# Patient Record
Sex: Female | Born: 1945 | Race: White | Hispanic: No | Marital: Married | Smoking: Former smoker
Health system: Southern US, Community
[De-identification: ages and names within clinical notes are randomized; demographics above are authoritative.]

## PROBLEM LIST (undated history)

## (undated) ENCOUNTER — Ambulatory Visit (HOSPITAL_BASED_OUTPATIENT_CLINIC_OR_DEPARTMENT_OTHER): Payer: Medicare PPO

## (undated) DIAGNOSIS — I639 Cerebral infarction, unspecified: Secondary | ICD-10-CM

## (undated) HISTORY — DX: Cerebral infarction, unspecified: I63.9

## (undated) HISTORY — PX: TONSILLECTOMY: SUR1361

## (undated) HISTORY — PX: OVARIAN CYST SURGERY: SHX726

## (undated) HISTORY — PX: BREAST EXCISIONAL BIOPSY: SUR124

## (undated) HISTORY — PX: CHOLECYSTECTOMY: SHX55

---

## 1985-03-01 HISTORY — PX: VAGINAL HYSTERECTOMY: SUR661

## 1999-08-12 ENCOUNTER — Encounter: Admission: RE | Admit: 1999-08-12 | Discharge: 1999-08-12 | Payer: Self-pay | Admitting: Surgery

## 1999-08-12 ENCOUNTER — Encounter: Payer: Self-pay | Admitting: Surgery

## 2000-08-30 ENCOUNTER — Encounter: Admission: RE | Admit: 2000-08-30 | Discharge: 2000-08-30 | Payer: Self-pay | Admitting: Surgery

## 2000-08-30 ENCOUNTER — Encounter: Payer: Self-pay | Admitting: Surgery

## 2001-09-04 ENCOUNTER — Encounter: Payer: Self-pay | Admitting: Surgery

## 2001-09-04 ENCOUNTER — Encounter: Admission: RE | Admit: 2001-09-04 | Discharge: 2001-09-04 | Payer: Self-pay | Admitting: Surgery

## 2001-09-22 ENCOUNTER — Other Ambulatory Visit: Admission: RE | Admit: 2001-09-22 | Discharge: 2001-09-22 | Payer: Self-pay | Admitting: Family Medicine

## 2002-09-06 ENCOUNTER — Encounter: Payer: Self-pay | Admitting: Surgery

## 2002-09-06 ENCOUNTER — Encounter: Admission: RE | Admit: 2002-09-06 | Discharge: 2002-09-06 | Payer: Self-pay | Admitting: Surgery

## 2003-09-12 ENCOUNTER — Encounter: Admission: RE | Admit: 2003-09-12 | Discharge: 2003-09-12 | Payer: Self-pay | Admitting: Surgery

## 2003-09-17 ENCOUNTER — Encounter: Admission: RE | Admit: 2003-09-17 | Discharge: 2003-09-17 | Payer: Self-pay | Admitting: Surgery

## 2004-03-01 HISTORY — PX: ROTATOR CUFF REPAIR: SHX139

## 2004-04-20 ENCOUNTER — Ambulatory Visit: Payer: Self-pay | Admitting: Family Medicine

## 2004-04-22 ENCOUNTER — Ambulatory Visit: Payer: Self-pay | Admitting: Family Medicine

## 2004-04-29 ENCOUNTER — Ambulatory Visit: Payer: Self-pay | Admitting: Family Medicine

## 2004-07-29 ENCOUNTER — Ambulatory Visit: Payer: Self-pay | Admitting: Family Medicine

## 2004-09-18 ENCOUNTER — Encounter: Admission: RE | Admit: 2004-09-18 | Discharge: 2004-09-18 | Payer: Self-pay | Admitting: Surgery

## 2004-10-08 ENCOUNTER — Ambulatory Visit: Payer: Self-pay | Admitting: Family Medicine

## 2005-03-08 ENCOUNTER — Ambulatory Visit: Payer: Self-pay | Admitting: Family Medicine

## 2005-04-06 ENCOUNTER — Ambulatory Visit: Payer: Self-pay | Admitting: Family Medicine

## 2005-09-27 ENCOUNTER — Encounter: Admission: RE | Admit: 2005-09-27 | Discharge: 2005-09-27 | Payer: Self-pay | Admitting: Surgery

## 2006-03-01 HISTORY — PX: ROTATOR CUFF REPAIR: SHX139

## 2006-09-29 ENCOUNTER — Encounter: Admission: RE | Admit: 2006-09-29 | Discharge: 2006-09-29 | Payer: Self-pay | Admitting: Surgery

## 2007-10-02 ENCOUNTER — Encounter: Admission: RE | Admit: 2007-10-02 | Discharge: 2007-10-02 | Payer: Self-pay | Admitting: Family Medicine

## 2008-10-02 ENCOUNTER — Encounter: Admission: RE | Admit: 2008-10-02 | Discharge: 2008-10-02 | Payer: Self-pay | Admitting: Family Medicine

## 2009-10-07 ENCOUNTER — Encounter: Admission: RE | Admit: 2009-10-07 | Discharge: 2009-10-07 | Payer: Self-pay | Admitting: Family Medicine

## 2010-09-07 ENCOUNTER — Other Ambulatory Visit (INDEPENDENT_AMBULATORY_CARE_PROVIDER_SITE_OTHER): Payer: Self-pay | Admitting: Surgery

## 2010-09-07 ENCOUNTER — Other Ambulatory Visit: Payer: Self-pay | Admitting: Family Medicine

## 2010-09-07 DIAGNOSIS — Z1231 Encounter for screening mammogram for malignant neoplasm of breast: Secondary | ICD-10-CM

## 2010-10-09 ENCOUNTER — Ambulatory Visit: Payer: Self-pay

## 2010-10-12 ENCOUNTER — Ambulatory Visit
Admission: RE | Admit: 2010-10-12 | Discharge: 2010-10-12 | Disposition: A | Discharge status: Home / Self Care | Payer: BC Managed Care – PPO | Source: Ambulatory Visit | Attending: Surgery | Admitting: Surgery

## 2010-10-12 DIAGNOSIS — Z1231 Encounter for screening mammogram for malignant neoplasm of breast: Secondary | ICD-10-CM

## 2011-09-27 ENCOUNTER — Other Ambulatory Visit: Payer: Self-pay | Admitting: Family Medicine

## 2011-09-27 DIAGNOSIS — Z1231 Encounter for screening mammogram for malignant neoplasm of breast: Secondary | ICD-10-CM

## 2011-10-13 ENCOUNTER — Ambulatory Visit
Admission: RE | Admit: 2011-10-13 | Discharge: 2011-10-13 | Disposition: A | Discharge status: Home / Self Care | Payer: BC Managed Care – PPO | Source: Ambulatory Visit | Attending: Family Medicine | Admitting: Family Medicine

## 2011-10-13 DIAGNOSIS — Z1231 Encounter for screening mammogram for malignant neoplasm of breast: Secondary | ICD-10-CM

## 2012-09-15 ENCOUNTER — Other Ambulatory Visit: Payer: Self-pay

## 2012-09-15 DIAGNOSIS — Z1231 Encounter for screening mammogram for malignant neoplasm of breast: Secondary | ICD-10-CM

## 2012-09-25 DIAGNOSIS — M5416 Radiculopathy, lumbar region: Secondary | ICD-10-CM

## 2012-09-25 HISTORY — DX: Radiculopathy, lumbar region: M54.16

## 2012-10-18 ENCOUNTER — Ambulatory Visit: Payer: BC Managed Care – PPO

## 2012-11-15 ENCOUNTER — Ambulatory Visit
Admission: RE | Admit: 2012-11-15 | Discharge: 2012-11-15 | Disposition: A | Discharge status: Home / Self Care | Payer: Medicare Other | Source: Ambulatory Visit

## 2012-11-15 DIAGNOSIS — Z1231 Encounter for screening mammogram for malignant neoplasm of breast: Secondary | ICD-10-CM

## 2013-10-11 ENCOUNTER — Other Ambulatory Visit: Payer: Self-pay

## 2013-10-11 DIAGNOSIS — Z1231 Encounter for screening mammogram for malignant neoplasm of breast: Secondary | ICD-10-CM

## 2013-11-16 ENCOUNTER — Encounter (INDEPENDENT_AMBULATORY_CARE_PROVIDER_SITE_OTHER): Payer: Self-pay

## 2013-11-16 ENCOUNTER — Ambulatory Visit
Admission: RE | Admit: 2013-11-16 | Discharge: 2013-11-16 | Disposition: A | Discharge status: Home / Self Care | Payer: 59 | Source: Ambulatory Visit

## 2013-11-16 DIAGNOSIS — Z1231 Encounter for screening mammogram for malignant neoplasm of breast: Secondary | ICD-10-CM

## 2014-10-22 ENCOUNTER — Other Ambulatory Visit: Payer: Self-pay

## 2014-10-22 DIAGNOSIS — Z1231 Encounter for screening mammogram for malignant neoplasm of breast: Secondary | ICD-10-CM

## 2014-11-06 ENCOUNTER — Other Ambulatory Visit: Payer: Self-pay | Admitting: Family Medicine

## 2014-11-06 DIAGNOSIS — N6315 Unspecified lump in the right breast, overlapping quadrants: Secondary | ICD-10-CM

## 2014-11-06 DIAGNOSIS — N644 Mastodynia: Secondary | ICD-10-CM

## 2014-11-06 DIAGNOSIS — N6310 Unspecified lump in the right breast, unspecified quadrant: Secondary | ICD-10-CM

## 2014-11-12 ENCOUNTER — Ambulatory Visit
Admission: RE | Admit: 2014-11-12 | Discharge: 2014-11-12 | Disposition: A | Discharge status: Home / Self Care | Payer: Medicare Other | Source: Ambulatory Visit | Attending: Family Medicine | Admitting: Family Medicine

## 2014-11-12 DIAGNOSIS — N644 Mastodynia: Secondary | ICD-10-CM

## 2014-11-12 DIAGNOSIS — N6315 Unspecified lump in the right breast, overlapping quadrants: Secondary | ICD-10-CM

## 2014-11-12 DIAGNOSIS — N6310 Unspecified lump in the right breast, unspecified quadrant: Secondary | ICD-10-CM

## 2014-11-19 ENCOUNTER — Ambulatory Visit: Payer: Self-pay

## 2015-05-18 DIAGNOSIS — E782 Mixed hyperlipidemia: Secondary | ICD-10-CM | POA: Insufficient documentation

## 2015-05-18 HISTORY — DX: Mixed hyperlipidemia: E78.2

## 2015-05-21 DIAGNOSIS — G4700 Insomnia, unspecified: Secondary | ICD-10-CM

## 2015-05-21 HISTORY — DX: Insomnia, unspecified: G47.00

## 2015-05-22 DIAGNOSIS — M5136 Other intervertebral disc degeneration, lumbar region: Secondary | ICD-10-CM

## 2015-05-22 DIAGNOSIS — G894 Chronic pain syndrome: Secondary | ICD-10-CM

## 2015-05-22 DIAGNOSIS — M51369 Other intervertebral disc degeneration, lumbar region without mention of lumbar back pain or lower extremity pain: Secondary | ICD-10-CM

## 2015-05-22 HISTORY — DX: Other intervertebral disc degeneration, lumbar region: M51.36

## 2015-05-22 HISTORY — DX: Other intervertebral disc degeneration, lumbar region without mention of lumbar back pain or lower extremity pain: M51.369

## 2015-05-22 HISTORY — DX: Chronic pain syndrome: G89.4

## 2015-07-21 DIAGNOSIS — D509 Iron deficiency anemia, unspecified: Secondary | ICD-10-CM

## 2015-07-21 DIAGNOSIS — I10 Essential (primary) hypertension: Secondary | ICD-10-CM

## 2015-07-21 DIAGNOSIS — G454 Transient global amnesia: Secondary | ICD-10-CM

## 2015-07-21 HISTORY — DX: Iron deficiency anemia, unspecified: D50.9

## 2015-07-21 HISTORY — DX: Transient global amnesia: G45.4

## 2015-07-21 HISTORY — DX: Essential (primary) hypertension: I10

## 2015-09-15 DIAGNOSIS — G589 Mononeuropathy, unspecified: Secondary | ICD-10-CM

## 2015-09-15 DIAGNOSIS — I739 Peripheral vascular disease, unspecified: Secondary | ICD-10-CM

## 2015-09-15 DIAGNOSIS — J309 Allergic rhinitis, unspecified: Secondary | ICD-10-CM

## 2015-09-15 DIAGNOSIS — K5730 Diverticulosis of large intestine without perforation or abscess without bleeding: Secondary | ICD-10-CM | POA: Insufficient documentation

## 2015-09-15 DIAGNOSIS — N6019 Diffuse cystic mastopathy of unspecified breast: Secondary | ICD-10-CM

## 2015-09-15 DIAGNOSIS — Z13820 Encounter for screening for osteoporosis: Secondary | ICD-10-CM

## 2015-09-15 DIAGNOSIS — Z0000 Encounter for general adult medical examination without abnormal findings: Secondary | ICD-10-CM

## 2015-09-15 DIAGNOSIS — M129 Arthropathy, unspecified: Secondary | ICD-10-CM

## 2015-09-15 HISTORY — DX: Encounter for screening for osteoporosis: Z13.820

## 2015-09-15 HISTORY — DX: Arthropathy, unspecified: M12.9

## 2015-09-15 HISTORY — DX: Mononeuropathy, unspecified: G58.9

## 2015-09-15 HISTORY — DX: Encounter for general adult medical examination without abnormal findings: Z00.00

## 2015-09-15 HISTORY — DX: Peripheral vascular disease, unspecified: I73.9

## 2015-09-15 HISTORY — DX: Diverticulosis of large intestine without perforation or abscess without bleeding: K57.30

## 2015-09-15 HISTORY — DX: Allergic rhinitis, unspecified: J30.9

## 2015-09-15 HISTORY — DX: Diffuse cystic mastopathy of unspecified breast: N60.19

## 2015-10-14 ENCOUNTER — Other Ambulatory Visit: Payer: Self-pay | Admitting: Family Medicine

## 2015-10-14 DIAGNOSIS — Z1231 Encounter for screening mammogram for malignant neoplasm of breast: Secondary | ICD-10-CM

## 2015-11-14 ENCOUNTER — Ambulatory Visit: Payer: Medicare Other

## 2015-11-21 ENCOUNTER — Ambulatory Visit
Admission: RE | Admit: 2015-11-21 | Discharge: 2015-11-21 | Disposition: A | Discharge status: Home / Self Care | Payer: Medicare Other | Source: Ambulatory Visit | Attending: Family Medicine | Admitting: Family Medicine

## 2015-11-21 DIAGNOSIS — Z1231 Encounter for screening mammogram for malignant neoplasm of breast: Secondary | ICD-10-CM

## 2016-03-21 DIAGNOSIS — E669 Obesity, unspecified: Secondary | ICD-10-CM

## 2016-03-21 HISTORY — DX: Obesity, unspecified: E66.9

## 2016-10-08 DIAGNOSIS — J392 Other diseases of pharynx: Secondary | ICD-10-CM

## 2016-10-08 HISTORY — DX: Other diseases of pharynx: J39.2

## 2016-10-11 ENCOUNTER — Other Ambulatory Visit: Payer: Self-pay | Admitting: Family Medicine

## 2016-10-11 DIAGNOSIS — Z1231 Encounter for screening mammogram for malignant neoplasm of breast: Secondary | ICD-10-CM

## 2016-11-22 ENCOUNTER — Ambulatory Visit
Admission: RE | Admit: 2016-11-22 | Discharge: 2016-11-22 | Disposition: A | Discharge status: Home / Self Care | Payer: Medicare Other | Source: Ambulatory Visit | Attending: Family Medicine | Admitting: Family Medicine

## 2016-11-22 DIAGNOSIS — Z1231 Encounter for screening mammogram for malignant neoplasm of breast: Secondary | ICD-10-CM

## 2017-10-10 ENCOUNTER — Other Ambulatory Visit: Payer: Self-pay | Admitting: Family Medicine

## 2017-10-10 DIAGNOSIS — Z1231 Encounter for screening mammogram for malignant neoplasm of breast: Secondary | ICD-10-CM

## 2017-10-12 ENCOUNTER — Other Ambulatory Visit: Payer: Self-pay | Admitting: Family Medicine

## 2017-10-12 DIAGNOSIS — N959 Unspecified menopausal and perimenopausal disorder: Secondary | ICD-10-CM

## 2017-10-13 ENCOUNTER — Other Ambulatory Visit: Payer: Self-pay | Admitting: Family Medicine

## 2017-10-13 DIAGNOSIS — E2839 Other primary ovarian failure: Secondary | ICD-10-CM

## 2017-11-23 ENCOUNTER — Ambulatory Visit
Admission: RE | Admit: 2017-11-23 | Discharge: 2017-11-23 | Disposition: A | Discharge status: Home / Self Care | Payer: Medicare Other | Source: Ambulatory Visit | Attending: Family Medicine | Admitting: Family Medicine

## 2017-11-23 DIAGNOSIS — Z1231 Encounter for screening mammogram for malignant neoplasm of breast: Secondary | ICD-10-CM

## 2017-12-07 ENCOUNTER — Other Ambulatory Visit: Payer: Medicare Other

## 2018-04-12 DIAGNOSIS — Z8249 Family history of ischemic heart disease and other diseases of the circulatory system: Secondary | ICD-10-CM

## 2018-04-12 HISTORY — DX: Family history of ischemic heart disease and other diseases of the circulatory system: Z82.49

## 2018-10-18 ENCOUNTER — Other Ambulatory Visit: Payer: Self-pay | Admitting: Family Medicine

## 2018-10-18 DIAGNOSIS — Z1231 Encounter for screening mammogram for malignant neoplasm of breast: Secondary | ICD-10-CM

## 2018-10-20 DIAGNOSIS — R7303 Prediabetes: Secondary | ICD-10-CM

## 2018-10-20 HISTORY — DX: Prediabetes: R73.03

## 2018-10-25 DIAGNOSIS — R0600 Dyspnea, unspecified: Secondary | ICD-10-CM

## 2018-10-25 DIAGNOSIS — R0609 Other forms of dyspnea: Secondary | ICD-10-CM | POA: Insufficient documentation

## 2018-10-25 HISTORY — DX: Dyspnea, unspecified: R06.00

## 2018-10-25 HISTORY — DX: Other forms of dyspnea: R06.09

## 2018-12-05 ENCOUNTER — Ambulatory Visit
Admission: RE | Admit: 2018-12-05 | Discharge: 2018-12-05 | Disposition: A | Discharge status: Home / Self Care | Payer: Medicare Other | Source: Ambulatory Visit | Attending: Family Medicine | Admitting: Family Medicine

## 2018-12-05 ENCOUNTER — Other Ambulatory Visit: Payer: Self-pay

## 2018-12-05 DIAGNOSIS — Z1231 Encounter for screening mammogram for malignant neoplasm of breast: Secondary | ICD-10-CM

## 2018-12-06 ENCOUNTER — Other Ambulatory Visit: Payer: Self-pay | Admitting: Family Medicine

## 2018-12-06 DIAGNOSIS — R928 Other abnormal and inconclusive findings on diagnostic imaging of breast: Secondary | ICD-10-CM

## 2018-12-06 DIAGNOSIS — L0291 Cutaneous abscess, unspecified: Secondary | ICD-10-CM

## 2018-12-08 ENCOUNTER — Ambulatory Visit: Payer: Medicare Other

## 2018-12-08 ENCOUNTER — Other Ambulatory Visit: Payer: Medicare Other

## 2018-12-08 ENCOUNTER — Other Ambulatory Visit: Payer: Self-pay

## 2018-12-08 ENCOUNTER — Ambulatory Visit
Admission: RE | Admit: 2018-12-08 | Discharge: 2018-12-08 | Disposition: A | Discharge status: Home / Self Care | Payer: Medicare Other | Source: Ambulatory Visit | Attending: Family Medicine | Admitting: Family Medicine

## 2018-12-08 DIAGNOSIS — R928 Other abnormal and inconclusive findings on diagnostic imaging of breast: Secondary | ICD-10-CM

## 2019-01-15 DIAGNOSIS — B029 Zoster without complications: Secondary | ICD-10-CM

## 2019-01-15 HISTORY — DX: Zoster without complications: B02.9

## 2019-05-04 ENCOUNTER — Encounter: Payer: Self-pay | Admitting: *Deleted

## 2019-05-04 ENCOUNTER — Encounter: Payer: Self-pay | Admitting: Cardiology

## 2019-05-08 ENCOUNTER — Encounter (INDEPENDENT_AMBULATORY_CARE_PROVIDER_SITE_OTHER): Payer: Self-pay

## 2019-05-08 ENCOUNTER — Encounter: Payer: Self-pay | Admitting: Cardiology

## 2019-05-08 ENCOUNTER — Ambulatory Visit: Payer: Medicare PPO | Admitting: Cardiology

## 2019-05-08 ENCOUNTER — Other Ambulatory Visit: Payer: Self-pay

## 2019-05-08 VITALS — BP 160/70 | HR 60 | Ht 65.0 in | Wt 207.0 lb

## 2019-05-08 DIAGNOSIS — E663 Overweight: Secondary | ICD-10-CM

## 2019-05-08 DIAGNOSIS — I10 Essential (primary) hypertension: Secondary | ICD-10-CM

## 2019-05-08 DIAGNOSIS — R0600 Dyspnea, unspecified: Secondary | ICD-10-CM | POA: Diagnosis not present

## 2019-05-08 DIAGNOSIS — G454 Transient global amnesia: Secondary | ICD-10-CM | POA: Diagnosis not present

## 2019-05-08 DIAGNOSIS — R7303 Prediabetes: Secondary | ICD-10-CM | POA: Diagnosis not present

## 2019-05-08 DIAGNOSIS — R0609 Other forms of dyspnea: Secondary | ICD-10-CM

## 2019-05-08 DIAGNOSIS — R0989 Other specified symptoms and signs involving the circulatory and respiratory systems: Secondary | ICD-10-CM

## 2019-05-08 DIAGNOSIS — R011 Cardiac murmur, unspecified: Secondary | ICD-10-CM

## 2019-05-08 HISTORY — DX: Overweight: E66.3

## 2019-05-08 HISTORY — DX: Essential (primary) hypertension: I10

## 2019-05-08 MED ORDER — LOSARTAN POTASSIUM 100 MG PO TABS: 50.0000 mg | ORAL_TABLET | Freq: Every day | ORAL | 3 refills | Status: DC

## 2019-05-08 NOTE — Patient Instructions (Signed)
Medication Instructions:  Your physician has recommended you make the following change in your medication:   Stop Hydralazine Start Losartan 100 mg take 0.5 (1/2) tablet daily.  *If you need a refill on your cardiac medications before your next appointment, please call your pharmacy*   Lab Work: You need to have labs done in 2 weeks.  You can come Monday through Friday 8:30 am to 12:00 pm and 1:15 to 4:30. You do not need to make an appointment as the order has already been placed. The labs you are going to have done is a  BMET. If you have labs (blood work) drawn today and your tests are completely normal, you will receive your results only by: Marland Kitchen MyChart Message (if you have MyChart) OR . A paper copy in the mail If you have any lab test that is abnormal or we need to change your treatment, we will call you to review the results.   Testing/Procedures: Your physician has requested that you have an echocardiogram. Echocardiography is a painless test that uses sound waves to create images of your heart. It provides your doctor with information about the size and shape of your heart and how well your heart's chambers and valves are working. This procedure takes approximately one hour. There are no restrictions for this procedure.  Your physician has requested that you have a lexiscan myoview. For further information please visit [email or web address]. Please follow instruction sheet, as given.  The test will take approximately 3 to 4 hours to complete; you may bring reading material.  If someone comes with you to your appointment, they will need to remain in the main lobby due to limited space in the testing area. **If you are pregnant or breastfeeding, please notify the nuclear lab prior to your appointment**  How to prepare for your Myocardial Perfusion Test: . Do not eat or drink 3 hours prior to your test, except you may have water. . Do not consume products containing caffeine (regular or  decaffeinated) 12 hours prior to your test. (ex: coffee, chocolate, sodas, tea). Do not take metoprolol (Lopressor, Toprol) for 24 hours prior to the test.  Bring the medication to your appointment as you may be required to take it once the test is complete. . Do wear comfortable clothes (no dresses or overalls) and walking shoes, tennis shoes preferred (No heels or open toe shoes are allowed). . Do NOT wear cologne, perfume, aftershave, or lotions (deodorant is allowed). . If these instructions are not followed, your test will have to be rescheduled.    Follow-Up: At [facility], you and your health needs are our priority.  As part of our continuing mission to provide you with exceptional heart care, we have created designated Provider Care Teams.  These Care Teams include your primary Cardiologist (physician) and Advanced Practice Providers (APPs -  Physician Assistants and Nurse Practitioners) who all work together to provide you with the care you need, when you need it.  We recommend signing up for the patient portal called "MyChart".  Sign up information is provided on this After Visit Summary.  MyChart is used to connect with patients for Virtual Visits (Telemedicine).  Patients are able to view lab/test results, encounter notes, upcoming appointments, etc.  Non-urgent messages can be sent to your provider as well.   To learn more about what you can do with MyChart, go to [email or web address].    Your next appointment:   2 month(s)  The format for your next appointment:  In Person  Provider:   Jyl Heinz, MD   Other Instructions  Echocardiogram An echocardiogram is a procedure that uses painless sound waves (ultrasound) to produce an image of the heart. Images from an echocardiogram can provide important information about:  Signs of coronary artery disease (CAD).  Aneurysm detection. An aneurysm is a weak or damaged part of an artery wall that bulges out from the  normal force of blood pumping through the body.  Heart size and shape. Changes in the size or shape of the heart can be associated with certain conditions, including heart failure, aneurysm, and CAD.  Heart muscle function.  Heart valve function.  Signs of a past heart attack.  Fluid buildup around the heart.  Thickening of the heart muscle.  A tumor or infectious growth around the heart valves. Tell a health care provider about:  Any allergies you have.  All medicines you are taking, including vitamins, herbs, eye drops, creams, and over-the-counter medicines.  Any blood disorders you have.  Any surgeries you have had.  Any medical conditions you have.  Whether you are pregnant or may be pregnant. What are the risks? Generally, this is a safe procedure. However, problems may occur, including:  Allergic reaction to dye (contrast) that may be used during the procedure. What happens before the procedure? No specific preparation is needed. You may eat and drink normally. What happens during the procedure?   An IV tube may be inserted into one of your veins.  You may receive contrast through this tube. A contrast is an injection that improves the quality of the pictures from your heart.  A gel will be applied to your chest.  A wand-like tool (transducer) will be moved over your chest. The gel will help to transmit the sound waves from the transducer.  The sound waves will harmlessly bounce off of your heart to allow the heart images to be captured in real-time motion. The images will be recorded on a computer. The procedure may vary among health care providers and hospitals. What happens after the procedure?  You may return to your normal, everyday life, including diet, activities, and medicines, unless your health care provider tells you not to do that. Summary  An echocardiogram is a procedure that uses painless sound waves (ultrasound) to produce an image of the  heart.  Images from an echocardiogram can provide important information about the size and shape of your heart, heart muscle function, heart valve function, and fluid buildup around your heart.  You do not need to do anything to prepare before this procedure. You may eat and drink normally.  After the echocardiogram is completed, you may return to your normal, everyday life, unless your health care provider tells you not to do that. This information is not intended to replace advice given to you by your health care provider. Make sure you discuss any questions you have with your health care provider. Document Revised: 06/08/2018 Document Reviewed: 03/20/2016 Elsevier Patient Education  [place].

## 2019-05-08 NOTE — Progress Notes (Signed)
Cardiology Office Note:    Date:  05/08/2019   ID:  Rhodia Albright [place], [place] 09/14/1945, MRN [id number]  PCP:  Algis Greenhouse, MD  Cardiologist:  Jenean Lindau, MD   Referring MD: Algis Greenhouse, MD    ASSESSMENT:    1. Dyspnea on exertion   2. Transient global amnesia   3. Prediabetes   4. Essential hypertension   5. Overweight    PLAN:    In order of problems listed above:  1. Primary prevention stressed with the patient.  Importance of compliance with diet medication stressed and she vocalized understanding.  Importance of regular exercise to the best of her ability weight reduction was stressed.  Diet was emphasized. 2. Essential hypertension: Blood pressure is mildly elevated.  She is taking hydralazine 4 times a day which is kind of cumbersome and difficult for her so I will switch this medication to losartan and will start with 100 mg half tablet daily.  She will be back in 2 weeks for Chem-7.  At that time she will bring in a log of her blood pressure checks. 3. Cardiac murmur: Echocardiogram will be done to assess murmur on auscultation. 4. Shortness of breath on exertion: Patient will undergo Lexiscan sestamibi to assess the symptoms. 5. Bilateral carotid bruits: Doppler ultrasound will be done to assess this.  She has history of TIAs in the past. 6. Mixed dyslipidemia: Patient on statin therapy diet emphasized and lipids followed by primary care physician. 7. Patient will be seen in follow-up appointment in 2 months or earlier if the patient has any concerns    Medication Adjustments/Labs and Tests Ordered: Current medicines are reviewed at length with the patient today.  Concerns regarding medicines are outlined above.  Orders Placed This Encounter  Procedures  . Ambulatory referral to Cardiology   No orders of the defined types were placed in this encounter.    History of Present Illness:    Alisha Cochran is a 74 y.o. female who is being seen  today for the evaluation of essential hypertension at the request of Dough, Jaymes Graff, MD.  Patient is a pleasant 74 year old female.  She has past medical history of essential hypertension dyslipidemia and diet-controlled diabetes mellitus.  She is overweight.  She has had history of TIAs in the past.  She does not exercise much because of orthopedic issues with her back.  She denies any chest pain orthopnea or PND.  Patient does mention to me that she has shortness of breath on exertion.  Past Medical History:  Diagnosis Date  . Allergic rhinitis 09/15/2015  . Anemia, iron deficiency 07/21/2015  . Arthropathy 09/15/2015  . Benign hypertension 07/21/2015  . Chronic pain syndrome 05/22/2015  . Diverticulosis of colon 09/15/2015   2014, 2020: diverticulitis  . Dyspnea on exertion 10/25/2018   2020  . Family history of premature coronary artery disease 04/12/2018  . Fibrocystic breast disease 09/15/2015  . Herpes zoster without complication 123456   2020: left T12  . Hyperlipemia, mixed 05/18/2015  . Insomnia 05/21/2015  . 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 Obesity (BMI 30-39.9) 03/21/2016  . 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 Peripheral arterial disease ([place]) 09/15/2015   Overview:  2009: right CFA 50%, asx 2017: insurance nurse left 0.86 right 0.94  . Pharyngeal lesion 10/08/2016   Overview:  2018: right  . Prediabetes 10/20/2018  . Radiculopathy of lumbar region 09/25/2012   Overview:  2008: onset 2009: MRI DDD with tear and protrusion L5-S1, facet arthropathy L5-S1 2014: MRI L4-5 DDD with progression of L5-S1 changes 2014: Pain eval, limited  . Stroke ([place])   . Transient global amnesia 07/21/2015    Past Surgical History:  Procedure Laterality Date  . BREAST EXCISIONAL BIOPSY Right    x2  . BREAST EXCISIONAL BIOPSY Left    x2  . CHOLECYSTECTOMY    . OVARIAN CYST SURGERY    . ROTATOR CUFF REPAIR Right 2008  . ROTATOR CUFF REPAIR  Left 2006  . TONSILLECTOMY    . VAGINAL HYSTERECTOMY  1987    Current Medications: Current Meds  Medication Sig  . amLODipine (NORVASC) 5 MG tablet Take 5 mg by mouth daily.  Marland Kitchen aspirin 81 MG EC tablet Take 81 mg by mouth daily.  Marland Kitchen doxazosin (CARDURA) 2 MG tablet 2 mg every evening.  . metoprolol tartrate (LOPRESSOR) 50 MG tablet 50 mg every 12 (twelve) hours.  . rosuvastatin (CRESTOR) 40 MG tablet 40 mg daily.  . [DISCONTINUED] hydrALAZINE (APRESOLINE) 50 MG tablet Take 50 mg by mouth in the morning, at noon, in the evening, and at bedtime.     Allergies:   Lisinopril   Social History   Socioeconomic History  . Marital status: Married    Spouse name: Not on file  . Number of children: Not on file  . Years of education: Not on file  . Highest education level: Not on file  Occupational History  . Not on file  Tobacco Use  . Smoking status: Former Smoker    Types: Cigarettes    Start date: 1966    Quit date: 03/01/1978    Years since quitting: 41.2  . Smokeless tobacco: Never Used  Substance and Sexual Activity  . Alcohol use: Yes    Alcohol/week: 14.0 standard drinks    Types: 14 Glasses of wine per week  . Drug use: Never  . Sexual activity: Not on file  Other Topics Concern  . Not on file  Social History Narrative  . Not on file   Social Determinants of Health   Financial Resource Strain:   . Difficulty of Paying Living Expenses: Not on file  Food Insecurity:   . Worried About Charity fundraiser in the Last Year: Not on file  . Ran Out of Food in the Last Year: Not on file  Transportation Needs:   . Lack of Transportation (Medical): Not on file  . Lack of Transportation (Non-Medical): Not on file  Physical Activity:   . Days of Exercise per Week: Not on file  . Minutes of Exercise per Session: Not on file  Stress:   . Feeling of Stress : Not on file  Social Connections:   . Frequency of Communication with Friends and Family: Not on file  . Frequency of  Social Gatherings with Friends and Family: Not on file  . Attends Religious Services: Not on file  . Active Member of Clubs or Organizations: Not on file  . Attends Archivist Meetings: Not on file  . Marital Status: Not on file     Family History: The patient's family history includes Breast cancer in her maternal grandmother, mother, and paternal grandmother; Heart attack in her paternal grandfather and paternal grandmother; Heart disease in her father.  ROS:   Please see the history of present illness.    All other systems reviewed and are negative.  EKGs/Labs/Other Studies Reviewed:    The following  studies were reviewed today: EKG reveals sinus rhythm and nonspecific ST-T changes.   Recent Labs: No results found for requested labs within last 8760 hours.  Recent Lipid Panel No results found for: CHOL, TRIG, HDL, CHOLHDL, VLDL, LDLCALC, LDLDIRECT  Physical Exam:    VS:  BP (!) 160/70   Pulse 60   Ht 5\' 5"  (1.651 m)   Wt 207 lb (93.9 kg)   SpO2 98%   BMI 34.45 kg/m     Wt Readings from Last 3 Encounters:  05/08/19 207 lb (93.9 kg)     GEN: Patient is in no acute distress HEENT: Normal NECK: No JVD; No carotid bruits LYMPHATICS: No lymphadenopathy CARDIAC: S1 S2 regular, 2/6 systolic murmur at the apex. RESPIRATORY:  Clear to auscultation without rales, wheezing or rhonchi  ABDOMEN: Soft, non-tender, non-distended MUSCULOSKELETAL:  No edema; No deformity  SKIN: Warm and dry NEUROLOGIC:  Alert and oriented x 3 PSYCHIATRIC:  Normal affect    Signed, Jenean Lindau, MD  05/08/2019 3:03 PM    [facility]

## 2019-05-24 LAB — BASIC METABOLIC PANEL
BUN/Creatinine Ratio: 16 (ref 12–28)
BUN: 14 mg/dL (ref 8–27)
CO2: 24 mmol/L (ref 20–29)
Calcium: 9.5 mg/dL (ref 8.7–10.3)
Chloride: 101 mmol/L (ref 96–106)
Creatinine, Ser: 0.86 mg/dL (ref 0.57–1.00)
GFR calc Af Amer: 79 mL/min/{1.73_m2} (ref >59)
GFR calc non Af Amer: 68 mL/min/{1.73_m2} (ref >59)
Glucose: 108 mg/dL — ABNORMAL HIGH (ref 65–99)
Potassium: 4.7 mmol/L (ref 3.5–5.2)
Sodium: 138 mmol/L (ref 134–144)

## 2019-06-14 ENCOUNTER — Telehealth (HOSPITAL_COMMUNITY): Payer: Self-pay | Admitting: *Deleted

## 2019-06-14 NOTE — Telephone Encounter (Signed)
Patient given detailed instructions per Myocardial Perfusion Study Information Sheet for the test on 06/21/19 at 11:00. Patient notified to arrive 15 minutes early and that it is imperative to arrive on time for appointment to keep from having the test rescheduled.  If you need to cancel or reschedule your appointment, please call the office within 24 hours of your appointment. . Patient verbalized understanding.Alisha Cochran

## 2019-06-20 ENCOUNTER — Ambulatory Visit (INDEPENDENT_AMBULATORY_CARE_PROVIDER_SITE_OTHER): Payer: Medicare PPO

## 2019-06-20 ENCOUNTER — Other Ambulatory Visit: Payer: Self-pay

## 2019-06-20 DIAGNOSIS — R011 Cardiac murmur, unspecified: Secondary | ICD-10-CM | POA: Diagnosis not present

## 2019-06-20 DIAGNOSIS — R0989 Other specified symptoms and signs involving the circulatory and respiratory systems: Secondary | ICD-10-CM

## 2019-06-20 NOTE — Progress Notes (Signed)
Carotid duplex has been performed.  Jimmy Diedre Maclellan RDCS, RVT

## 2019-06-20 NOTE — Progress Notes (Signed)
Complete echocardiogram has been performed.  Jimmy Simra Fiebig RDCS, RVT 

## 2019-06-21 ENCOUNTER — Ambulatory Visit (INDEPENDENT_AMBULATORY_CARE_PROVIDER_SITE_OTHER): Payer: Medicare PPO

## 2019-06-21 VITALS — Ht 65.0 in | Wt 207.0 lb

## 2019-06-21 DIAGNOSIS — R0600 Dyspnea, unspecified: Secondary | ICD-10-CM

## 2019-06-21 DIAGNOSIS — R0609 Other forms of dyspnea: Secondary | ICD-10-CM

## 2019-06-21 LAB — MYOCARDIAL PERFUSION IMAGING
LV dias vol: 66 mL (ref 46–106)
LV sys vol: 18 mL
Peak HR: 108 {beats}/min
Rest HR: 72 {beats}/min
SDS: 0
SRS: 0
SSS: 0
TID: 1.16

## 2019-06-21 MED ORDER — REGADENOSON 0.4 MG/5ML IV SOLN
0.4000 mg | Freq: Once | INTRAVENOUS | Status: AC
  Administered 2019-06-21: 0.4 mg via INTRAVENOUS

## 2019-06-21 MED ORDER — TECHNETIUM TC 99M TETROFOSMIN IV KIT
32.3000 | PACK | Freq: Once | INTRAVENOUS | Status: AC | PRN
  Administered 2019-06-21: 32.3 via INTRAVENOUS

## 2019-06-21 MED ORDER — TECHNETIUM TC 99M TETROFOSMIN IV KIT
9.6000 | PACK | Freq: Once | INTRAVENOUS | Status: AC | PRN
  Administered 2019-06-21: 9.6 via INTRAVENOUS

## 2019-06-22 DIAGNOSIS — I6522 Occlusion and stenosis of left carotid artery: Secondary | ICD-10-CM

## 2019-06-22 HISTORY — DX: Occlusion and stenosis of left carotid artery: I65.22

## 2019-07-11 ENCOUNTER — Other Ambulatory Visit: Payer: Self-pay

## 2019-07-12 ENCOUNTER — Ambulatory Visit: Payer: Medicare PPO | Admitting: Cardiology

## 2019-07-12 ENCOUNTER — Other Ambulatory Visit: Payer: Self-pay

## 2019-07-12 ENCOUNTER — Encounter: Payer: Self-pay | Admitting: Cardiology

## 2019-07-12 VITALS — BP 134/60 | HR 66 | Ht 65.0 in | Wt 202.0 lb

## 2019-07-12 DIAGNOSIS — I10 Essential (primary) hypertension: Secondary | ICD-10-CM

## 2019-07-12 NOTE — Progress Notes (Signed)
Cardiology Office Note:    Date:  07/12/2019   ID:  Alisha Cochran Alisha Cochran, [place] 1945/08/13, MRN [id number]  PCP:  Algis Greenhouse, MD  Cardiologist:  Jenean Lindau, MD   Referring MD: Algis Greenhouse, MD    ASSESSMENT:    No diagnosis found. PLAN:    In order of problems listed above:  1. Primary prevention stressed with the patient.  Importance of compliance with diet medication stressed and she vocalized understanding.  Importance of regular exercise stressed and weight reduction stressed.  Diet was emphasized. 2. Essential hypertension: Blood pressure is stable.  Blood multiple blood pressure readings from home and they are fine. 3. Mixed dyslipidemia: Lipids managed by primary care physician.  She is on statin therapy. 4. Because of multiple medication she will have a Chem-7 today. 5. Patient will be seen in follow-up appointment in 6 months or earlier if the patient has any concerns    Medication Adjustments/Labs and Tests Ordered: Current medicines are reviewed at length with the patient today.  Concerns regarding medicines are outlined above.  No orders of the defined types were placed in this encounter.  No orders of the defined types were placed in this encounter.    No chief complaint on file.    History of Present Illness:    Alisha Cochran is a 74 y.o. female.  Patient has past medical history of essential hypertension, vascular atherosclerosis and dyslipidemia.  She denies any problems at this time and takes care of activities of daily living.  No chest pain orthopnea or PND.  At the time of my evaluation, the patient is alert awake oriented and in no distress.  Past Medical History:  Diagnosis Date  . Allergic rhinitis 09/15/2015  . Anemia, iron deficiency 07/21/2015  . Arthropathy 09/15/2015  . Benign hypertension 07/21/2015  . Chronic pain syndrome 05/22/2015  . Diverticulosis of colon 09/15/2015   2014, 2020: diverticulitis  . Dyspnea on exertion  10/25/2018   2020  . Family history of premature coronary artery disease 04/12/2018  . Fibrocystic breast disease 09/15/2015  . Herpes zoster without complication [id number]   2020: left T12  . Hyperlipemia, mixed 05/18/2015  . Insomnia 05/21/2015  . 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 Obesity (BMI 30-39.9) 03/21/2016  . 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 Peripheral arterial disease ([place]) 09/15/2015   Overview:  2009: right CFA 50%, asx 2017: insurance nurse left 0.86 right 0.94  . Pharyngeal lesion 10/08/2016   Overview:  2018: right  . Prediabetes 10/20/2018  . Radiculopathy of lumbar region 09/25/2012   Overview:  2008: onset 2009: MRI DDD with tear and protrusion L5-S1, facet arthropathy L5-S1 2014: MRI L4-5 DDD with progression of L5-S1 changes 2014: Pain eval, limited  . Stroke ([place])   . Transient global amnesia 07/21/2015    Past Surgical History:  Procedure Laterality Date  . BREAST EXCISIONAL BIOPSY Right    x2  . BREAST EXCISIONAL BIOPSY Left    x2  . CHOLECYSTECTOMY    . OVARIAN CYST SURGERY    . ROTATOR CUFF REPAIR Right 2008  . ROTATOR CUFF REPAIR Left 2006  . TONSILLECTOMY    . VAGINAL HYSTERECTOMY  1987    Current Medications: Current Meds  Medication Sig  . amLODipine (NORVASC) 5 MG tablet Take 5 mg by mouth daily.  Marland Kitchen aspirin 81 MG EC tablet Take 81 mg by mouth daily.  . calcium-vitamin D (OSCAL  WITH D) 500-200 MG-UNIT TABS tablet Take by mouth daily. Take 2 tablets daily  . doxazosin (CARDURA) 2 MG tablet 2 mg every evening.  Marland Kitchen losartan (COZAAR) 100 MG tablet Take 0.5 tablets (50 mg total) by mouth daily.  . metoprolol tartrate (LOPRESSOR) 50 MG tablet 50 mg every 12 (twelve) hours.  . Multiple Vitamin (MULTI-VITAMIN) tablet Take by mouth.  . rosuvastatin (CRESTOR) 40 MG tablet 40 mg daily.     Allergies:   Lisinopril   Social History   Socioeconomic History  . Marital status: Married    Spouse name:  Not on file  . Number of children: Not on file  . Years of education: Not on file  . Highest education level: Not on file  Occupational History  . Not on file  Tobacco Use  . Smoking status: Former Smoker    Types: Cigarettes    Start date: 1966    Quit date: 03/01/1978    Years since quitting: 41.3  . Smokeless tobacco: Never Used  Substance and Sexual Activity  . Alcohol use: Yes    Alcohol/week: 14.0 standard drinks    Types: 14 Glasses of wine per week  . Drug use: Never  . Sexual activity: Not on file  Other Topics Concern  . Not on file  Social History Narrative  . Not on file   Social Determinants of Health   Financial Resource Strain:   . Difficulty of Paying Living Expenses:   Food Insecurity:   . Worried About Charity fundraiser in the Last Year:   . Arboriculturist in the Last Year:   Transportation Needs:   . Film/video editor (Medical):   Marland Kitchen Lack of Transportation (Non-Medical):   Physical Activity:   . Days of Exercise per Week:   . Minutes of Exercise per Session:   Stress:   . Feeling of Stress :   Social Connections:   . Frequency of Communication with Friends and Family:   . Frequency of Social Gatherings with Friends and Family:   . Attends Religious Services:   . Active Member of Clubs or Organizations:   . Attends Archivist Meetings:   Marland Kitchen Marital Status:      Family History: The patient's family history includes Breast cancer in her maternal grandmother, mother, and paternal grandmother; Heart attack in her paternal grandfather and paternal grandmother; Heart disease in her father.  ROS:   Please see the history of present illness.    All other systems reviewed and are negative.  EKGs/Labs/Other Studies Reviewed:    The following studies were reviewed today: Study Highlights   The left ventricular ejection fraction is hyperdynamic (>65%).  Nuclear stress EF: 73%.  There was no ST segment deviation noted during  stress.  The study is normal.  This is a low risk study.   Summary:  Right Carotid: There is no evidence of stenosis in the right ICA. The         extracranial vessels were near-normal with only minimal  wall         thickening or plaque.   Left Carotid: Velocities in the left ICA are consistent with a 1-39%  stenosis.   Vertebrals: Bilateral vertebral arteries demonstrate antegrade flow.  Subclavians: Normal flow hemodynamics were seen in bilateral subclavian        arteries.   *See table(s) above for measurements and observations.    IMPRESSIONS    1. Left ventricular ejection fraction, by  estimation, is 60 to 65%. The  left ventricle has normal function. The left ventricle has no regional  wall motion abnormalities. There is mild concentric left ventricular  hypertrophy. Left ventricular diastolic  parameters are consistent with age-related delayed relaxation (normal).  2. Right ventricular systolic function is normal. The right ventricular  size is normal. There is mildly elevated pulmonary artery systolic  pressure.  3. The mitral valve is normal in structure. No evidence of mitral valve  regurgitation. No evidence of mitral stenosis.  4. The aortic valve is normal in structure. Aortic valve regurgitation is  not visualized. No aortic stenosis is present.  5. The inferior vena cava is normal in size with greater than 50%  respiratory variability, suggesting right atrial pressure of 3 mmHg.    Recent Labs: 05/23/2019: BUN 14; Creatinine, Ser 0.86; Potassium 4.7; Sodium 138  Recent Lipid Panel No results found for: CHOL, TRIG, HDL, CHOLHDL, VLDL, LDLCALC, LDLDIRECT  Physical Exam:    VS:  BP 134/60   Pulse 66   Ht 5\' 5"  (1.651 m)   Wt 202 lb (91.6 kg)   SpO2 97%   BMI 33.61 kg/m     Wt Readings from Last 3 Encounters:  07/12/19 202 lb (91.6 kg)  06/21/19 207 lb (93.9 kg)  05/08/19 207 lb (93.9 kg)     GEN: Patient is in no  acute distress HEENT: Normal NECK: No JVD; No carotid bruits LYMPHATICS: No lymphadenopathy CARDIAC: Hear sounds regular, 2/6 systolic murmur at the apex. RESPIRATORY:  Clear to auscultation without rales, wheezing or rhonchi  ABDOMEN: Soft, non-tender, non-distended MUSCULOSKELETAL:  No edema; No deformity  SKIN: Warm and dry NEUROLOGIC:  Alert and oriented x 3 PSYCHIATRIC:  Normal affect   Signed, Jenean Lindau, MD  07/12/2019 2:37 PM    [place]

## 2019-07-12 NOTE — Patient Instructions (Signed)
Medication Instructions:  No medication changes. *If you need a refill on your cardiac medications before your next appointment, please call your pharmacy*   Lab Work: Your physician recommends that you have a bmet today.  If you have labs (blood work) drawn today and your tests are completely normal, you will receive your results only by: Marland Kitchen MyChart Message (if you have MyChart) OR . A paper copy in the mail If you have any lab test that is abnormal or we need to change your treatment, we will call you to review the results.   Testing/Procedures: None ordered   Follow-Up: At [facility], you and your health needs are our priority.  As part of our continuing mission to provide you with exceptional heart care, we have created designated Provider Care Teams.  These Care Teams include your primary Cardiologist (physician) and Advanced Practice Providers (APPs -  Physician Assistants and Nurse Practitioners) who all work together to provide you with the care you need, when you need it.  We recommend signing up for the patient portal called "MyChart".  Sign up information is provided on this After Visit Summary.  MyChart is used to connect with patients for Virtual Visits (Telemedicine).  Patients are able to view lab/test results, encounter notes, upcoming appointments, etc.  Non-urgent messages can be sent to your provider as well.   To learn more about what you can do with MyChart, go to [email or web address].    Your next appointment:   6 month(s)  The format for your next appointment:   In Person  Provider:   Jyl Heinz, MD   Other Instructions NA

## 2019-07-13 ENCOUNTER — Telehealth: Payer: Self-pay

## 2019-07-13 LAB — BASIC METABOLIC PANEL
BUN/Creatinine Ratio: 15 (ref 12–28)
BUN: 12 mg/dL (ref 8–27)
CO2: 26 mmol/L (ref 20–29)
Calcium: 9.1 mg/dL (ref 8.7–10.3)
Chloride: 103 mmol/L (ref 96–106)
Creatinine, Ser: 0.78 mg/dL (ref 0.57–1.00)
GFR calc Af Amer: 88 mL/min/{1.73_m2} (ref >59)
GFR calc non Af Amer: 76 mL/min/{1.73_m2} (ref >59)
Glucose: 117 mg/dL — ABNORMAL HIGH (ref 65–99)
Potassium: 4.5 mmol/L (ref 3.5–5.2)
Sodium: 138 mmol/L (ref 134–144)

## 2019-07-13 NOTE — Telephone Encounter (Signed)
-----   Message from Jenean Lindau, MD sent at 07/13/2019 11:32 AM EDT ----- The results of the study is unremarkable. Please inform patient. I will discuss in detail at next appointment. Cc  primary care/referring physician Jenean Lindau, MD 07/13/2019 11:32 AM

## 2019-07-13 NOTE — Telephone Encounter (Signed)
Spoke with patient regarding results and recommendation.  Patient verbalizes understanding and is agreeable to plan of care. Advised patient to call back with any issues or concerns.  

## 2019-09-04 DIAGNOSIS — M25561 Pain in right knee: Secondary | ICD-10-CM

## 2019-09-04 HISTORY — DX: Pain in right knee: M25.561

## 2019-09-13 ENCOUNTER — Other Ambulatory Visit: Payer: Self-pay | Admitting: Cardiology

## 2019-11-01 ENCOUNTER — Other Ambulatory Visit: Payer: Self-pay | Admitting: Family Medicine

## 2019-11-01 DIAGNOSIS — Z1231 Encounter for screening mammogram for malignant neoplasm of breast: Secondary | ICD-10-CM

## 2019-12-12 ENCOUNTER — Other Ambulatory Visit: Payer: Self-pay

## 2019-12-12 ENCOUNTER — Ambulatory Visit
Admission: RE | Admit: 2019-12-12 | Discharge: 2019-12-12 | Disposition: A | Discharge status: Home / Self Care | Payer: Medicare PPO | Source: Ambulatory Visit | Attending: Family Medicine | Admitting: Family Medicine

## 2019-12-12 DIAGNOSIS — Z1231 Encounter for screening mammogram for malignant neoplasm of breast: Secondary | ICD-10-CM

## 2020-01-15 ENCOUNTER — Other Ambulatory Visit: Payer: Self-pay

## 2020-01-15 DIAGNOSIS — I639 Cerebral infarction, unspecified: Secondary | ICD-10-CM | POA: Insufficient documentation

## 2020-01-16 ENCOUNTER — Other Ambulatory Visit: Payer: Self-pay

## 2020-01-16 ENCOUNTER — Encounter: Payer: Self-pay | Admitting: Cardiology

## 2020-01-16 ENCOUNTER — Ambulatory Visit: Payer: Medicare PPO | Admitting: Cardiology

## 2020-01-16 VITALS — BP 138/62 | HR 64 | Ht 65.0 in | Wt 202.0 lb

## 2020-01-16 DIAGNOSIS — E782 Mixed hyperlipidemia: Secondary | ICD-10-CM

## 2020-01-16 DIAGNOSIS — I10 Essential (primary) hypertension: Secondary | ICD-10-CM | POA: Diagnosis not present

## 2020-01-16 DIAGNOSIS — E669 Obesity, unspecified: Secondary | ICD-10-CM | POA: Diagnosis not present

## 2020-01-16 NOTE — Progress Notes (Signed)
Cardiology Office Note:    Date:  01/16/2020   ID:  Rhodia Albright [place], [place] [birth year], MRN [id number]  PCP:  Algis Greenhouse, MD  Cardiologist:  Jenean Lindau, MD   Referring MD: Algis Greenhouse, MD    ASSESSMENT:    1. Essential hypertension   2. Hyperlipemia, mixed   3. Obesity (BMI 30-39.9)    PLAN:    In order of problems listed above:  1. Primary prevention stressed with the patient.  Importance of compliance with diet medication stressed any vocalized understanding. 2. Essential hypertension: Blood pressure is elevated she is brought blood pressure readings from home.  I increase losartan to 100 mg daily.  She will come back in 2 weeks for a Chem-7.  She will also bring blood pressure log from home.  We will review it and act accordingly. 3. Mixed dyslipidemia on statin therapy: I discussed diet at extensive length.  She promises to do better. 4. Obesity: Weight reduction stressed diet and exercise emphasized and she promises to get back on track. 5. Patient will be seen in follow-up appointment in 6 months or earlier if the patient has any concerns    Medication Adjustments/Labs and Tests Ordered: Current medicines are reviewed at length with the patient today.  Concerns regarding medicines are outlined above.  No orders of the defined types were placed in this encounter.  No orders of the defined types were placed in this encounter.    No chief complaint on file.    History of Present Illness:    Alisha Cochran is a 74 y.o. female.  Patient has past medical history of essential hypertension and dyslipidemia.  She denies any problems at this time and takes care of activities of daily living.  No chest pain orthopnea or PND.  She walks on a regular basis.  At the time of my evaluation, the patient is alert awake oriented and in no distress.  Past Medical History:  Diagnosis Date  . Acute pain of right knee 09/04/2019   Formatting of this note might be  different from the original. 2021  . Allergic rhinitis 09/15/2015  . Anemia, iron deficiency 07/21/2015  . Arthropathy 09/15/2015  . Benign hypertension 07/21/2015  . Chronic pain syndrome 05/22/2015  . Degeneration of intervertebral disc of lumbar region 05/22/2015   Overview:  2014: MRI DDD L4/5  Formatting of this note might be different from the original. 2014: MRI DDD L4/5  . Diverticulosis of colon 09/15/2015   2014, 2020: diverticulitis  . Dyspnea on exertion 10/25/2018   2020  . Essential hypertension 05/08/2019  . Family history of premature coronary artery disease 04/12/2018  . Fibrocystic breast disease 09/15/2015  . Herpes zoster without complication 69/62/9528   2020: left T12  . Hyperlipemia, mixed 05/18/2015  . Insomnia 05/21/2015  . Lumbar radiculopathy 09/25/2012   Overview:  2008: onset 2009: MRI DDD with tear and protrusion L5-S1, facet arthropathy L5-S1 2014: MRI L4-5 DDD with progression of L5-S1 changes 2014: Pain eval, limited  Formatting of this note might be different from the original. 2008: onset 2009: MRI DDD with tear and protrusion L5-S1, facet arthropathy L5-S1 2014: MRI L4-5 DDD with progression of L5-S1 changes 2014: Pain eval, limited  . 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 Obesity (BMI 30-39.9) 03/21/2016  . 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 Overweight 05/08/2019  . Peripheral arterial disease ([place]) 09/15/2015   Overview:  2009: right  CFA 50%, asx 2017: insurance nurse left 0.86 right 0.94  . Pharyngeal lesion 10/08/2016   Overview:  2018: right  . Prediabetes 10/20/2018  . Radiculopathy of lumbar region 09/25/2012   Overview:  2008: onset 2009: MRI DDD with tear and protrusion L5-S1, facet arthropathy L5-S1 2014: MRI L4-5 DDD with progression of L5-S1 changes 2014: Pain eval, limited  . Screening for osteoporosis 09/15/2015   2008: -0.7 2016: -1.6 2019: -1.5  Formatting of this note might be different from the  original. 2008: -0.7 2016: -1.6 2019: -1.5  Formatting of this note might be different from the original. 2008: -0.7 2016: -1.6 2019: -1.5 2021: -1.6  . Stenosis of left carotid artery 06/22/2019   Formatting of this note might be different from the original. 2021: doppler 1-39%, right 0%  . Stroke ([place])   . 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 Wellness examination 09/15/2015    Past Surgical History:  Procedure Laterality Date  . BREAST EXCISIONAL BIOPSY Right    x2  . BREAST EXCISIONAL BIOPSY Left    x2  . CHOLECYSTECTOMY    . OVARIAN CYST SURGERY    . ROTATOR CUFF REPAIR Right 2008  . ROTATOR CUFF REPAIR Left 2006  . TONSILLECTOMY    . VAGINAL HYSTERECTOMY  1987    Current Medications: Current Meds  Medication Sig  . amLODipine (NORVASC) 5 MG tablet Take 5 mg by mouth daily.  Marland Kitchen aspirin 81 MG EC tablet Take 81 mg by mouth daily.  . calcium-vitamin D (OSCAL WITH D) 500-200 MG-UNIT TABS tablet Take by mouth daily. Take 2 tablets daily  . doxazosin (CARDURA) 2 MG tablet 2 mg every evening.  . gabapentin (NEURONTIN) 300 MG capsule Take 300 mg by mouth daily.  Marland Kitchen losartan (COZAAR) 100 MG tablet TAKE 1/2 TABLET(50 MG) BY MOUTH DAILY  . metoprolol tartrate (LOPRESSOR) 50 MG tablet 50 mg every 12 (twelve) hours.  . Multiple Vitamin (MULTI-VITAMIN) tablet Take by mouth.  . rosuvastatin (CRESTOR) 40 MG tablet 40 mg daily.  Marland Kitchen zolpidem (AMBIEN) 10 MG tablet Take 5 mg by mouth as needed.     Allergies:   Lisinopril   Social History   Socioeconomic History  . Marital status: Married    Spouse name: Not on file  . Number of children: Not on file  . Years of education: Not on file  . Highest education level: Not on file  Occupational History  . Not on file  Tobacco Use  . Smoking status: Former Smoker    Types: Cigarettes    Start date: 1966    Quit date: 03/01/1978    Years since quitting: 41.9  . Smokeless tobacco: Never Used  Substance and Sexual Activity  . Alcohol use: Yes     Alcohol/week: 14.0 standard drinks    Types: 14 Glasses of wine per week  . Drug use: Never  . Sexual activity: Not on file  Other Topics Concern  . Not on file  Social History Narrative  . Not on file   Social Determinants of Health   Financial Resource Strain:   . Difficulty of Paying Living Expenses: Not on file  Food Insecurity:   . Worried About Charity fundraiser in the Last Year: Not on file  . Ran Out of Food in the Last Year: Not on file  Transportation Needs:   . Lack of Transportation (Medical): Not on file  . Lack of Transportation (Non-Medical): Not on file  Physical Activity:   .  Days of Exercise per Week: Not on file  . Minutes of Exercise per Session: Not on file  Stress:   . Feeling of Stress : Not on file  Social Connections:   . Frequency of Communication with Friends and Family: Not on file  . Frequency of Social Gatherings with Friends and Family: Not on file  . Attends Religious Services: Not on file  . Active Member of Clubs or Organizations: Not on file  . Attends Archivist Meetings: Not on file  . Marital Status: Not on file     Family History: The patient's family history includes Breast cancer in her maternal grandmother, mother, and paternal grandmother; Heart attack in her paternal grandfather and paternal grandmother; Heart disease in her father.  ROS:   Please see the history of present illness.    All other systems reviewed and are negative.  EKGs/Labs/Other Studies Reviewed:    The following studies were reviewed today: I discussed my findings with the patient in extensive length   Recent Labs: 07/12/2019: BUN 12; Creatinine, Ser 0.78; Potassium 4.5; Sodium 138  Recent Lipid Panel No results found for: CHOL, TRIG, HDL, CHOLHDL, VLDL, LDLCALC, LDLDIRECT  Physical Exam:    VS:  BP 138/62   Pulse 64   Ht 5\' 5"  (1.651 m)   Wt 202 lb (91.6 kg)   SpO2 97%   BMI 33.61 kg/m     Wt Readings from Last 3 Encounters:   01/16/20 202 lb (91.6 kg)  07/12/19 202 lb (91.6 kg)  06/21/19 207 lb (93.9 kg)     GEN: Patient is in no acute distress HEENT: Normal NECK: No JVD; No carotid bruits LYMPHATICS: No lymphadenopathy CARDIAC: Hear sounds regular, 2/6 systolic murmur at the apex. RESPIRATORY:  Clear to auscultation without rales, wheezing or rhonchi  ABDOMEN: Soft, non-tender, non-distended MUSCULOSKELETAL:  No edema; No deformity  SKIN: Warm and dry NEUROLOGIC:  Alert and oriented x 3 PSYCHIATRIC:  Normal affect   Signed, Jenean Lindau, MD  01/16/2020 2:28 PM    [place]

## 2020-01-16 NOTE — Patient Instructions (Signed)
Medication Instructions:  No medication changes. *If you need a refill on your cardiac medications before your next appointment, please call your pharmacy*   Lab Work: Your physician recommends that you return for lab work in: 2 weeks (01/30/20). You can come Monday through Friday 8:30 am to 12:00 pm and 1:15 to 4:30. You do not need to make an appointment as the order has already been placed.   If you have labs (blood work) drawn today and your tests are completely normal, you will receive your results only by: Marland Kitchen MyChart Message (if you have MyChart) OR . A paper copy in the mail If you have any lab test that is abnormal or we need to change your treatment, we will call you to review the results.   Testing/Procedures: None ordered   Follow-Up: At [facility], you and your health needs are our priority.  As part of our continuing mission to provide you with exceptional heart care, we have created designated Provider Care Teams.  These Care Teams include your primary Cardiologist (physician) and Advanced Practice Providers (APPs -  Physician Assistants and Nurse Practitioners) who all work together to provide you with the care you need, when you need it.  We recommend signing up for the patient portal called "MyChart".  Sign up information is provided on this After Visit Summary.  MyChart is used to connect with patients for Virtual Visits (Telemedicine).  Patients are able to view lab/test results, encounter notes, upcoming appointments, etc.  Non-urgent messages can be sent to your provider as well.   To learn more about what you can do with MyChart, go to [email or web address].    Your next appointment:   6 month(s)  The format for your next appointment:   In Person  Provider:   Jyl Heinz, MD   Other Instructions NA

## 2020-01-30 LAB — BASIC METABOLIC PANEL
BUN/Creatinine Ratio: 15 (ref 12–28)
BUN: 12 mg/dL (ref 8–27)
CO2: 27 mmol/L (ref 20–29)
Calcium: 9.1 mg/dL (ref 8.7–10.3)
Chloride: 101 mmol/L (ref 96–106)
Creatinine, Ser: 0.78 mg/dL (ref 0.57–1.00)
GFR calc Af Amer: 88 mL/min/{1.73_m2} (ref >59)
GFR calc non Af Amer: 76 mL/min/{1.73_m2} (ref >59)
Glucose: 123 mg/dL — ABNORMAL HIGH (ref 65–99)
Potassium: 4.7 mmol/L (ref 3.5–5.2)
Sodium: 138 mmol/L (ref 134–144)

## 2020-02-25 ENCOUNTER — Telehealth: Payer: Self-pay | Admitting: Cardiology

## 2020-02-25 MED ORDER — LOSARTAN POTASSIUM 100 MG PO TABS
100.0000 mg | ORAL_TABLET | Freq: Every day | ORAL | 2 refills | Status: DC
Start: 2020-02-25 — End: 2020-10-08

## 2020-02-25 NOTE — Telephone Encounter (Signed)
Pt c/o medication issue:  1. Name of Medication: losartan (COZAAR) 100 MG tablet  2. How are you currently taking this medication (dosage and times per day)? Pt is now taking full 100 mg tablet  3. Are you having a reaction (difficulty breathing--STAT)? no  4. What is your medication issue? Pt is out of medication. Her dosage changed so now she is out of medicine    *STAT* If patient is at the pharmacy, call can be transferred to refill team.   1. Which medications need to be refilled? (please list name of each medication and dose if known) losartan (COZAAR) 100 MG tablet  2. Which pharmacy/location (including street and city if local pharmacy) is medication to be sent to?  [facility] [phone] - [place], [place] - [address] AT [address]  3. Do they need a 30 day or 90 day supply? 90 with refills

## 2020-05-09 ENCOUNTER — Telehealth: Payer: Self-pay | Admitting: Cardiology

## 2020-05-09 DIAGNOSIS — I10 Essential (primary) hypertension: Secondary | ICD-10-CM

## 2020-05-09 MED ORDER — FUROSEMIDE 40 MG PO TABS: 40.0000 mg | ORAL_TABLET | Freq: Every day | ORAL | 3 refills | Status: DC

## 2020-05-09 NOTE — Telephone Encounter (Signed)
Spoke with pt who reports she is having pitting edema in her feet and ankles x 10-14 days.  Pt denies current CP or SOB.She reports a weight gain of 6-8 pounds over the last couple of weeks as well. She does not check her BP daily but states recent BP's have been WNL. She states she is taking medications as prescribed but is not currently on a fluid pill.  Pt does not add salt to her food but does drink diet soda.   Encouraged pt to monitor Na+ as many preserved foods have hidden salt.  Decrease amount of diet soda and substitute with water (Pt states she is not fluid restricted)  Elevate feet and legs above heart level as much as possible. Will forward information to Dr Geraldo Pitter for further review and recommendation.  Reviewed ED precautions.  Pt verbalizes understanding and agrees with current plan.

## 2020-05-09 NOTE — Telephone Encounter (Signed)
Recommendations reviewed with pt as per Dr. Revankar's note.  Pt verbalized understanding and had no additional questions.   

## 2020-05-09 NOTE — Telephone Encounter (Signed)
Stop amlodipine.  Furosemide 40 mg daily.  Encourage potassium supplementation and diet.  Chem-7 and blood pressure log on Monday to bring to the office.

## 2020-05-09 NOTE — Telephone Encounter (Signed)
Pt c/o swelling: STAT is pt has developed SOB within 24 hours  1) How much weight have you gained and in what time span? Probably 6-8 lbs in 2 weeks   2) If swelling, where is the swelling located? Ankles and Feet   3) Are you currently taking a fluid pill? No   4) Are you currently SOB? No   5) Do you have a log of your daily weights (if so, list)? No   6) Have you gained 3 pounds in a day or 5 pounds in a week? Doesn't think so   7) Have you traveled recently? No    Alisha Cochran is calling stating she has been having swelling in her feet and ankles for the past 2 weeks. Her right leg swelled so much it became tight and hard causing splits on the top of her foot. This also occurred on her right foot, but not as severely. Please advise.

## 2020-05-13 LAB — BASIC METABOLIC PANEL
BUN/Creatinine Ratio: 16 (ref 12–28)
BUN: 14 mg/dL (ref 8–27)
CO2: 26 mmol/L (ref 20–29)
Calcium: 8.5 mg/dL — ABNORMAL LOW (ref 8.7–10.3)
Chloride: 104 mmol/L (ref 96–106)
Creatinine, Ser: 0.88 mg/dL (ref 0.57–1.00)
Glucose: 138 mg/dL — ABNORMAL HIGH (ref 65–99)
Potassium: 4 mmol/L (ref 3.5–5.2)
Sodium: 141 mmol/L (ref 134–144)
eGFR: 70 mL/min/{1.73_m2} (ref >59)

## 2020-05-14 ENCOUNTER — Telehealth: Payer: Self-pay

## 2020-05-14 NOTE — Telephone Encounter (Signed)
05-09-20 3:45 pm 131/69  P 71 After stopping Amlodipine and starting Furosemide 40 mg  05-09-20 Took meds at 6:30 pm 9:30 pm B/P 126/72  05-10-20 Took meds at 8:30 am 9:50 am B/P 144/77 P 66 10:30 am B/P 124/69 P72 11:00 pm B/P 174/85 P97- active providing dinner for several guests  05-11-20 Took meds 8:30 am 10:30 am B/P 138/55 P 83 2:30 pm B/P 135/70 P70  05-12-20 Took meds 8:15 am 10:00 am B/P 132/68 P 63

## 2020-05-15 NOTE — Telephone Encounter (Signed)
Called patient and informed her of Dr. Julien Nordmann recommendations per last encounter.

## 2020-05-15 NOTE — Telephone Encounter (Signed)
Blood pressure is fine and continue current medications.

## 2020-07-04 DIAGNOSIS — T148XXA Other injury of unspecified body region, initial encounter: Secondary | ICD-10-CM

## 2020-07-04 HISTORY — DX: Other injury of unspecified body region, initial encounter: T14.8XXA

## 2020-07-14 ENCOUNTER — Other Ambulatory Visit: Payer: Self-pay

## 2020-07-14 ENCOUNTER — Encounter: Payer: Self-pay | Admitting: Cardiology

## 2020-07-14 ENCOUNTER — Ambulatory Visit: Payer: Medicare PPO | Admitting: Cardiology

## 2020-07-14 VITALS — BP 132/60 | HR 65 | Ht 65.0 in | Wt 206.4 lb

## 2020-07-14 DIAGNOSIS — E782 Mixed hyperlipidemia: Secondary | ICD-10-CM

## 2020-07-14 DIAGNOSIS — I10 Essential (primary) hypertension: Secondary | ICD-10-CM

## 2020-07-14 DIAGNOSIS — I6522 Occlusion and stenosis of left carotid artery: Secondary | ICD-10-CM | POA: Diagnosis not present

## 2020-07-14 DIAGNOSIS — I739 Peripheral vascular disease, unspecified: Secondary | ICD-10-CM

## 2020-07-14 HISTORY — DX: Peripheral vascular disease, unspecified: I73.9

## 2020-07-14 NOTE — Progress Notes (Signed)
Cardiology Office Note:    Date:  07/14/2020   ID:  Alisha Cochran, [place] Nov 16, 1945, MRN [id number]  PCP:  Alisha Greenhouse, MD  Cardiologist:  Alisha Lindau, MD   Referring MD: Alisha Greenhouse, MD    ASSESSMENT:    1. Hyperlipemia, mixed   2. Peripheral arterial disease ([place])   3. Essential hypertension   4. Stenosis of left carotid artery   5. Benign hypertension   6. Intermittent claudication (H[facility])    PLAN:    In order of problems listed above:  1. Primary prevention stressed with patient.  Importance of compliance with diet medication stressed and she vocalized understanding. 2. Essential hypertension: Blood pressure stable.  Diet was emphasized lifestyle modification was urged.  She is on diuretic and will have a Chem-7 today. 3. Mixed dyslipidemia: Lipids followed by primary care physician.  Diet emphasized.  Weight reduction stressed and she promises to do better. 4. Intermittent claudication: We will check ABI.  Peripheral pulses are barely felt.  I discussed this with her at length and she understands. 5. Patient will be seen in follow-up appointment in 6 months or earlier if the patient has any concerns    Medication Adjustments/Labs and Tests Ordered: Current medicines are reviewed at length with the patient today.  Concerns regarding medicines are outlined above.  Orders Placed This Encounter  Procedures  . Basic metabolic panel  . EKG 12-Lead  . VAS [place] ABI WITH/WO TBI   No orders of the defined types were placed in this encounter.    No chief complaint on file.    History of Present Illness:    Alisha Cochran is a 75 y.o. female.  Patient has past medical history of essential hypertension dyslipidemia and history of stroke.  She mentions to me that her aspirin was discontinued by primary care physician because of significant ecchymosis.  She denies any chest pain orthopnea or PND.  At the time of my evaluation, the patient is alert awake  oriented and in no distress.  She gives history of what to suggest to be intermittent claudication.  Past Medical History:  Diagnosis Date  . Acute pain of right knee 09/04/2019   Formatting of this note might be different from the original. 2021  . Allergic rhinitis 09/15/2015  . Anemia, iron deficiency 07/21/2015  . Arthropathy 09/15/2015  . Benign hypertension 07/21/2015  . Chronic pain syndrome 05/22/2015  . Degeneration of intervertebral disc of lumbar region 05/22/2015   Overview:  2014: MRI DDD L4/5  Formatting of this note might be different from the original. 2014: MRI DDD L4/5  . Diverticulosis of colon 09/15/2015   2014, 2020: diverticulitis  . Dyspnea on exertion 10/25/2018   2020  . Essential hypertension 05/08/2019  . Family history of premature coronary artery disease 04/12/2018  . Fibrocystic breast disease 09/15/2015  . Herpes zoster without complication 70/62/3762   2020: left T12  . Hyperlipemia, mixed 05/18/2015  . Insomnia 05/21/2015  . Lumbar radiculopathy 09/25/2012   Overview:  2008: onset 2009: MRI DDD with tear and protrusion L5-S1, facet arthropathy L5-S1 2014: MRI L4-5 DDD with progression of L5-S1 changes 2014: Pain eval, limited  Formatting of this note might be different from the original. 2008: onset 2009: MRI DDD with tear and protrusion L5-S1, facet arthropathy L5-S1 2014: MRI L4-5 DDD with progression of L5-S1 changes 2014: Pain eval, limited  . 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 Obesity (BMI 30-39.9) 03/21/2016  . Other intervertebral disc degeneration,  lumbar region 05/22/2015   Overview:  2014: MRI DDD L4/5  . Overweight 05/08/2019  . Peripheral arterial disease ([place]) 09/15/2015   Overview:  2009: right CFA 50%, asx 2017: insurance nurse left 0.86 right 0.94  . Pharyngeal lesion 10/08/2016   Overview:  2018: right  . Prediabetes 10/20/2018  . Radiculopathy of lumbar region 09/25/2012   Overview:  2008: onset 2009: MRI DDD with tear and protrusion L5-S1, facet arthropathy  L5-S1 2014: MRI L4-5 DDD with progression of L5-S1 changes 2014: Pain eval, limited  . Screening for osteoporosis 09/15/2015   2008: -0.7 2016: -1.6 2019: -1.5  Formatting of this note might be different from the original. 2008: -0.7 2016: -1.6 2019: -1.5  Formatting of this note might be different from the original. 2008: -0.7 2016: -1.6 2019: -1.5 2021: -1.6  . Stenosis of left carotid artery 06/22/2019   Formatting of this note might be different from the original. 2021: doppler 1-39%, right 0%  . Stroke ([place])   . 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 Wellness examination 09/15/2015    Past Surgical History:  Procedure Laterality Date  . BREAST EXCISIONAL BIOPSY Right    x2  . BREAST EXCISIONAL BIOPSY Left    x2  . CHOLECYSTECTOMY    . OVARIAN CYST SURGERY    . ROTATOR CUFF REPAIR Right 2008  . ROTATOR CUFF REPAIR Left 2006  . TONSILLECTOMY    . VAGINAL HYSTERECTOMY  1987    Current Medications: Current Meds  Medication Sig  . calcium-vitamin D (OSCAL WITH D) 500-200 MG-UNIT TABS tablet Take 1 tablet by mouth daily.  Marland Kitchen doxazosin (CARDURA) 2 MG tablet Take 2 mg by mouth every evening.  . furosemide (LASIX) 40 MG tablet Take 1 tablet (40 mg total) by mouth daily.  . lansoprazole (PREVACID) 15 MG capsule Take 15 mg by mouth daily at 12 noon.  Marland Kitchen losartan (COZAAR) 100 MG tablet Take 1 tablet (100 mg total) by mouth daily.  . metoprolol tartrate (LOPRESSOR) 50 MG tablet Take 50 mg by mouth every 12 (twelve) hours.  . Multiple Vitamin (MULTI-VITAMIN) tablet Take 1 tablet by mouth daily.  . rosuvastatin (CRESTOR) 40 MG tablet Take 40 mg by mouth daily.     Allergies:   Lisinopril   Social History   Socioeconomic History  . Marital status: Married    Spouse name: Not on file  . Number of children: Not on file  . Years of education: Not on file  . Highest education level: Not on file  Occupational History  . Not on file  Tobacco Use  . Smoking status: Former Smoker    Types:  Cigarettes    Start date: 1966    Quit date: 03/01/1978    Years since quitting: 42.4  . Smokeless tobacco: Never Used  Substance and Sexual Activity  . Alcohol use: Yes    Alcohol/week: 14.0 standard drinks    Types: 14 Glasses of wine per week  . Drug use: Never  . Sexual activity: Not on file  Other Topics Concern  . Not on file  Social History Narrative  . Not on file   Social Determinants of Health   Financial Resource Strain: Not on file  Food Insecurity: Not on file  Transportation Needs: Not on file  Physical Activity: Not on file  Stress: Not on file  Social Connections: Not on file     Family History: The patient's family history includes Breast cancer in her maternal grandmother, mother, and  paternal grandmother; Heart attack in her paternal grandfather and paternal grandmother; Heart disease in her father.  ROS:   Please see the history of present illness.    All other systems reviewed and are negative.  EKGs/Labs/Other Studies Reviewed:    The following studies were reviewed today: I discussed my findings with the patient at length.   Recent Labs: 05/12/2020: BUN 14; Creatinine, Ser 0.88; Potassium 4.0; Sodium 141  Recent Lipid Panel No results found for: CHOL, TRIG, HDL, CHOLHDL, VLDL, LDLCALC, LDLDIRECT  Physical Exam:    VS:  BP 132/60   Pulse 65   Ht 5\' 5"  (1.651 m)   Wt 206 lb 6.4 oz (93.6 kg)   SpO2 98%   BMI 34.35 kg/m     Wt Readings from Last 3 Encounters:  07/14/20 206 lb 6.4 oz (93.6 kg)  01/16/20 202 lb (91.6 kg)  07/12/19 202 lb (91.6 kg)     GEN: Patient is in no acute distress HEENT: Normal NECK: No JVD; No carotid bruits LYMPHATICS: No lymphadenopathy CARDIAC: Hear sounds regular, 2/6 systolic murmur at the apex.  Peripheral pulses are barely felt RESPIRATORY:  Clear to auscultation without rales, wheezing or rhonchi  ABDOMEN: Soft, non-tender, non-distended MUSCULOSKELETAL:  No edema; No deformity  SKIN: Warm and  dry NEUROLOGIC:  Alert and oriented x 3 PSYCHIATRIC:  Normal affect   Signed, Alisha Lindau, MD  07/14/2020 1:45 PM    [facility]

## 2020-07-14 NOTE — Patient Instructions (Signed)
Medication Instructions:  No medication changes. *If you need a refill on your cardiac medications before your next appointment, please call your pharmacy*   Lab Work: Your physician recommends that you have a BMET today in the office.  If you have labs (blood work) drawn today and your tests are completely normal, you will receive your results only by: Marland Kitchen MyChart Message (if you have MyChart) OR . A paper copy in the mail If you have any lab test that is abnormal or we need to change your treatment, we will call you to review the results.   Testing/Procedures: Your physician has requested that you have an ankle brachial index (ABI). During this test an ultrasound and blood pressure cuff are used to evaluate the arteries that supply the arms and legs with blood. Allow thirty minutes for this exam. There are no restrictions or special instructions.    Follow-Up: At [facility], you and your health needs are our priority.  As part of our continuing mission to provide you with exceptional heart care, we have created designated Provider Care Teams.  These Care Teams include your primary Cardiologist (physician) and Advanced Practice Providers (APPs -  Physician Assistants and Nurse Practitioners) who all work together to provide you with the care you need, when you need it.  We recommend signing up for the patient portal called "MyChart".  Sign up information is provided on this After Visit Summary.  MyChart is used to connect with patients for Virtual Visits (Telemedicine).  Patients are able to view lab/test results, encounter notes, upcoming appointments, etc.  Non-urgent messages can be sent to your provider as well.   To learn more about what you can do with MyChart, go to [email or web address].    Your next appointment:   6 month(s)  The format for your next appointment:   In Person  Provider:   Jyl Heinz, MD   Other Instructions  Ankle-Brachial Index Test Why am I  having this test? The ankle-brachial index (ABI) test is used to diagnose peripheral vascular disease (PVD). PVD is also known as peripheral arterial disease (PAD). PVD is the blocking or hardening of the arteries anywhere within the circulatory system beyond the heart. It is a form of cardiovascular disease. PVD is caused by:  Cholesterol deposits in your blood vessels (atherosclerosis). This is the most common cause of this condition.  Inflammation in the blood vessels.  Blood clots in the vessels. Cholesterol deposits cause arteries to narrow. Decreased delivery of oxygen to your tissues causes muscle pain, cramping, and fatigue that occurs during exercise and is relieved by rest. This is called intermittent claudication. PVD means that there may also be buildup of cholesterol:  In your heart. This raises the risk of heart attacks.  In your brain. This raises the risk of strokes. What is being tested? The ankle-brachial index test measures the blood flow in your arms and legs. The blood flow will show if blood vessels in your legs have been narrowed by cholesterol deposits. How do I prepare for this test?  Wear loose, comfortable clothing with shoes that are easy to remove.  Do not use any products that contain nicotine or tobacco for at least 30 minutes before the test. These products include cigarettes, chewing tobacco, and vaping devices, such as e-cigarettes.  Avoid caffeine, alcohol, and heavy activity an hour before the test. What happens during the test?  You will lie down in a resting position with your legs and arms at heart  level.  Your health care provider will use a blood pressure machine and a small ultrasound device (Doppler) to measure the systolic pressures on your upper arms and ankles. Systolic pressure is the pressure inside your arteries when your heart pumps.  Systolic pressures will be taken several times, and at several points, on both the ankle and the arm.  Your health care provider may also measure your toe.  Your health care provider will divide the highest systolic pressure of the ankle by the highest systolic pressure of the arm. The result is the ankle-brachial pressure ratio, or ABI.  Sometimes this test will be repeated after you have exercised on a treadmill for 5 minutes. You may have leg pain during the exercise portion of the test if you suffer from PVD. If the index number drops after exercise, this may show that PVD is present.   How are the results reported? Your test results will be reported as a value that shows the ratio of your ankle pressure to your arm pressure (ABI ratio). Your health care provider will compare your results to normal ranges that were established after testing a large group of people (reference ranges). These ranges may vary among labs and hospitals. For this test, an abnormal reading is typically considered, which is an ABI ratio of 0.9 or less. What do the results mean? An ABI ratio that is below the reference range is considered abnormal and may indicate PVD in the legs. Talk with your health care provider about what your results mean. Questions to ask your health care provider Ask your health care provider, or the department that is doing the test:  When will my results be ready?  How will I get my results?  What are my treatment options?  What other tests do I need?  What are my next steps? Summary  The ankle-brachial index (ABI) test is used to diagnose peripheral vascular disease (PVD). PVD is also known as peripheral arterial disease (PAD).  The ABI test measures the blood flow in your arms and legs.  The highest systolic pressure of the ankle is divided by the highest systolic pressure of the arm. The result is the ABI ratio.  An ABI ratio of 0.9 or less is considered abnormal and may indicate PVD in the legs. This information is not intended to replace advice given to you by your health care  provider. Make sure you discuss any questions you have with your health care provider. Document Revised: 08/20/2019 Document Reviewed: 08/20/2019 Elsevier Patient Education  [place].

## 2020-07-15 LAB — BASIC METABOLIC PANEL
BUN/Creatinine Ratio: 16 (ref 12–28)
BUN: 14 mg/dL (ref 8–27)
CO2: 26 mmol/L (ref 20–29)
Calcium: 8.9 mg/dL (ref 8.7–10.3)
Chloride: 101 mmol/L (ref 96–106)
Creatinine, Ser: 0.85 mg/dL (ref 0.57–1.00)
Glucose: 104 mg/dL — ABNORMAL HIGH (ref 65–99)
Potassium: 4.2 mmol/L (ref 3.5–5.2)
Sodium: 139 mmol/L (ref 134–144)
eGFR: 72 mL/min/{1.73_m2} (ref >59)

## 2020-07-16 ENCOUNTER — Other Ambulatory Visit: Payer: Self-pay

## 2020-07-16 ENCOUNTER — Ambulatory Visit (INDEPENDENT_AMBULATORY_CARE_PROVIDER_SITE_OTHER): Payer: Medicare PPO

## 2020-07-16 DIAGNOSIS — I739 Peripheral vascular disease, unspecified: Secondary | ICD-10-CM | POA: Diagnosis not present

## 2020-07-17 ENCOUNTER — Telehealth: Payer: Self-pay | Admitting: Hematology and Oncology

## 2020-07-17 NOTE — Telephone Encounter (Signed)
Scheduled per referral. Called and spoke with pt, confirmed 5/25 appt  

## 2020-07-23 ENCOUNTER — Telehealth: Payer: Self-pay | Admitting: Hematology and Oncology

## 2020-07-23 ENCOUNTER — Inpatient Hospital Stay: Payer: Medicare PPO | Attending: Hematology and Oncology | Admitting: Hematology and Oncology

## 2020-07-23 ENCOUNTER — Encounter: Payer: Self-pay | Admitting: Hematology and Oncology

## 2020-07-23 ENCOUNTER — Other Ambulatory Visit: Payer: Self-pay | Admitting: Pharmacist

## 2020-07-23 ENCOUNTER — Other Ambulatory Visit: Payer: Self-pay

## 2020-07-23 VITALS — BP 166/80 | HR 69 | Temp 98.2°F | Resp 18 | Ht 65.0 in | Wt 199.8 lb

## 2020-07-23 DIAGNOSIS — D508 Other iron deficiency anemias: Secondary | ICD-10-CM | POA: Diagnosis not present

## 2020-07-23 DIAGNOSIS — Z8673 Personal history of transient ischemic attack (TIA), and cerebral infarction without residual deficits: Secondary | ICD-10-CM

## 2020-07-23 DIAGNOSIS — Z803 Family history of malignant neoplasm of breast: Secondary | ICD-10-CM

## 2020-07-23 DIAGNOSIS — Z87891 Personal history of nicotine dependence: Secondary | ICD-10-CM | POA: Diagnosis not present

## 2020-07-23 DIAGNOSIS — T148XXA Other injury of unspecified body region, initial encounter: Secondary | ICD-10-CM

## 2020-07-23 DIAGNOSIS — R233 Spontaneous ecchymoses: Secondary | ICD-10-CM

## 2020-07-23 HISTORY — DX: Personal history of transient ischemic attack (TIA), and cerebral infarction without residual deficits: Z86.73

## 2020-07-23 NOTE — Assessment & Plan Note (Signed)
Her skin bruising is likely caused by aspirin therapy and history of NSAID use I recommend the patient to resume aspirin therapy and to avoid NSAID use

## 2020-07-23 NOTE — Assessment & Plan Note (Signed)
The most likely cause of her iron deficiency is likely due to reduced reserve due to chronic history of heavy menstruation when she was premenopausal along with prior history of blood donation GI blood loss cannot be excluded.  I agree with recommendation to proceed with GI work-up Her diet is sporadic and she does not eat much meat or iron rich food She has not been taking her oral iron supplement correctly in the past We discussed the risk and benefits of continuing oral iron supplement versus IV iron infusion The most likely cause of her anemia is due to chronic blood loss/malabsorption syndrome. We discussed some of the risks, benefits, and alternatives of intravenous iron infusions. The patient is symptomatic from anemia and the iron level is critically low. She tolerated oral iron supplement poorly and desires to achieved higher levels of iron faster for adequate hematopoesis. Some of the side-effects to be expected including risks of infusion reactions, phlebitis, headaches, nausea and fatigue.  The patient is willing to proceed. Patient education material was dispensed.  Goal is to keep ferritin level greater than 50 and resolution of anemia Ultimately she would like to proceed with IV iron I will schedule 2 doses of IV iron sucrose 300 mg, followed by repeat labs about 6 weeks later and will see her back

## 2020-07-23 NOTE — Progress Notes (Signed)
[place] NOTE  Patient Care Team: Algis Greenhouse, MD as PCP - General (Family Medicine)  CHIEF COMPLAINTS/PURPOSE OF CONSULTATION:  Chronic iron deficiency anemia  HISTORY OF PRESENTING ILLNESS:  Alisha Cochran 75 y.o. female is here because of chronic iron deficiency anemia  She was found to have abnormal CBC from routine blood draw I have the opportunity to review her test results from care everywhere The patient stated that she has been anemic all her life On review of her documented test results, in January 23, 2013, her white blood cell count is 8.4, hemoglobin 11.7 and platelet count of 257 Most recently, on Jul 04, 2020, her white blood cell count 7.3, hemoglobin 10.5 and platelet count of 233,000 Her blood count/hemoglobin fluctuated between 10.5-11.9 but never over 12 Her serum creatinine was normal at 0.8 last year I have reviewed her iron studies Iron is at 53, TIBC 37, iron saturation low at 14% with ferritin of 30 She denies recent chest pain on exertion, shortness of breath on minimal exertion, pre-syncopal episodes, or palpitations. However, she does complain of fatigue She had not noticed any recent bleeding such as epistaxis, hematuria or hematochezia The patient has been on chronic aspirin therapy due to history of TIA x2 In the past, she took 5 baby aspirins per day.  Then, approximately 1 to 2 years ago, the dosage of aspirin was reduced to 81 mg/day She used to take NSAID with Advil periodically for joint pain. With her most recent evaluation by her primary care doctor, she was told to stop aspirin completely.  She was referred to see me as well as gastroenterologist for further evaluation Her last colonoscopy was more than 10 years ago.  She was evaluated extensively for chronic anemia with EGD and colonoscopy but all her test results came back unremarkable She has significant history of menorrhagia when she was younger.  She has 2  pregnancies and her second son's delivery was complicated with postpartum hemorrhage She never required blood transfusion support She has donated blood several times but her last blood donation was over 10 years ago She does not eat much meat.  In general, she only eats once a day.  She has been taking 1 oral iron supplement for the past 6 to 7 months with breakfast She had no prior history or diagnosis of cancer. Her age appropriate screening programs are up-to-date. She denies any pica and eats a variety of diet.   MEDICAL HISTORY:  Past Medical History:  Diagnosis Date  . Acute pain of right knee 09/04/2019   Formatting of this note might be different from the original. 2021  . Allergic rhinitis 09/15/2015  . Anemia, iron deficiency 07/21/2015  . Arthropathy 09/15/2015  . Benign hypertension 07/21/2015  . Chronic pain syndrome 05/22/2015  . Degeneration of intervertebral disc of lumbar region 05/22/2015   Overview:  2014: MRI DDD L4/5  Formatting of this note might be different from the original. 2014: MRI DDD L4/5  . Diverticulosis of colon 09/15/2015   2014, 2020: diverticulitis  . Dyspnea on exertion 10/25/2018   2020  . Essential hypertension 05/08/2019  . Family history of premature coronary artery disease 04/12/2018  . Fibrocystic breast disease 09/15/2015  . Herpes zoster without complication 44/03/270   2020: left T12  . Hyperlipemia, mixed 05/18/2015  . Insomnia 05/21/2015  . Lumbar radiculopathy 09/25/2012   Overview:  2008: onset 2009: MRI DDD with tear and protrusion L5-S1, facet arthropathy L5-S1 2014:  MRI L4-5 DDD with progression of L5-S1 changes 2014: Pain eval, limited  Formatting of this note might be different from the original. 2008: onset 2009: MRI DDD with tear and protrusion L5-S1, facet arthropathy L5-S1 2014: MRI L4-5 DDD with progression of L5-S1 changes 2014: Pain eval, limited  . 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 Obesity (BMI 30-39.9) 03/21/2016  . Other intervertebral disc  degeneration, lumbar region 05/22/2015   Overview:  2014: MRI DDD L4/5  . Overweight 05/08/2019  . Peripheral arterial disease ([place]) 09/15/2015   Overview:  2009: right CFA 50%, asx 2017: insurance nurse left 0.86 right 0.94  . Pharyngeal lesion 10/08/2016   Overview:  2018: right  . Prediabetes 10/20/2018  . Radiculopathy of lumbar region 09/25/2012   Overview:  2008: onset 2009: MRI DDD with tear and protrusion L5-S1, facet arthropathy L5-S1 2014: MRI L4-5 DDD with progression of L5-S1 changes 2014: Pain eval, limited  . Screening for osteoporosis 09/15/2015   2008: -0.7 2016: -1.6 2019: -1.5  Formatting of this note might be different from the original. 2008: -0.7 2016: -1.6 2019: -1.5  Formatting of this note might be different from the original. 2008: -0.7 2016: -1.6 2019: -1.5 2021: -1.6  . Stenosis of left carotid artery 06/22/2019   Formatting of this note might be different from the original. 2021: doppler 1-39%, right 0%  . Stroke ([place])   . 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 Wellness examination 09/15/2015    SURGICAL HISTORY: Past Surgical History:  Procedure Laterality Date  . BREAST EXCISIONAL BIOPSY Right    x2  . BREAST EXCISIONAL BIOPSY Left    x2  . CHOLECYSTECTOMY    . OVARIAN CYST SURGERY    . ROTATOR CUFF REPAIR Right 2008  . ROTATOR CUFF REPAIR Left 2006  . TONSILLECTOMY    . VAGINAL HYSTERECTOMY  1987    SOCIAL HISTORY: Social History   Socioeconomic History  . Marital status: Married    Spouse name: Not on file  . Number of children: 2  . Years of education: Not on file  . Highest education level: Not on file  Occupational History  . Occupation: retired Consulting civil engineer  Tobacco Use  . Smoking status: Former Smoker    Types: Cigarettes    Start date: 1966    Quit date: 03/01/1978    Years since quitting: 42.4  . Smokeless tobacco: Never Used  Substance and Sexual Activity  . Alcohol use: Yes    Alcohol/week: 14.0 standard drinks    Types: 14  Glasses of wine per week    Comment: with diinner daily for 50 years  . Drug use: Never  . Sexual activity: Not on file  Other Topics Concern  . Not on file  Social History Narrative  . Not on file   Social Determinants of Health   Financial Resource Strain: Not on file  Food Insecurity: Not on file  Transportation Needs: Not on file  Physical Activity: Not on file  Stress: Not on file  Social Connections: Not on file  Intimate Partner Violence: Not on file    FAMILY HISTORY: Family History  Problem Relation Age of Onset  . Breast cancer Mother   . Breast cancer Maternal Grandmother   . Breast cancer Paternal Grandmother   . Heart attack Paternal Grandmother   . Heart disease Father   . Heart attack Paternal Grandfather     ALLERGIES:  is allergic to lisinopril.  MEDICATIONS:  Current Outpatient Medications  Medication Sig Dispense  Refill  . aspirin 81 MG chewable tablet Chew 81 mg by mouth daily.    . calcium-vitamin D (OSCAL WITH D) 500-200 MG-UNIT TABS tablet Take 1 tablet by mouth daily.    Marland Kitchen doxazosin (CARDURA) 2 MG tablet Take 2 mg by mouth every evening.    . furosemide (LASIX) 40 MG tablet Take 1 tablet (40 mg total) by mouth daily. 30 tablet 3  . lansoprazole (PREVACID) 15 MG capsule Take 15 mg by mouth daily.    Marland Kitchen losartan (COZAAR) 100 MG tablet Take 1 tablet (100 mg total) by mouth daily. 90 tablet 2  . metoprolol tartrate (LOPRESSOR) 50 MG tablet Take 50 mg by mouth every 12 (twelve) hours.    . pregabalin (LYRICA) 75 MG capsule Take 75 mg by mouth at bedtime.    . rosuvastatin (CRESTOR) 40 MG tablet Take 40 mg by mouth daily.     No current facility-administered medications for this visit.    REVIEW OF SYSTEMS:   Constitutional: Denies fevers, chills or abnormal night sweats Eyes: Denies blurriness of vision, double vision or watery eyes Ears, nose, mouth, throat, and face: Denies mucositis or sore throat Respiratory: Denies cough, dyspnea or  wheezes Cardiovascular: Denies palpitation, chest discomfort or lower extremity swelling Gastrointestinal:  Denies nausea, heartburn or change in bowel habits.  Her typical bowel habits is once every 2 days Skin: Denies abnormal skin rashes Lymphatics: Denies new lymphadenopathy  Neurological:Denies numbness, tingling or new weaknesses Behavioral/Psych: Mood is stable, no new changes  All other systems were reviewed with the patient and are negative.  PHYSICAL EXAMINATION: ECOG PERFORMANCE STATUS: 1 - Symptomatic but completely ambulatory  Vitals:   07/23/20 1002  BP: (!) 166/80  Pulse: 69  Resp: 18  Temp: 98.2 F (36.8 C)  SpO2: 98%   Filed Weights   07/23/20 1002  Weight: 199 lb 12 oz (90.6 kg)    GENERAL:alert, no distress and comfortable SKIN: Noted skin bruises EYES: normal, conjunctiva are pink and non-injected, sclera clear OROPHARYNX:no exudate, no erythema and lips, buccal mucosa, and tongue normal  NECK: supple, thyroid normal size, non-tender, without nodularity LYMPH:  no palpable lymphadenopathy in the cervical, axillary or inguinal LUNGS: clear to auscultation and percussion with normal breathing effort HEART: regular rate & rhythm and no murmurs and no lower extremity edema ABDOMEN:abdomen soft, non-tender and normal bowel sounds Musculoskeletal:no cyanosis of digits and no clubbing  PSYCH: alert & oriented x 3 with fluent speech NEURO: no focal motor/sensory deficits  ASSESSMENT & PLAN:  Anemia, iron deficiency The most likely cause of her iron deficiency is likely due to reduced reserve due to chronic history of heavy menstruation when she was premenopausal along with prior history of blood donation GI blood loss cannot be excluded.  I agree with recommendation to proceed with GI work-up Her diet is sporadic and she does not eat much meat or iron rich food She has not been taking her oral iron supplement correctly in the past We discussed the risk and  benefits of continuing oral iron supplement versus IV iron infusion The most likely cause of her anemia is due to chronic blood loss/malabsorption syndrome. We discussed some of the risks, benefits, and alternatives of intravenous iron infusions. The patient is symptomatic from anemia and the iron level is critically low. She tolerated oral iron supplement poorly and desires to achieved higher levels of iron faster for adequate hematopoesis. Some of the side-effects to be expected including risks of infusion reactions, phlebitis, headaches,  nausea and fatigue.  The patient is willing to proceed. Patient education material was dispensed.  Goal is to keep ferritin level greater than 50 and resolution of anemia Ultimately she would like to proceed with IV iron I will schedule 2 doses of IV iron sucrose 300 mg, followed by repeat labs about 6 weeks later and will see her back   Bruising Her skin bruising is likely caused by aspirin therapy and history of NSAID use I recommend the patient to resume aspirin therapy and to avoid NSAID use  History of TIA (transient ischemic attack) I am concerned about her history of TIA I think aspirin can be beneficial for her I will watch for signs of bleeding I discussed with the patient that the benefits of being on aspirin outweighs the risk  Orders Placed This Encounter  Procedures  . Ferritin    Standing Status:   Future    Standing Expiration Date:   07/23/2021  . Iron and TIBC    Standing Status:   Future    Standing Expiration Date:   07/23/2021  . Sedimentation rate    Standing Status:   Future    Standing Expiration Date:   07/23/2021  . CBC with Differential/Platelet    Standing Status:   Future    Standing Expiration Date:   07/23/2021  . Reticulocytes    Standing Status:   Future    Standing Expiration Date:   07/23/2021    All questions were answered. The patient knows to call the clinic with any problems, questions or concerns.  The total  time spent in the appointment was 55 minutes encounter with patients including review of chart and various tests results, discussions about plan of care and coordination of care plan  Heath Lark, MD 5/25/202211:05 AM       Heath Lark, MD 07/23/20 11:05 AM

## 2020-07-23 NOTE — Telephone Encounter (Signed)
07/23/20 spoke with patient and scheduled all appts

## 2020-07-23 NOTE — Assessment & Plan Note (Signed)
I am concerned about her history of TIA I think aspirin can be beneficial for her I will watch for signs of bleeding I discussed with the patient that the benefits of being on aspirin outweighs the risk

## 2020-07-30 ENCOUNTER — Inpatient Hospital Stay: Payer: Medicare PPO | Attending: Oncology

## 2020-07-30 ENCOUNTER — Other Ambulatory Visit: Payer: Self-pay

## 2020-07-30 VITALS — BP 152/56 | HR 64 | Temp 97.8°F | Resp 17 | Wt 202.5 lb

## 2020-07-30 DIAGNOSIS — D509 Iron deficiency anemia, unspecified: Secondary | ICD-10-CM | POA: Diagnosis not present

## 2020-07-30 DIAGNOSIS — D508 Other iron deficiency anemias: Secondary | ICD-10-CM

## 2020-07-30 MED ORDER — SODIUM CHLORIDE 0.9 % IV SOLN
Freq: Once | INTRAVENOUS | Status: AC
  Filled 2020-07-30: qty 250

## 2020-07-30 MED ORDER — SODIUM CHLORIDE 0.9 % IV SOLN
300.0000 mg | Freq: Once | INTRAVENOUS | Status: AC
  Administered 2020-07-30: 300 mg via INTRAVENOUS
  Filled 2020-07-30: qty 10

## 2020-07-30 NOTE — Patient Instructions (Signed)

## 2020-08-04 ENCOUNTER — Ambulatory Visit: Payer: Medicare PPO

## 2020-08-06 ENCOUNTER — Encounter: Payer: Self-pay | Admitting: Hematology and Oncology

## 2020-08-06 ENCOUNTER — Other Ambulatory Visit: Payer: Self-pay

## 2020-08-06 ENCOUNTER — Other Ambulatory Visit: Payer: Self-pay | Admitting: Hematology and Oncology

## 2020-08-06 ENCOUNTER — Inpatient Hospital Stay: Payer: Medicare PPO

## 2020-08-06 VITALS — BP 129/56 | HR 60 | Temp 98.1°F | Resp 18 | Ht 65.0 in | Wt 196.1 lb

## 2020-08-06 DIAGNOSIS — D508 Other iron deficiency anemias: Secondary | ICD-10-CM

## 2020-08-06 DIAGNOSIS — D509 Iron deficiency anemia, unspecified: Secondary | ICD-10-CM | POA: Diagnosis not present

## 2020-08-06 MED ORDER — ONDANSETRON HCL 4 MG PO TABS: 4.0000 mg | ORAL_TABLET | Freq: Three times a day (TID) | ORAL | 0 refills | Status: DC | PRN

## 2020-08-06 MED ORDER — ONDANSETRON HCL 4 MG/2ML IJ SOLN
8.0000 mg | Freq: Once | INTRAMUSCULAR | Status: AC
  Administered 2020-08-06: 8 mg via INTRAVENOUS

## 2020-08-06 MED ORDER — ONDANSETRON HCL 4 MG/2ML IJ SOLN
INTRAMUSCULAR | Status: AC
  Filled 2020-08-06: qty 4

## 2020-08-06 MED ORDER — IRON SUCROSE 20 MG/ML IV SOLN
300.0000 mg | Freq: Once | INTRAVENOUS | Status: AC
  Administered 2020-08-06: 300 mg via INTRAVENOUS
  Filled 2020-08-06: qty 10

## 2020-08-06 MED ORDER — SODIUM CHLORIDE 0.9 % IV SOLN
Freq: Once | INTRAVENOUS | Status: AC
  Filled 2020-08-06: qty 250

## 2020-08-06 NOTE — Patient Instructions (Signed)

## 2020-09-03 ENCOUNTER — Inpatient Hospital Stay: Payer: Medicare PPO

## 2020-09-03 ENCOUNTER — Telehealth: Payer: Self-pay | Admitting: Oncology

## 2020-09-03 NOTE — Telephone Encounter (Signed)
Notified patient of Transfer of Care from Dr Alvy Bimler to Dr Bobby Rumpf for Anemia  Patient scheduled for 10/06/20 Labs 11:15 am - Follow Up w/Dr Bobby Rumpf 11:45 pm (per Dr Bobby Rumpf 15 min Follow Up)

## 2020-10-05 ENCOUNTER — Other Ambulatory Visit: Payer: Self-pay | Admitting: Oncology

## 2020-10-05 DIAGNOSIS — D508 Other iron deficiency anemias: Secondary | ICD-10-CM

## 2020-10-05 NOTE — Progress Notes (Signed)
[place]  [address] [place],  [place]  [address] [phone]  Clinic Day:  10/07/2020  Referring physician: Algis Greenhouse, MD   HISTORY OF PRESENT ILLNESS:  The patient is a 75 y.o. female who has been followed in the past for anemia.  As recent iron parameters were questionable for iron deficiency anemia, the patient received 600 mg of iron sucrose in June 2022.  She comes in today to reassess her hemoglobin.  Overall, the patient claims to be doing okay.  She really has not noticed an improvement in how she has felt since her IV iron was given.  However, she denies having any overt forms of blood loss.    PHYSICAL EXAM:  Blood pressure (!) 184/73, pulse 94, temperature 98.6 F (37 C), resp. rate 16, height 5' 5" (1.651 m), weight 196 lb 9.6 oz (89.2 kg), SpO2 100 %. Wt Readings from Last 3 Encounters:  10/06/20 196 lb 9.6 oz (89.2 kg)  08/06/20 196 lb 1.9 oz (89 kg)  07/30/20 202 lb 8 oz (91.9 kg)   Body mass index is 32.72 kg/m. Performance status (ECOG): 1 - Symptomatic but completely ambulatory Physical Exam Constitutional:      Appearance: Normal appearance. She is not ill-appearing.  HENT:     Mouth/Throat:     Mouth: Mucous membranes are moist.     Pharynx: Oropharynx is clear. No oropharyngeal exudate or posterior oropharyngeal erythema.  Cardiovascular:     Rate and Rhythm: Normal rate and regular rhythm.     Heart sounds: No murmur heard.   No friction rub. No gallop.  Pulmonary:     Effort: Pulmonary effort is normal. No respiratory distress.     Breath sounds: Normal breath sounds. No wheezing, rhonchi or rales.  Chest:  Breasts:    Right: No axillary adenopathy or supraclavicular adenopathy.     Left: No axillary adenopathy or supraclavicular adenopathy.  Abdominal:     General: Bowel sounds are normal. There is no distension.     Palpations: Abdomen is soft. There is no mass.     Tenderness: There is no abdominal  tenderness.  Musculoskeletal:        General: No swelling.     Right lower leg: No edema.     Left lower leg: No edema.  Lymphadenopathy:     Cervical: No cervical adenopathy.     Upper Body:     Right upper body: No supraclavicular or axillary adenopathy.     Left upper body: No supraclavicular or axillary adenopathy.     Lower Body: No right inguinal adenopathy. No left inguinal adenopathy.  Skin:    General: Skin is warm.     Coloration: Skin is not jaundiced.     Findings: No lesion or rash.  Neurological:     General: No focal deficit present.     Mental Status: She is alert and oriented to person, place, and time. Mental status is at baseline.     Cranial Nerves: Cranial nerves are intact.  Psychiatric:        Mood and Affect: Mood normal.        Behavior: Behavior normal.        Thought Content: Thought content normal.    LABS:   CBC Latest Ref Rng & Units 10/06/2020  WBC - 7.3  Hemoglobin 12.0 - 16.0 10.5(A)  Hematocrit 36 - 46 32(A)  Platelets 150 - 399 232   CMP Latest Ref  Rng & Units 10/06/2020 07/14/2020 05/12/2020  Glucose 65 - 99 mg/dL - 104(H) 138(H)  BUN 4 - 21 23(A) 14 14  Creatinine 0.5 - 1.1 1.2(A) 0.85 0.88  Sodium 137 - 147 139 139 141  Potassium 3.4 - 5.3 4.5 4.2 4.0  Chloride 99 - 108 104 101 104  CO2 13 - 22 26(A) 26 26  Calcium 8.7 - 10.7 9.0 8.9 8.5(L)  Alkaline Phos 25 - 125 91 - -  AST 13 - 35 25 - -  ALT 7 - 35 14 - -   Lab Results  Component Value Date   TOTALPROTELP 8.4 10/06/2020   ALBUMINELP 3.6 10/06/2020   A1GS 0.3 10/06/2020   A2GS 0.8 10/06/2020   BETS 0.9 10/06/2020   GAMS 2.8 (H) 10/06/2020   MSPIKE 2.5 (H) 10/06/2020   SPEI Comment 10/06/2020    Ref. Range 10/06/2020 11:26  Iron Latest Ref Range: 28 - 170 ug/dL 83  UIBC Latest Units: ug/dL 214  TIBC Latest Ref Range: 250 - 450 ug/dL 297  Saturation Ratios Latest Ref Range: 10.4 - 31.8 % 28  Ferritin Latest Ref Range: 11 - 307 ng/mL 222  Folate Latest Ref Range: >5.9 ng/mL  8.8  Vitamin B12 Latest Ref Range: 180 - 914 pg/mL 5,349 (H)      Component Value Date/Time   TOTALPROTELP 8.4 10/06/2020 1126   ALBUMINELP 3.6 10/06/2020 1126   A1GS 0.3 10/06/2020 1126   A2GS 0.8 10/06/2020 1126   BETS 0.9 10/06/2020 1126   GAMS 2.8 (H) 10/06/2020 1126   MSPIKE 2.5 (H) 10/06/2020 1126   SPEI Comment 10/06/2020 1126   ASSESSMENT & PLAN:  Assessment/Plan:  A 75 y.o. female with anemia, which has been attributed to low iron.  When evaluating her iron parameters, past and present, they have not been strikingly low.  When evaluating other labs collected today, she clearly has a positive M-spike.  Furthermore, her kidney function has gotten mildly worse.  These lab findings raise the suspicion for multiple myeloma or a similar plasma cell dyscrasia.  I will order quantitative immunoglobulins and serum light chains to better elucidate the reason behind her M-spike.  She ultimately may need a bone marrow biopsy to confirm the presence of multiple myeloma.  I will see her back within the month to reassess her clinical picture.  The patient understands all the plans discussed today and is in agreement with them.      Dequincy Macarthur Critchley, MD

## 2020-10-06 ENCOUNTER — Other Ambulatory Visit: Payer: Self-pay

## 2020-10-06 ENCOUNTER — Telehealth: Payer: Self-pay

## 2020-10-06 ENCOUNTER — Other Ambulatory Visit: Payer: Self-pay | Admitting: Hematology and Oncology

## 2020-10-06 ENCOUNTER — Inpatient Hospital Stay: Payer: Medicare PPO | Attending: Oncology

## 2020-10-06 ENCOUNTER — Other Ambulatory Visit: Payer: Self-pay | Admitting: Oncology

## 2020-10-06 ENCOUNTER — Inpatient Hospital Stay: Payer: Medicare PPO | Admitting: Oncology

## 2020-10-06 VITALS — BP 184/73 | HR 94 | Temp 98.6°F | Resp 16 | Ht 65.0 in | Wt 196.6 lb

## 2020-10-06 DIAGNOSIS — D508 Other iron deficiency anemias: Secondary | ICD-10-CM

## 2020-10-06 DIAGNOSIS — D472 Monoclonal gammopathy: Secondary | ICD-10-CM | POA: Insufficient documentation

## 2020-10-06 DIAGNOSIS — D509 Iron deficiency anemia, unspecified: Secondary | ICD-10-CM | POA: Diagnosis not present

## 2020-10-06 LAB — CBC: RBC: 3.6 — AB (ref 3.87–5.11)

## 2020-10-06 LAB — HEPATIC FUNCTION PANEL
ALT: 14 (ref 7–35)
AST: 25 (ref 13–35)
Alkaline Phosphatase: 91 (ref 25–125)
Bilirubin, Total: 0.8

## 2020-10-06 LAB — BASIC METABOLIC PANEL
BUN: 23 — AB (ref 4–21)
CO2: 26 — AB (ref 13–22)
Chloride: 104 (ref 99–108)
Creatinine: 1.2 — AB (ref 0.5–1.1)
Glucose: 140
Potassium: 4.5 (ref 3.4–5.3)
Sodium: 139 (ref 137–147)

## 2020-10-06 LAB — CBC AND DIFFERENTIAL
HCT: 32 — AB (ref 36–46)
Hemoglobin: 10.5 — AB (ref 12.0–16.0)
Neutrophils Absolute: 3.65
Platelets: 232 (ref 150–399)
WBC: 7.3

## 2020-10-06 LAB — IRON AND TIBC
Iron: 83 ug/dL (ref 28–170)
Saturation Ratios: 28 % (ref 10.4–31.8)
TIBC: 297 ug/dL (ref 250–450)
UIBC: 214 ug/dL

## 2020-10-06 LAB — VITAMIN B12: Vitamin B-12: 5349 pg/mL — ABNORMAL HIGH (ref 180–914)

## 2020-10-06 LAB — COMPREHENSIVE METABOLIC PANEL
Albumin: 4.5 (ref 3.5–5.0)
Calcium: 9 (ref 8.7–10.7)

## 2020-10-06 LAB — FOLATE: Folate: 8.8 ng/mL (ref >5.9)

## 2020-10-06 LAB — FERRITIN: Ferritin: 222 ng/mL (ref 11–307)

## 2020-10-06 NOTE — Telephone Encounter (Signed)
Pt notified that Dr Bobby Rumpf reviewed her labs. He states, "Her numbers look better, will just continue to observe".   Pt voiced understanding.

## 2020-10-07 ENCOUNTER — Encounter: Payer: Self-pay | Admitting: Hematology and Oncology

## 2020-10-07 DIAGNOSIS — D472 Monoclonal gammopathy: Secondary | ICD-10-CM | POA: Insufficient documentation

## 2020-10-07 HISTORY — DX: Monoclonal gammopathy: D47.2

## 2020-10-07 LAB — PROTEIN ELECTROPHORESIS, SERUM
A/G Ratio: 0.8 (ref 0.7–1.7)
Albumin ELP: 3.6 g/dL (ref 2.9–4.4)
Alpha-1-Globulin: 0.3 g/dL (ref 0.0–0.4)
Alpha-2-Globulin: 0.8 g/dL (ref 0.4–1.0)
Beta Globulin: 0.9 g/dL (ref 0.7–1.3)
Gamma Globulin: 2.8 g/dL — ABNORMAL HIGH (ref 0.4–1.8)
Globulin, Total: 4.8 g/dL — ABNORMAL HIGH (ref 2.2–3.9)
M-Spike, %: 2.5 g/dL — ABNORMAL HIGH
Total Protein ELP: 8.4 g/dL (ref 6.0–8.5)

## 2020-10-08 ENCOUNTER — Other Ambulatory Visit: Payer: Self-pay | Admitting: Oncology

## 2020-10-08 ENCOUNTER — Other Ambulatory Visit: Payer: Self-pay | Admitting: Cardiology

## 2020-10-08 DIAGNOSIS — D472 Monoclonal gammopathy: Secondary | ICD-10-CM

## 2020-10-09 ENCOUNTER — Telehealth: Payer: Self-pay | Admitting: Oncology

## 2020-10-09 NOTE — Telephone Encounter (Signed)
Per 8/9 Staff Msg, patient scheduled for 9/8 Labs 1:00 pm - Follow Up 1:30 pm

## 2020-10-10 LAB — IGG, IGA, IGM
IgA: 15 mg/dL — ABNORMAL LOW (ref 64–422)
IgG (Immunoglobin G), Serum: 3255 mg/dL — ABNORMAL HIGH (ref 586–1602)
IgM (Immunoglobulin M), Srm: 13 mg/dL — ABNORMAL LOW (ref 26–217)

## 2020-10-10 LAB — KAPPA/LAMBDA LIGHT CHAINS
Kappa free light chain: 1430.5 mg/L — ABNORMAL HIGH (ref 3.3–19.4)
Kappa, lambda light chain ratio: 117.25 — ABNORMAL HIGH (ref 0.26–1.65)
Lambda free light chains: 12.2 mg/L (ref 5.7–26.3)

## 2020-11-05 ENCOUNTER — Other Ambulatory Visit: Payer: Self-pay | Admitting: Oncology

## 2020-11-05 DIAGNOSIS — D472 Monoclonal gammopathy: Secondary | ICD-10-CM

## 2020-11-06 ENCOUNTER — Inpatient Hospital Stay (INDEPENDENT_AMBULATORY_CARE_PROVIDER_SITE_OTHER): Payer: Medicare PPO | Admitting: Oncology

## 2020-11-06 ENCOUNTER — Inpatient Hospital Stay: Payer: Medicare PPO | Attending: Oncology

## 2020-11-06 ENCOUNTER — Other Ambulatory Visit: Payer: Self-pay

## 2020-11-06 ENCOUNTER — Other Ambulatory Visit: Payer: Self-pay | Admitting: Hematology and Oncology

## 2020-11-06 VITALS — BP 148/65 | HR 58 | Temp 98.8°F | Resp 14 | Ht 65.0 in | Wt 196.1 lb

## 2020-11-06 DIAGNOSIS — C8590 Non-Hodgkin lymphoma, unspecified, unspecified site: Secondary | ICD-10-CM | POA: Insufficient documentation

## 2020-11-06 DIAGNOSIS — R768 Other specified abnormal immunological findings in serum: Secondary | ICD-10-CM | POA: Insufficient documentation

## 2020-11-06 DIAGNOSIS — D6489 Other specified anemias: Secondary | ICD-10-CM

## 2020-11-06 DIAGNOSIS — D649 Anemia, unspecified: Secondary | ICD-10-CM | POA: Diagnosis not present

## 2020-11-06 DIAGNOSIS — D472 Monoclonal gammopathy: Secondary | ICD-10-CM | POA: Diagnosis not present

## 2020-11-06 LAB — CBC
MCV: 88 (ref 81–99)
RBC: 3.7 — AB (ref 3.87–5.11)

## 2020-11-06 LAB — HEPATIC FUNCTION PANEL
ALT: 16 (ref 7–35)
AST: 26 (ref 13–35)
Alkaline Phosphatase: 87 (ref 25–125)
Bilirubin, Total: 0.7

## 2020-11-06 LAB — CBC AND DIFFERENTIAL
HCT: 32 — AB (ref 36–46)
Hemoglobin: 10.7 — AB (ref 12.0–16.0)
Neutrophils Absolute: 3.63
Platelets: 235 (ref 150–399)
WBC: 7.4

## 2020-11-06 LAB — BASIC METABOLIC PANEL
BUN: 17 (ref 4–21)
CO2: 30 — AB (ref 13–22)
Chloride: 103 (ref 99–108)
Creatinine: 1.1 (ref 0.5–1.1)
Glucose: 121
Potassium: 4 (ref 3.4–5.3)
Sodium: 139 (ref 137–147)

## 2020-11-06 LAB — COMPREHENSIVE METABOLIC PANEL
Albumin: 4.5 (ref 3.5–5.0)
Calcium: 9.3 (ref 8.7–10.7)

## 2020-11-07 ENCOUNTER — Inpatient Hospital Stay: Payer: Medicare PPO

## 2020-11-07 ENCOUNTER — Other Ambulatory Visit: Payer: Self-pay | Admitting: Hematology and Oncology

## 2020-11-07 ENCOUNTER — Telehealth: Payer: Self-pay | Admitting: Oncology

## 2020-11-07 ENCOUNTER — Inpatient Hospital Stay (INDEPENDENT_AMBULATORY_CARE_PROVIDER_SITE_OTHER): Payer: Medicare PPO | Admitting: Oncology

## 2020-11-07 ENCOUNTER — Other Ambulatory Visit: Payer: Self-pay | Admitting: Oncology

## 2020-11-07 VITALS — BP 133/60 | HR 65 | Temp 98.7°F | Resp 16 | Ht 65.0 in | Wt 196.0 lb

## 2020-11-07 DIAGNOSIS — D508 Other iron deficiency anemias: Secondary | ICD-10-CM

## 2020-11-07 DIAGNOSIS — C8590 Non-Hodgkin lymphoma, unspecified, unspecified site: Secondary | ICD-10-CM | POA: Diagnosis not present

## 2020-11-07 DIAGNOSIS — D472 Monoclonal gammopathy: Secondary | ICD-10-CM

## 2020-11-07 LAB — CBC AND DIFFERENTIAL
HCT: 31 — AB (ref 36–46)
Hemoglobin: 10.7 — AB (ref 12.0–16.0)
Neutrophils Absolute: 4.41
Platelets: 220 (ref 150–399)
WBC: 7.6

## 2020-11-07 LAB — CBC: RBC: 3.58 — AB (ref 3.87–5.11)

## 2020-11-07 NOTE — Progress Notes (Signed)
BONE MARROW PROCEDURE NOTE Due to the patient's elevated monoclonal parameters and anemia, a bone marrow biopsy was done today for further evaluation.  After consenting for the procedure, the patient's left posterior superior iliac crest was topically sterilized.  Afterwards, a bone marrow core and aspirate were collected, which will be sent for flow cytometry, cytogenetics, and a myeloma FISH panel.  There were no complications experienced with this procedure.

## 2020-11-07 NOTE — Telephone Encounter (Signed)
11/07/20 Spoke with patient and scheduled next appt

## 2020-11-08 LAB — BETA 2 MICROGLOBULIN, SERUM: Beta-2 Microglobulin: 3.9 mg/L — ABNORMAL HIGH (ref 0.6–2.4)

## 2020-11-10 LAB — KAPPA/LAMBDA LIGHT CHAINS
Kappa free light chain: 1413.1 mg/L — ABNORMAL HIGH (ref 3.3–19.4)
Kappa, lambda light chain ratio: 128.46 — ABNORMAL HIGH (ref 0.26–1.65)
Lambda free light chains: 11 mg/L (ref 5.7–26.3)

## 2020-11-10 NOTE — Progress Notes (Signed)
[facility]  [address] [place],  [place]  [address] [phone]  Clinic Day:  11/17/2020  Referring physician: Algis Greenhouse, MD  This document serves as a record of services personally performed by Dequincy Macarthur Critchley, MD. It was created on their behalf by [facility] E, a trained medical scribe. The creation of this record is based on the scribe's personal observations and the provider's statements to them.  HISTORY OF PRESENT ILLNESS:  The patient is a 75 y.o. female with anemia.  As she had a positive M-spike, elevated IgG level, and very elevated kappa light chain levels, a bone marrow biopsy was done to rule out a plasma cell dyscrasia, such as multiple myeloma.  She comes in today to go over her bone marrow biopsy results and their implications.  Since her last visit, the patient has been doing okay.  She admits to having increased fatigue, but denies having any overt forms of blood loss.    PHYSICAL EXAM:  Blood pressure (!) 189/77, pulse (!) 58, temperature 98.6 F (37 C), resp. rate 14, height _0  (1.651 m), weight 193 lb (87.5 kg), SpO2 98 %. Wt Readings from Last 3 Encounters:  11/17/20 193 lb (87.5 kg)  11/07/20 196 lb (88.9 kg)  11/06/20 196 lb 1.6 oz (89 kg)   Body mass index is 32.12 kg/m. Performance status (ECOG): 1 - Symptomatic but completely ambulatory Physical Exam Constitutional:      Appearance: Normal appearance. She is not ill-appearing.  HENT:     Mouth/Throat:     Mouth: Mucous membranes are moist.     Pharynx: Oropharynx is clear. No oropharyngeal exudate or posterior oropharyngeal erythema.  Cardiovascular:     Rate and Rhythm: Normal rate and regular rhythm.     Heart sounds: No murmur heard.   No friction rub. No gallop.  Pulmonary:     Effort: Pulmonary effort is normal. No respiratory distress.     Breath sounds: Normal breath sounds. No wheezing, rhonchi or rales.  Abdominal:     General: Bowel sounds  are normal. There is no distension.     Palpations: Abdomen is soft. There is no mass.     Tenderness: There is no abdominal tenderness.  Musculoskeletal:        General: No swelling.     Right lower leg: No edema.     Left lower leg: No edema.  Lymphadenopathy:     Cervical: No cervical adenopathy.     Upper Body:     Right upper body: No supraclavicular or axillary adenopathy.     Left upper body: No supraclavicular or axillary adenopathy.     Lower Body: No right inguinal adenopathy. No left inguinal adenopathy.  Skin:    General: Skin is warm.     Coloration: Skin is not jaundiced.     Findings: No lesion or rash.  Neurological:     General: No focal deficit present.     Mental Status: She is alert and oriented to person, place, and time. Mental status is at baseline.     Cranial Nerves: Cranial nerves are intact.  Psychiatric:        Mood and Affect: Mood normal.        Behavior: Behavior normal.        Thought Content: Thought content normal.   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  The differential diagnosis would  include a marginal zone lymphoma and lymphoplasmacytic lymphoma.  Given  the patient's IgG kappa monoclonal gammopathy, a marginal zone lymphoma  is slightly favored.  Correlation with molecular studies, cytogenetics  and FISH is recommended.   MICROSCOPIC DESCRIPTION:   PERIPHERAL BLOOD SMEAR: The peripheral blood has a normocytic anemia.  Red blood cell morphology is unremarkable.  Leukocytes are composed of  an admixture of neutrophils and polymorphous lymphocytes.  Platelet  counts are within normal limits but a  scattered giant platelet is  identified.   BONE MARROW ASPIRATE: Spicular, cellular and adequate for evaluation  Erythroid precursors: Orderly maturation without overt dysplasia  Granulocytic precursors: Orderly maturation without overt dysplasia  Megakaryocytes: Qualitatively and quantitatively unremarkable  Lymphocytes/plasma cells: A lymphocytosis is present (32% by manual  differential counts).  The lymphocytes are small to medium in size with  clumped mature chromatin.  Some larger forms are also identified.  Plasma cells are not significantly increased.   TOUCH PREPARATIONS: No additional findings as compared to the aspirate  smears   CLOT AND BIOPSY: Examination of the bone marrow core biopsy and clot  section reveals a hypercellular marrow for age (95%) with a dense  lymphoplasmacytic infiltrate.  By CD20 immunohistochemistry, B cells  comprise 70% of the total marrow cellularity.  The B cells do not  express CD5, CD10, BCL6 or CD23.  CD3 highlights background T cells.  CD138 highlights a plasma cell population which comprises 20-30% of the  total marrow cellularity.  The plasma cells are kappa restricted by  kappa and lambda in situ hybridization.  Background hematopoiesis is  present but diminished.  Megakaryocytes are increased but demonstrate a  spectrum of maturation without clustering.  There are no granulomas  identified   IRON STAIN: Iron stains are performed on a bone marrow aspirate or touch  imprint smear and section of clot. The controls stained appropriately.        Storage Iron: Present       Ring Sideroblasts: Not identified   ADDITIONAL DATA/TESTING: PPJ-09 mutation analysis, cytogenetics and FISH  (small B-cell lymphoma panel) are pending and will be reported  separately.  A monoclonal protein (2.5 g/dL) with IgG kappa specificity  was isolated from the patient serum.   CELL COUNT DATA:   Bone Marrow count performed on 500 cells shows:  Blasts:   0%    Myeloid:  35%  Promyelocytes: 0%   Erythroid:     28%  Myelocytes:    6%   Lymphocytes:   32%  Metamyelocytes:     2%   Plasma cells:  3%  Bands:    7%  Neutrophils:   18%  M:E ratio:     1.24  Eosinophils:   2%  Basophils:     0%  Monocytes:     2%   Lab Data: CBC performed on 11/10/20 shows:  WBC: 7.6 k/uL  Neutrophils:   62%  Hgb: 10.7 g/dL Lymphocytes:   32%  HCT: 31.3 %    Monocytes:     5%  MCV: 87 fL     Eosinophils:   1%  RDW: 14.9 %    Basophils:     0%  PLT: 220  k/uL   LABS:   Lab Results  Component Value Date   TOTALPROTELP 8.6 (H) 11/06/2020   ALBUMINELP 3.6 10/06/2020   A1GS 0.3 10/06/2020   A2GS 0.8 10/06/2020   BETS 0.9 10/06/2020   GAMS 2.8 (H) 10/06/2020   MSPIKE 2.5 (H) 10/06/2020   SPEI Comment 10/06/2020    Ref. Range 11/06/2020 12:59  IgG (Immunoglobin G), Serum Latest Ref Range: 586 - 1,602 mg/dL 3,248 (H)  IgM (Immunoglobulin M), Srm Latest Ref Range: 26 - 217 mg/dL 13 (L)  IgA Latest Ref Range: 64 - 422 mg/dL 14 (L)  Kappa free light chain Latest Ref Range: 3.3 - 19.4 mg/L 1,413.1 (H)  Lambda free light chains Latest Ref Range: 5.7 - 26.3 mg/L 11.0  Kappa, lambda light chain ratio Latest Ref Range: 0.26 - 1.65  128.46 (H)   ASSESSMENT & PLAN:  Assessment/Plan:  A 75 y.o. 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  Studies are being done to determine the exact subtype she has.  However, her bone marrow biopsy clearly did not show multiple myeloma being present.  Because of this diagnosis, I will have her undergo CT scans of her chest/abdomen/pelvis to determine if any occult lymphadenopathy or other findings related to his disease are present that would be a reason to begin some form of systemic chemotherapy in the forthcoming weeks.  I will see her back next week to go over these scans an their implications.   The patient understands all the plans discussed today and is in agreement with  them.    Dequincy Macarthur Critchley, MD

## 2020-11-11 LAB — MULTIPLE MYELOMA PANEL, SERUM
Albumin SerPl Elph-Mcnc: 3.7 g/dL (ref 2.9–4.4)
Albumin/Glob SerPl: 0.8 (ref 0.7–1.7)
Alpha 1: 0.3 g/dL (ref 0.0–0.4)
Alpha2 Glob SerPl Elph-Mcnc: 0.9 g/dL (ref 0.4–1.0)
B-Globulin SerPl Elph-Mcnc: 0.9 g/dL (ref 0.7–1.3)
Gamma Glob SerPl Elph-Mcnc: 2.8 g/dL — ABNORMAL HIGH (ref 0.4–1.8)
Globulin, Total: 4.9 g/dL — ABNORMAL HIGH (ref 2.2–3.9)
IgA: 14 mg/dL — ABNORMAL LOW (ref 64–422)
IgG (Immunoglobin G), Serum: 3248 mg/dL — ABNORMAL HIGH (ref 586–1602)
IgM (Immunoglobulin M), Srm: 13 mg/dL — ABNORMAL LOW (ref 26–217)
M Protein SerPl Elph-Mcnc: 2.5 g/dL — ABNORMAL HIGH
Total Protein ELP: 8.6 g/dL — ABNORMAL HIGH (ref 6.0–8.5)

## 2020-11-16 ENCOUNTER — Encounter: Payer: Self-pay | Admitting: Hematology and Oncology

## 2020-11-16 DIAGNOSIS — D649 Anemia, unspecified: Secondary | ICD-10-CM

## 2020-11-16 HISTORY — DX: Anemia, unspecified: D64.9

## 2020-11-16 NOTE — Progress Notes (Signed)
[facility]  [address] [place],  [place]  [address] [phone]  Clinic Day:  11/06/2020  Referring physician: Algis Greenhouse, MD  This document serves as a record of services personally performed by Dequincy Macarthur Critchley, MD. It was created on their behalf by [facility] E, a trained medical scribe. The creation of this record is based on the scribe's personal observations and the provider's statements to them.  HISTORY OF PRESENT ILLNESS:  The patient is a 75 y.o. female with anemia.  Recent labs also showed an elevated M-spike.  She comes in today to go over her monoclonal studies to determine if she has a more ominous plasma cell dyscrasia, such as multiple myeloma.  Since her last visit, the patient has been doing well.  She denies having bone pain, fatigue or other symptoms which concern her for such a disease process being present.   PHYSICAL EXAM:  Blood pressure (!) 148/65, pulse (!) 58, temperature 98.8 F (37.1 C), resp. rate 14, height 5' 5" (1.651 m), weight 196 lb 1.6 oz (89 kg), SpO2 98 %. Wt Readings from Last 3 Encounters:  11/07/20 196 lb (88.9 kg)  11/06/20 196 lb 1.6 oz (89 kg)  10/06/20 196 lb 9.6 oz (89.2 kg)   Body mass index is 32.63 kg/m. Performance status (ECOG): 1 - Symptomatic but completely ambulatory Physical Exam Constitutional:      Appearance: Normal appearance. She is not ill-appearing.  HENT:     Mouth/Throat:     Mouth: Mucous membranes are moist.     Pharynx: Oropharynx is clear. No oropharyngeal exudate or posterior oropharyngeal erythema.  Cardiovascular:     Rate and Rhythm: Normal rate and regular rhythm.     Heart sounds: No murmur heard.   No friction rub. No gallop.  Pulmonary:     Effort: Pulmonary effort is normal. No respiratory distress.     Breath sounds: Normal breath sounds. No wheezing, rhonchi or rales.  Abdominal:     General: Bowel sounds are normal. There is no distension.      Palpations: Abdomen is soft. There is no mass.     Tenderness: There is no abdominal tenderness.  Musculoskeletal:        General: No swelling.     Right lower leg: No edema.     Left lower leg: No edema.  Lymphadenopathy:     Cervical: No cervical adenopathy.     Upper Body:     Right upper body: No supraclavicular or axillary adenopathy.     Left upper body: No supraclavicular or axillary adenopathy.     Lower Body: No right inguinal adenopathy. No left inguinal adenopathy.  Skin:    General: Skin is warm.     Coloration: Skin is not jaundiced.     Findings: No lesion or rash.  Neurological:     General: No focal deficit present.     Mental Status: She is alert and oriented to person, place, and time. Mental status is at baseline.     Cranial Nerves: Cranial nerves are intact.  Psychiatric:        Mood and Affect: Mood normal.        Behavior: Behavior normal.        Thought Content: Thought content normal.    LABS:    CMP Latest Ref Rng & Units 11/06/2020 10/06/2020 07/14/2020  Glucose 65 - 99 mg/dL - - 104(H)  BUN 4 - 21 17 23(A) 14  Creatinine 0.5 - 1.1 1.1 1.2(A) 0.85  Sodium 137 - 147 139 139 139  Potassium 3.4 - 5.3 4.0 4.5 4.2  Chloride 99 - 108 103 104 101  CO2 13 - 22 30(A) 26(A) 26  Calcium 8.7 - 10.7 9.3 9.0 8.9  Alkaline Phos 25 - 125 87 91 -  AST 13 - 35 26 25 -  ALT 7 - 35 16 14 -   Lab Results  Component Value Date   TOTALPROTELP 8.6 (H) 11/06/2020   ALBUMINELP 3.6 10/06/2020   A1GS 0.3 10/06/2020   A2GS 0.8 10/06/2020   BETS 0.9 10/06/2020   GAMS 2.8 (H) 10/06/2020   MSPIKE 2.5 (H) 10/06/2020   SPEI Comment 10/06/2020   IgG (Immunoglobin G), Serum 586 - 1,602 mg/dL 3,255 High    IgA 64 - 422 mg/dL 15 Low    Comment: Result confirmed on concentration.  IgM (Immunoglobulin M), Srm 26 - 217 mg/dL 13 Low     Ref. Range 11/06/2020 12:59  Kappa free light chain Latest Ref Range: 3.3 - 19.4 mg/L 1,413.1 (H)  Lambda free light chains Latest Ref Range:  5.7 - 26.3 mg/L 11.0  Kappa, lambda light chain ratio Latest Ref Range: 0.26 - 1.65  128.46 (H)    ASSESSMENT & PLAN:  Assessment/Plan:  A 75 y.o. female with anemia, as well as a prominent M-spike.  In addition to this, her IgG level is twice the upper the limit of normal and her kappa:lambda light chain ratio is over 100:1.  Such findings are highly concerning for multiple myeloma being present.  Based upon this, a bone marrow biopsy will be done tomorrow for further evaluation.  I will see her back within the next 2 weeks to go over her bone marrow biopsy results and their implications.  The patient understands all the plans discussed today and is in agreement with them.     I, Rita Ohara, am acting as scribe for [name], MD    I have reviewed this report as typed by the medical scribe, and it is complete and accurate.  Brigetta Beckstrom Macarthur Critchley, MD

## 2020-11-17 ENCOUNTER — Telehealth: Payer: Self-pay | Admitting: Oncology

## 2020-11-17 ENCOUNTER — Inpatient Hospital Stay: Payer: Medicare PPO | Admitting: Oncology

## 2020-11-17 ENCOUNTER — Inpatient Hospital Stay: Payer: Medicare PPO

## 2020-11-17 ENCOUNTER — Encounter (HOSPITAL_COMMUNITY): Payer: Self-pay | Admitting: Oncology

## 2020-11-17 ENCOUNTER — Other Ambulatory Visit: Payer: Self-pay | Admitting: Oncology

## 2020-11-17 DIAGNOSIS — C8309 Small cell B-cell lymphoma, extranodal and solid organ sites: Secondary | ICD-10-CM

## 2020-11-17 DIAGNOSIS — C8590 Non-Hodgkin lymphoma, unspecified, unspecified site: Secondary | ICD-10-CM

## 2020-11-17 DIAGNOSIS — C8300 Small cell B-cell lymphoma, unspecified site: Secondary | ICD-10-CM | POA: Diagnosis not present

## 2020-11-17 HISTORY — DX: Non-Hodgkin lymphoma, unspecified, unspecified site: C85.90

## 2020-11-17 NOTE — Telephone Encounter (Signed)
Per 9/19 LOS, patient scheduled for 9/27 Follow Up - Waiting PreAuth on CT Scan from [facility]

## 2020-11-18 ENCOUNTER — Encounter (HOSPITAL_COMMUNITY): Payer: Self-pay | Admitting: Oncology

## 2020-11-18 NOTE — Progress Notes (Signed)
[facility]  [address] [place],  [place]  [address] [phone]  Clinic Day:  11/25/2020  Referring physician: Algis Greenhouse, MD  This document serves as a record of services personally performed by Dequincy Macarthur Critchley, MD. It was created on their behalf by [facility], a trained medical scribe. The creation of this record is based on the scribe's personal observations and the provider's statements to them.  HISTORY OF PRESENT ILLNESS:  The patient is a 75 y.o. female with anemia.  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  Based upon this, she underwent CT scans to determine if there is evidence of internal disease.  She comes in today to go over her CT scans and their implications.  Since her last visit, the patient has been doing okay.  She admits to having increased fatigue, but denies having any overt forms of blood loss or any B symptoms.  PHYSICAL EXAM:  Blood pressure 139/83, pulse 84, temperature 97.7 F (36.5 C), resp. rate 18, height '5\' 5"'  (1.651 m), weight 194 lb 4.8 oz (88.1 kg), SpO2 98 %. Wt Readings from Last 3 Encounters:  11/25/20 194 lb 4.8 oz (88.1 kg)  11/17/20 193 lb (87.5 kg)  11/07/20 196 lb (88.9 kg)   Body mass index is 32.33 kg/m. Performance status (ECOG): 1 - Symptomatic but completely ambulatory Physical Exam Constitutional:      Appearance: Normal appearance. She is not ill-appearing.  HENT:     Mouth/Throat:     Mouth: Mucous membranes are moist.     Pharynx: Oropharynx is clear. No oropharyngeal exudate or posterior oropharyngeal erythema.  Cardiovascular:     Rate and Rhythm: Normal rate and regular rhythm.     Heart sounds: No murmur heard.   No friction rub. No gallop.  Pulmonary:     Effort: Pulmonary effort is normal. No respiratory distress.     Breath sounds: Normal breath sounds.  No wheezing, rhonchi or rales.  Abdominal:     General: Bowel sounds are normal. There is no distension.     Palpations: Abdomen is soft. There is no mass.     Tenderness: There is no abdominal tenderness.  Musculoskeletal:        General: No swelling.     Right lower leg: No edema.     Left lower leg: No edema.  Lymphadenopathy:     Cervical: No cervical adenopathy.     Upper Body:     Right upper body: No supraclavicular or axillary adenopathy.     Left upper body: No supraclavicular or axillary adenopathy.     Lower Body: No right inguinal adenopathy. No left inguinal adenopathy.  Skin:    General: Skin is warm.     Coloration: Skin is not jaundiced.     Findings: No lesion or rash.  Neurological:     General: No focal deficit present.     Mental Status: She is alert and oriented to person, place, and time. Mental status is at baseline.     Cranial Nerves: Cranial nerves are intact.  Psychiatric:        Mood and Affect: Mood normal.        Behavior: Behavior normal.        Thought Content: Thought content normal.   SCANS: CT chest, abdomen and pelvis with contrast revealed the following:  FINDINGS:  CT CHEST FINDINGS Cardiovascular: Aortic atherosclerosis. Normal heart size. Three-vessel coronary artery calcifications. No pericardial effusion. Mediastinum/Nodes: There are multiple enlarged bilateral axillary lymph nodes, largest left axillary node measuring up to 2.0 x 1.1 cm (series 2, image 17), largest right axillary node measuring up to 2.2 x 1.0 cm (series 2, image 12). No enlarged lower cervical, mediastinal, or hilar lymph nodes. Thyroid gland, trachea, and esophagus demonstrate no significant findings. Lungs/Pleura: Lungs are clear. No pleural effusion or pneumothorax. Musculoskeletal: No chest wall mass or suspicious bone lesions identified.  CT ABDOMEN PELVIS FINDINGS Hepatobiliary: No focal liver abnormality is seen. Status post cholecystectomy. No  biliary dilatation. Pancreas: Unremarkable. No pancreatic ductal dilatation or surrounding inflammatory changes. Spleen: Mild splenomegaly, maximum coronal span 13.8 cm. Adrenals/Urinary Tract: Adrenal glands are unremarkable. Kidneys are normal, without renal calculi, solid lesion, or hydronephrosis. Bladder is unremarkable. Stomach/Bowel: Stomach is within normal limits. Appendix appears normal. No evidence of bowel wall thickening, distention, or inflammatory changes. Descending and sigmoid diverticulosis. Vascular/Lymphatic: Aortic atherosclerosis. No enlarged abdominal or pelvic lymph nodes. Reproductive: Status post hysterectomy. Other: No abdominal wall hernia or abnormality. No abdominopelvic ascites. Musculoskeletal: No acute or significant osseous findings.  IMPRESSION: 1. There are multiple mildly enlarged bilateral axillary lymph nodes, generally in keeping with reported recent diagnosis of lymphoma. 2. No other abnormally enlarged lymph nodes in the chest, abdomen, or pelvis. 3. Mild splenomegaly. 4. Coronary artery disease. Aortic Atherosclerosis (ICD10-I70.0).  ASSESSMENT & PLAN:  Assessment/Plan:  A 75 y.o. 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  Studies are being done to determine the exact subtype she has.  However, her bone marrow biopsy clearly did not show multiple myeloma being present.  Although her scans did not show extensive disease burden, the fact that her marrow is heavily inundated with disease suggest combination therapy needs to be given.  I will place her on Bendamustine/Rituxan for which she is tentatively scheduled to receive 6 cycles of treatment, once every 4 weeks.  She was made aware of the side effects, including nausea, fatigue and low blood counts.  I will arrange for her to have a port placed through which her treatments will be given.  Her first cycle will commence within the  next 7-10 days.  I will see her back 4 weeks later before she heads into her 2nd cycle of treatment.  The patient understands all the plans discussed today and is in agreement with them.     I, Rita Ohara, am acting as scribe for [name], MD    I have reviewed this report as typed by the medical scribe, and it is complete and accurate.  Dequincy Macarthur Critchley, MD

## 2020-11-24 LAB — SURGICAL PATHOLOGY

## 2020-11-25 ENCOUNTER — Other Ambulatory Visit: Payer: Self-pay | Admitting: Hematology and Oncology

## 2020-11-25 ENCOUNTER — Inpatient Hospital Stay (INDEPENDENT_AMBULATORY_CARE_PROVIDER_SITE_OTHER): Payer: Medicare PPO | Admitting: Oncology

## 2020-11-25 ENCOUNTER — Other Ambulatory Visit: Payer: Self-pay | Admitting: Oncology

## 2020-11-25 ENCOUNTER — Inpatient Hospital Stay: Payer: Medicare PPO

## 2020-11-25 DIAGNOSIS — C8309 Small cell B-cell lymphoma, extranodal and solid organ sites: Secondary | ICD-10-CM | POA: Diagnosis not present

## 2020-11-25 DIAGNOSIS — C8590 Non-Hodgkin lymphoma, unspecified, unspecified site: Secondary | ICD-10-CM | POA: Diagnosis not present

## 2020-11-25 LAB — CBC AND DIFFERENTIAL
HCT: 33 — AB (ref 36–46)
Hemoglobin: 11.1 — AB (ref 12.0–16.0)
Neutrophils Absolute: 5.22
Platelets: 215 (ref 150–399)
WBC: 8.7

## 2020-11-25 LAB — HEPATIC FUNCTION PANEL
ALT: 19 (ref 7–35)
AST: 29 (ref 13–35)
Alkaline Phosphatase: 99 (ref 25–125)
Bilirubin, Total: 1.1

## 2020-11-25 LAB — BASIC METABOLIC PANEL
BUN: 16 (ref 4–21)
CO2: 26 — AB (ref 13–22)
Chloride: 102 (ref 99–108)
Creatinine: 1 (ref 0.5–1.1)
Glucose: 121
Potassium: 3.8 (ref 3.4–5.3)
Sodium: 136 — AB (ref 137–147)

## 2020-11-25 LAB — COMPREHENSIVE METABOLIC PANEL
Albumin: 4.3 (ref 3.5–5.0)
Calcium: 9.6 (ref 8.7–10.7)

## 2020-11-25 LAB — CBC
MCV: 87 (ref 81–99)
RBC: 3.81 — AB (ref 3.87–5.11)

## 2020-11-25 LAB — LACTATE DEHYDROGENASE: LDH: 131 U/L (ref 98–192)

## 2020-11-25 NOTE — Progress Notes (Signed)
START ON PATHWAY REGIMEN - Lymphoma and CLL     A cycle is every 28 days:     Rituximab-xxxx      Bendamustine   **Always confirm dose/schedule in your pharmacy ordering system**  Patient Characteristics: Marginal Zone Lymphoma, Systemic, First Line, Symptomatic Disease Type: Marginal Zone Lymphoma Disease Type: Not Applicable Disease Type: Not Applicable Localized or Systemic Disease<= Systemic Line of Therapy: First Line Asymptomatic or Symptomatic<= Symptomatic Intent of Therapy: Non-Curative / Palliative Intent, Discussed with Patient 

## 2020-11-26 ENCOUNTER — Telehealth: Payer: Self-pay | Admitting: Oncology

## 2020-11-26 LAB — HEPATITIS B CORE ANTIBODY, IGM: Hep B C IgM: NONREACTIVE

## 2020-11-26 LAB — HEPATITIS B SURFACE ANTIGEN: Hepatitis B Surface Ag: NONREACTIVE

## 2020-11-26 NOTE — Telephone Encounter (Signed)
11/26/20 Spoke with patient and scheduled all appts.Port placement on 11/28/20@1015am .

## 2020-12-01 ENCOUNTER — Encounter: Payer: Self-pay | Admitting: Oncology

## 2020-12-01 ENCOUNTER — Other Ambulatory Visit: Payer: Self-pay

## 2020-12-01 ENCOUNTER — Inpatient Hospital Stay: Payer: Medicare PPO | Attending: Oncology | Admitting: Hematology and Oncology

## 2020-12-01 VITALS — BP 137/45 | HR 61 | Temp 97.9°F | Resp 18 | Ht 65.0 in | Wt 193.5 lb

## 2020-12-01 DIAGNOSIS — C8300 Small cell B-cell lymphoma, unspecified site: Secondary | ICD-10-CM

## 2020-12-01 DIAGNOSIS — C8590 Non-Hodgkin lymphoma, unspecified, unspecified site: Secondary | ICD-10-CM | POA: Insufficient documentation

## 2020-12-01 DIAGNOSIS — Z5111 Encounter for antineoplastic chemotherapy: Secondary | ICD-10-CM | POA: Insufficient documentation

## 2020-12-01 DIAGNOSIS — C8309 Small cell B-cell lymphoma, extranodal and solid organ sites: Secondary | ICD-10-CM | POA: Diagnosis not present

## 2020-12-01 DIAGNOSIS — Z5112 Encounter for antineoplastic immunotherapy: Secondary | ICD-10-CM | POA: Insufficient documentation

## 2020-12-01 HISTORY — DX: Small cell B-cell lymphoma, unspecified site: C83.00

## 2020-12-01 MED FILL — Dexamethasone Sodium Phosphate Inj 100 MG/10ML: INTRAMUSCULAR | Qty: 1 | Status: AC

## 2020-12-01 MED FILL — Bendamustine HCl IV Soln 100 MG/4ML (25 MG/ML): INTRAVENOUS | Qty: 7 | Status: AC

## 2020-12-01 MED FILL — Rituximab-pvvr IV Soln 500 MG/50ML (10 MG/ML): INTRAVENOUS | Qty: 80 | Status: AC

## 2020-12-01 NOTE — Progress Notes (Signed)
[facility]  [phone] [phone]  Patient Care Team: Algis Greenhouse, MD as PCP - General (Family Medicine)   Name of the patient: Alisha Cochran  [id number]  01/23/1946   Date of visit: 12/01/20  Diagnosis- Non-Hodgkin Lymphoma  Chief complaint/Reason for visit- Initial Meeting for [facility], preparing for starting chemotherapy    Heme/Onc history:  Oncology History  Non-Hodgkin lymphoma ([place])  11/17/2020 Initial Diagnosis   Non-Hodgkin lymphoma ([place])   12/02/2020 -  Chemotherapy   Patient is on Treatment Plan : NON-HODGKINS LYMPHOMA Rituximab D1 / Bendamustine D1,2 q28d       Interval history-  Patient presents to chemo care clinic today for initial meeting in preparation for starting chemotherapy. I introduced the chemo care clinic and we discussed that the role of the clinic is to assist those who are at an increased risk of emergency room visits and/or complications during the course of chemotherapy treatment. We discussed that the increased risk takes into account factors such as age, performance status, and co-morbidities. We also discussed that for some, this might include barriers to care such as not having a primary care provider, lack of insurance/transportation, or not being able to afford medications. We discussed that the goal of the program is to help prevent unplanned ER visits and help reduce complications during chemotherapy. We do this by discussing specific risk factors to each individual and identifying ways that we can help improve these risk factors and reduce barriers to care.   Allergies  Allergen Reactions   Lisinopril Cough    Past Medical History:  Diagnosis Date   Acute pain of right knee 09/04/2019   Formatting of this note might be different from the original. 2021   Allergic rhinitis 09/15/2015   Anemia, iron deficiency 07/21/2015   Arthropathy 09/15/2015   Benign  hypertension 07/21/2015   Chronic pain syndrome 05/22/2015   Degeneration of intervertebral disc of lumbar region 05/22/2015   Overview:  2014: MRI DDD L4/5  Formatting of this note might be different from the original. 2014: MRI DDD L4/5   Diverticulosis of colon 09/15/2015   2014, 2020: diverticulitis   Dyspnea on exertion 10/25/2018   2020   Essential hypertension 05/08/2019   Family history of premature coronary artery disease 04/12/2018   Fibrocystic breast disease 09/15/2015   Herpes zoster without complication 62/13/0865   2020: left T12   Hyperlipemia, mixed 05/18/2015   Insomnia 05/21/2015   Lumbar radiculopathy 09/25/2012   Overview:  2008: onset 2009: MRI DDD with tear and protrusion L5-S1, facet arthropathy L5-S1 2014: MRI L4-5 DDD with progression of L5-S1 changes 2014: Pain eval, limited  Formatting of this note might be different from the original. 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   Obesity (BMI 30-39.9) 03/21/2016   Other intervertebral disc degeneration, lumbar region 05/22/2015   Overview:  2014: MRI DDD L4/5   Overweight 05/08/2019   Peripheral arterial disease ([place]) 09/15/2015   Overview:  2009: right CFA 50%, asx 2017: insurance nurse left 0.86 right 0.94   Pharyngeal lesion 10/08/2016   Overview:  2018: right   Prediabetes 10/20/2018   Radiculopathy of lumbar region 09/25/2012   Overview:  2008: onset 2009: MRI DDD with tear and protrusion L5-S1, facet arthropathy L5-S1 2014: MRI L4-5 DDD with progression of L5-S1  changes 2014: Pain eval, limited   Screening for osteoporosis 09/15/2015   2008: -0.7 2016: -1.6 2019: -1.5  Formatting of this note might be different from the original. 2008: -0.7 2016: -1.6 2019: -1.5  Formatting of this note might be different from the original. 2008: -0.7 2016: -1.6 2019: -1.5 2021: -1.6   Stenosis of  left carotid artery 06/22/2019   Formatting of this note might be different from the original. 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   Wellness examination 09/15/2015    Past Surgical History:  Procedure Laterality Date   BREAST EXCISIONAL BIOPSY Right    x2   BREAST EXCISIONAL BIOPSY Left    x2   CHOLECYSTECTOMY     OVARIAN CYST SURGERY     ROTATOR CUFF REPAIR Right 2008   ROTATOR CUFF REPAIR Left 2006   TONSILLECTOMY     VAGINAL HYSTERECTOMY  1987    Social History   Socioeconomic History   Marital status: Married    Spouse name: Not on file   Number of children: 2   Years of education: Not on file   Highest education level: Not on file  Occupational History   Occupation: retired Consulting civil engineer  Tobacco Use   Smoking status: Former    Types: Cigarettes    Start date: 1966    Quit date: 03/01/1978    Years since quitting: 42.7   Smokeless tobacco: Never  Substance and Sexual Activity   Alcohol use: Yes    Alcohol/week: 14.0 standard drinks    Types: 14 Glasses of wine per week    Comment: with diinner daily for 50 years   Drug use: Never   Sexual activity: Not on file  Other Topics Concern   Not on file  Social History Narrative   Not on file   Social Determinants of Health   Financial Resource Strain: Not on file  Food Insecurity: Not on file  Transportation Needs: Not on file  Physical Activity: Not on file  Stress: Not on file  Social Connections: Not on file  Intimate Partner Violence: Not on file    Family History  Problem Relation Age of Onset   Breast cancer Mother    Breast cancer Maternal Grandmother    Breast cancer Paternal Grandmother    Heart attack Paternal Grandmother    Heart disease Father    Heart attack Paternal Grandfather      Current Outpatient Medications:    aspirin 81 MG chewable tablet, Chew 81 mg by mouth daily., Disp: , Rfl:    calcium-vitamin D (OSCAL WITH D) 500-200  MG-UNIT TABS tablet, Take 1 tablet by mouth daily., Disp: , Rfl:    doxazosin (CARDURA) 2 MG tablet, Take 2 mg by mouth every evening., Disp: , Rfl:    furosemide (LASIX) 40 MG tablet, TAKE 1 TABLET(40 MG) BY MOUTH DAILY, Disp: 30 tablet, Rfl: 3   lansoprazole (PREVACID) 15 MG capsule, Take 15 mg by mouth daily., Disp: , Rfl:    losartan (COZAAR) 100 MG tablet, TAKE 1 TABLET(100 MG) BY MOUTH DAILY, Disp: 90 tablet, Rfl: 2   metoprolol tartrate (LOPRESSOR) 50 MG tablet, Take 50 mg by mouth every 12 (twelve) hours., Disp: , Rfl:    ondansetron (ZOFRAN) 4 MG tablet, Take 1 tablet (4 mg total) by mouth every 8 (eight) hours as needed for nausea or vomiting., Disp: 20 tablet, Rfl: 0   pregabalin (LYRICA) 75 MG capsule,  Take 75 mg by mouth at bedtime., Disp: , Rfl:    rosuvastatin (CRESTOR) 40 MG tablet, Take 40 mg by mouth daily., Disp: , Rfl:   CMP Latest Ref Rng & Units 11/25/2020  Glucose 65 - 99 mg/dL -  BUN 4 - 21 16  Creatinine 0.5 - 1.1 1.0  Sodium 137 - 147 136(A)  Potassium 3.4 - 5.3 3.8  Chloride 99 - 108 102  CO2 13 - 22 26(A)  Calcium 8.7 - 10.7 9.6  Alkaline Phos 25 - 125 99  AST 13 - 35 29  ALT 7 - 35 19   CBC Latest Ref Rng & Units 11/25/2020  WBC - 8.7  Hemoglobin 12.0 - 16.0 11.1(A)  Hematocrit 36 - 46 33(A)  Platelets 150 - 399 215    No images are attached to the encounter.  No results found.   Assessment and plan- Patient is a 75 y.o. female who presents to [facility] for initial meeting in preparation for starting chemotherapy for the treatment of Non-Hodgkin Lymphoma.   Chemo Care Clinic/High Risk for ER/Hospitalization during chemotherapy- We discussed the role of the chemo care clinic and identified patient specific risk factors. I discussed that patient was identified as high risk primarily based on:  Patient has past medical history positive for: Past Medical History:  Diagnosis Date   Acute pain of right knee 09/04/2019   Formatting of this note  might be different from the original. 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 2014: MRI DDD L4/5   Diverticulosis of colon 09/15/2015   2014, 2020: diverticulitis   Dyspnea on exertion 10/25/2018   2020   Essential hypertension 05/08/2019   Family history of premature coronary artery disease 04/12/2018   Fibrocystic breast disease 09/15/2015   Herpes zoster without complication 76/72/0947   2020: left T12   Hyperlipemia, mixed 05/18/2015   Insomnia 05/21/2015   Lumbar radiculopathy 09/25/2012   Overview:  2008: onset 2009: MRI DDD with tear and protrusion L5-S1, facet arthropathy L5-S1 2014: MRI L4-5 DDD with progression of L5-S1 changes 2014: Pain eval, limited  Formatting of this note might be different from the original. 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   Obesity (BMI 30-39.9) 03/21/2016   Other intervertebral disc degeneration, lumbar region 05/22/2015   Overview:  2014: MRI DDD L4/5   Overweight 05/08/2019   Peripheral arterial disease ([place]) 09/15/2015   Overview:  2009: right CFA 50%, asx 2017: insurance nurse left 0.86 right 0.94   Pharyngeal lesion 10/08/2016   Overview:  2018: right   Prediabetes 10/20/2018   Radiculopathy of lumbar region 09/25/2012   Overview:  2008: onset 2009: MRI DDD with tear and protrusion L5-S1, facet arthropathy L5-S1 2014: MRI L4-5 DDD with progression of L5-S1 changes 2014: Pain eval, limited   Screening for osteoporosis 09/15/2015   2008: -0.7 2016: -1.6 2019: -1.5  Formatting of this note might be different from the original. 2008: -0.7  2016: -1.6 2019: -1.5  Formatting of this note might be different from the original. 2008: -0.7 2016: -1.6 2019: -  1.5 2021: -1.6   Stenosis of left carotid artery 06/22/2019   Formatting of this note might be different from the original. 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  Patient presents to clinic today for chemotherapy education and palliative care consult.  We will start Bendamustine/ Rituximab  We will send in prescriptions for prochlorperazine and ondansetron.  The patient verbalizes understanding of and agreement to the plan as discussed today.  Provided general information including the following: 1.  Date of education: 12/01/2020 2.  Physician name: Dr. Bobby Rumpf 3.  Diagnosis: Non-Hodgkin's Lymphoma 4.  Stage: 5.  Control 6.  Chemotherapy plan including drugs and how often: Bendamustine/ Ritixumab 7.  Start date: 12/02/2020 8.  Other referrals: None at this time 9.  The patient is to call our office with any questions or concerns.  Our office number [phone], if after hours or on the weekend, call the same number and wait for the answering service.  There is always an oncologist on call 10.  Medications prescribed: Ondansetron/ Prochlorperazine 11.  The patient has verbalized understanding of the treatment plan and has no barriers to adherence or understanding.  Obtained signed consent from patient.  Discussed symptoms including 1.  Low blood counts including red blood cells, white blood cells and  platelets. 2. Infection including to avoid large crowds, wash hands frequently, and stay away from people who were sick.  If fever develops of 100.4 or higher, call our office. 3.  Mucositis-given instructions on mouth rinse (baking soda and salt mixture).  Keep mouth clean.  Use soft bristle toothbrush.  If mouth sores develop, call our clinic. 4.  Nausea/vomiting-gave prescriptions for ondansetron 4 mg every 4 hours as needed for nausea, may take around the clock if persistent.  Compazine 10 mg every 6 hours, may take around the clock if persistent. 5.  Diarrhea-use over-the-counter Imodium.  Call clinic if not controlled. 6.  Constipation-use senna, 1 to 2 tablets twice a day.  If no BM in 2 to 3 days call the clinic. 7.  Loss of appetite-try to eat small meals every 2-3 hours.  Call clinic if not eating. 8.  Taste changes-zinc 500 mg daily.  If becomes severe call clinic. 9.  Alcoholic beverages. 10.  Drink 2 to 3 quarts of water per day. 11.  Peripheral neuropathy-patient to call if numbness or tingling in hands or feet is persistent  Neulasta-will be given 24 to 48 hours after chemotherapy.  Gave information sheet on bone and joint pain.  Use Claritin or Pepcid.  May use ibuprofen or Aleve.  Call if symptoms persist or are unbearable.   Answered questions to patient satisfaction.  Patient is to call with any further questions or concerns.    The medication prescribed to the patient will be printed out from [email or web address] This will give the following information: Name of your medication Approved uses Dose and schedule Storage and handling Handling  body fluids and waste Drug and food interactions Possible side effects and management Pregnancy, sexual activity, and contraception Obtaining medication   We discussed that social determinants of health may have significant impacts on health and outcomes for cancer patients.  Today we discussed specific social determinants of  performance status, alcohol use, depression, financial needs, food insecurity, housing, interpersonal violence, social connections, stress, tobacco use, and transportation.    After lengthy discussion the following were identified as areas of need:   Outpatient services: We discussed options including home based and outpatient services, DME and care program. We discusssed that patients who participate in regular physical activity report fewer negative impacts of cancer and treatments and report less fatigue.   Financial Concerns: We discussed that living with cancer can create tremendous financial burden.  We discussed options for assistance. I asked that if assistance is needed in affording medications or paying bills to please let us know so that we can provide assistance. We discussed options for food including social services, [employer] ($50 every 2 weeks) and onsite food pantry.  We will also notify [facility] to see if cancer center can provide additional support.  Referral to Social work: Introduced Education officer, museum Elease Etienne and the services he can provide such as support with utility bill, cell phone and gas vouchers.   Support groups: We discussed options for support groups at the cancer center. If interested, please notify nurse navigator to enroll. We discussed options for managing stress including healthy eating, exercise as well as participating in no charge counseling services at the cancer center and support groups.  If these are of interest, patient can notify either myself or primary nursing team.We discussed options for management including medications and referral to quit Smart program  Transportation: We discussed options for transportation including [employer], paratransit, bus routes, link transit, taxi/uber/lyft, and cancer center van.  I have notified primary oncology team who will help assist with arranging Lucianne Lei transportation for appointments when/if needed. We also  discussed options for transportation on short notice/acute visits.  Palliative care services: We have palliative care services available in the cancer center to discuss goals of care and advanced care planning.  Please let us know if you have any questions or would like to speak to our palliative nurse practitioner.  Symptom Management Clinic: We discussed our symptom management clinic which is available for acute concerns while receiving treatment such as nausea, vomiting or diarrhea.  We can be reached via telephone at [phone] or through my chart.  We are available for virtual or in person visits on the same day from 830 to 4 PM Monday through Friday. She denies needing specific assistance at this time and She will be followed by Dr. Bobby Rumpf clinical team.  Plan: Discussed symptom management clinic. Discussed palliative care services. Discussed resources that are available here at the cancer center. Discussed medications and new prescriptions to begin treatment such as anti-nausea or steroids.   Disposition: RTC on   Visit Diagnosis No diagnosis found.  Patient expressed understanding and was in agreement with this plan. She also understands that She can call clinic at any time with any questions, concerns, or complaints.   I provided 30 minutes of  face to face  during this encounter, and > 50% was spent counseling as documented under my assessment & plan.   Dayton Scrape, [place]

## 2020-12-01 NOTE — Progress Notes (Signed)
..  Pharmacist Chemotherapy Monitoring - Initial Assessment    Anticipated start date: 12/02/2020  The following has been reviewed per standard work regarding the patient's treatment regimen: The patient's diagnosis, treatment plan and drug doses, and organ/hematologic function Lab orders and baseline tests specific to treatment regimen  The treatment plan start date, drug sequencing, and pre-medications Prior authorization status  Patient's documented medication list, including drug-drug interaction screen and prescriptions for anti-emetics and supportive care specific to the treatment regimen The drug concentrations, fluid compatibility, administration routes, and timing of the medications to be used The patient's access for treatment and lifetime cumulative dose history, if applicable  The patient's medication allergies and previous infusion related reactions, if applicable   Changes made to treatment plan:  N/A  Follow up needed:  N/A   Juanetta Beets, [facility], 12/01/2020  3:25 PM

## 2020-12-02 ENCOUNTER — Other Ambulatory Visit: Payer: Self-pay | Admitting: Hematology and Oncology

## 2020-12-02 ENCOUNTER — Inpatient Hospital Stay: Payer: Medicare PPO

## 2020-12-02 ENCOUNTER — Encounter: Payer: Self-pay | Admitting: Oncology

## 2020-12-02 ENCOUNTER — Other Ambulatory Visit: Payer: Self-pay | Admitting: Pharmacist

## 2020-12-02 VITALS — BP 117/42 | HR 80 | Temp 98.5°F | Resp 16 | Ht 65.0 in | Wt 198.0 lb

## 2020-12-02 DIAGNOSIS — C8309 Small cell B-cell lymphoma, extranodal and solid organ sites: Secondary | ICD-10-CM

## 2020-12-02 DIAGNOSIS — Z5112 Encounter for antineoplastic immunotherapy: Secondary | ICD-10-CM | POA: Diagnosis not present

## 2020-12-02 DIAGNOSIS — C8590 Non-Hodgkin lymphoma, unspecified, unspecified site: Secondary | ICD-10-CM | POA: Diagnosis present

## 2020-12-02 DIAGNOSIS — Z5111 Encounter for antineoplastic chemotherapy: Secondary | ICD-10-CM | POA: Diagnosis present

## 2020-12-02 MED ORDER — ACETAMINOPHEN 325 MG PO TABS
650.0000 mg | ORAL_TABLET | Freq: Once | ORAL | Status: AC
  Administered 2020-12-02: 650 mg via ORAL
  Filled 2020-12-02: qty 2

## 2020-12-02 MED ORDER — SODIUM CHLORIDE 0.9 % IV SOLN
375.0000 mg/m2 | Freq: Once | INTRAVENOUS | Status: AC
  Administered 2020-12-02: 800 mg via INTRAVENOUS
  Filled 2020-12-02: qty 50

## 2020-12-02 MED ORDER — HEPARIN SOD (PORK) LOCK FLUSH 100 UNIT/ML IV SOLN
500.0000 [IU] | Freq: Once | INTRAVENOUS | Status: AC | PRN
  Administered 2020-12-02: 500 [IU]

## 2020-12-02 MED ORDER — PROCHLORPERAZINE MALEATE 10 MG PO TABS: 10.0000 mg | ORAL_TABLET | Freq: Four times a day (QID) | ORAL | 3 refills | Status: DC | PRN

## 2020-12-02 MED ORDER — SODIUM CHLORIDE 0.9 % IV SOLN
90.0000 mg/m2 | Freq: Once | INTRAVENOUS | Status: AC
  Administered 2020-12-02: 175 mg via INTRAVENOUS
  Filled 2020-12-02: qty 7

## 2020-12-02 MED ORDER — PALONOSETRON HCL INJECTION 0.25 MG/5ML
0.2500 mg | Freq: Once | INTRAVENOUS | Status: AC
  Administered 2020-12-02: 0.25 mg via INTRAVENOUS
  Filled 2020-12-02: qty 5

## 2020-12-02 MED ORDER — MEPERIDINE HCL 25 MG/ML IJ SOLN
12.5000 mg | Freq: Once | INTRAMUSCULAR | Status: AC
  Administered 2020-12-02: 12.5 mg via INTRAVENOUS

## 2020-12-02 MED ORDER — SODIUM CHLORIDE 0.9 % IV SOLN: Freq: Once | INTRAVENOUS | Status: AC

## 2020-12-02 MED ORDER — ACETAMINOPHEN 325 MG PO TABS
650.0000 mg | ORAL_TABLET | Freq: Once | ORAL | Status: AC
Start: 2020-12-02 — End: 2020-12-02
  Administered 2020-12-02: 650 mg via ORAL
  Filled 2020-12-02: qty 2

## 2020-12-02 MED ORDER — MEPERIDINE HCL 25 MG/ML IJ SOLN
INTRAMUSCULAR | Status: AC
  Filled 2020-12-02: qty 1

## 2020-12-02 MED ORDER — DIPHENHYDRAMINE HCL 25 MG PO CAPS
50.0000 mg | ORAL_CAPSULE | Freq: Once | ORAL | Status: AC
  Administered 2020-12-02: 50 mg via ORAL
  Filled 2020-12-02: qty 2

## 2020-12-02 MED ORDER — SODIUM CHLORIDE 0.9 % IV SOLN
10.0000 mg | Freq: Once | INTRAVENOUS | Status: AC
  Administered 2020-12-02: 10 mg via INTRAVENOUS
  Filled 2020-12-02: qty 10

## 2020-12-02 MED ORDER — ONDANSETRON HCL 4 MG PO TABS: 4.0000 mg | ORAL_TABLET | ORAL | 3 refills | Status: DC | PRN

## 2020-12-02 MED ORDER — DIPHENHYDRAMINE HCL 25 MG PO CAPS
25.0000 mg | ORAL_CAPSULE | Freq: Once | ORAL | Status: AC
  Administered 2020-12-02: 25 mg via ORAL
  Filled 2020-12-02: qty 1

## 2020-12-02 MED ORDER — SODIUM CHLORIDE 0.9% FLUSH
10.0000 mL | INTRAVENOUS | Status: DC | PRN
  Administered 2020-12-02: 10 mL

## 2020-12-02 MED FILL — Dexamethasone Sodium Phosphate Inj 100 MG/10ML: INTRAMUSCULAR | Qty: 1 | Status: AC

## 2020-12-02 MED FILL — Bendamustine HCl IV Soln 100 MG/4ML (25 MG/ML): INTRAVENOUS | Qty: 7 | Status: AC

## 2020-12-02 NOTE — Progress Notes (Signed)
1202- Patient reported "shivering" rigors noted- PA Dickey Gave in to see patient. Rituximab stopped and NS started at 100 cc/hr. Rigors treated with 12.5 mg IV Demerol and rigors resolved promptly- No distress noted. Skin warm, dry, denies pain or shortness of breath.   1230 no rigors noted, NS continues. Rituximab remains off.   1300- Rituximab restarted at 200mg /hr. NS decreased to 20 ml/hr.  1330- Patient continues to tolerate infusion well, no rigors noted.

## 2020-12-02 NOTE — Patient Instructions (Signed)
[facility]  Discharge Instructions: Thank you for choosing [facility] to provide your oncology and hematology care.  If you have a lab appointment with the [facility], please go directly to the [facility] and check in at the registration area.   Wear comfortable clothing and clothing appropriate for easy access to any Portacath or PICC line.   We strive to give you quality time with your provider. You may need to reschedule your appointment if you arrive late (15 or more minutes).  Arriving late affects you and other patients whose appointments are after yours.  Also, if you miss three or more appointments without notifying the office, you may be dismissed from the clinic at the provider's discretion.      For prescription refill requests, have your pharmacy contact our office and allow 72 hours for refills to be completed.    Today you received the following chemotherapy and/or immunotherapy agents:Bendamustine and  RituxanRituximab; Hyaluronidase injection What is this medication? RITUXIMAB; HYALURONIDASE (ri TUX i mab / hye al ur ON i dase) is used to treat non-Hodgkin lymphoma and chronic lymphocytic leukemia. Rituximab is a monoclonal antibody. Hyaluronidase is used to improve the effects of rituximab. This medicine may be used for other purposes; ask your health care provider or pharmacist if you have questions. COMMON BRAND NAME(S): Rituxan Hycela What should I tell my care team before I take this medication? They need to know if you have any of these conditions: heart disease infection (especially a virus infection such as hepatitis B, chickenpox, cold sores, or herpes) immune system problems irregular heartbeat kidney disease lung or breathing disease, like asthma recently received or scheduled to receive a vaccine an unusual or allergic reaction to rituximab, rituximab/hyaluronidase, mouse proteins, other medicines, foods, dyes, or  preservatives pregnant or trying to get pregnant breast-feeding How should I use this medication? This medicine is for injection under the skin. It is given by a health care professional in a hospital or clinic setting. A special MedGuide will be given to you before each treatment. Be sure to read this information carefully each time. Talk to your pediatrician regarding the use of this medicine in children. Special care may be needed. Overdosage: If you think you have taken too much of this medicine contact a poison control center or emergency room at once. NOTE: This medicine is only for you. Do not share this medicine with others. What if I miss a dose? It is important not to miss your dose. Call your doctor or health care professional if you are unable to keep an appointment. What may interact with this medication? This medicine may interact with the following medications: cisplatin live virus vaccines This list may not describe all possible interactions. Give your health care provider a list of all the medicines, herbs, non-prescription drugs, or dietary supplements you use. Also tell them if you smoke, drink alcohol, or use illegal drugs. Some items may interact with your medicine. What should I watch for while using this medication? Your condition will be monitored carefully while you are receiving this medicine. You may need blood work done while you are taking this medicine. This medicine can cause serious allergic reactions. To reduce your risk you may need to take medicine before treatment with this medicine. Take your medicine as directed. In some patients, this medicine may cause a serious brain infection that may cause death. If you have any problems seeing, thinking, speaking, walking, or standing,  tell your doctor right away. If you cannot reach your doctor, urgently seek other source of medical care. Call your doctor or health care professional for advice if you get a fever,  chills or sore throat, or other symptoms of a cold or flu. Do not treat yourself. This drug decreases your body's ability to fight infections. Try to avoid being around people who are sick. Do not become pregnant while taking this medicine or for 12 months after stopping it. Women should inform their doctor if they wish to become pregnant or think they might be pregnant. There is a potential for serious side effects to an unborn child. Talk to your health care professional or pharmacist for more information. Do not breast-feed an infant while taking this medicine or for at least 6 months after stopping it. What side effects may I notice from receiving this medication? Side effects that you should report to your doctor or health care professional as soon as possible: allergic reactions like skin rash, itching or hives; swelling of the face, lips, or tongue breathing problems chest pain changes in vision diarrhea dizziness headache with fever, neck stiffness, sensitivity to light, nausea, or confusion fast, irregular heartbeat loss of memory low blood counts - this medicine may decrease the number of white blood cells, red blood cells and platelets. You may be at increased risk for infections and bleeding. mouth sores problems with balance, talking, or walking redness, blistering, peeling or loosening of the skin, including inside the mouth signs of infection - fever or chills, cough, sore throat, pain or difficulty passing urine signs and symptoms of kidney injury like trouble passing urine or change in the amount of urine signs and symptoms of liver injury like dark yellow or brown urine; general ill feeling or flu-like symptoms; light-colored stools; loss of appetite; nausea; right upper belly pain; unusually weak or tired; yellowing of the eyes or skin stomach pain swelling of the ankles, feet, hands unusual bleeding or bruising vomiting Side effects that usually do not require medical  attention (report these to your doctor or health care professional if they continue or are bothersome): headache joint pain muscle cramps or muscle pain nausea pain, redness, or irritation at site where injected tiredness This list may not describe all possible side effects. Call your doctor for medical advice about side effects. You may report side effects to FDA at 1-800-FDA-1088. Where should I keep my medication? This drug is given in a hospital or clinic and will not be stored at home. NOTE: This sheet is a summary. It may not cover all possible information. If you have questions about this medicine, talk to your doctor, pharmacist, or health care provider.  2022 Elsevier/Gold Standard (2017-01-28 13:12:25) Bendamustine Injection What is this medication? BENDAMUSTINE (BEN da MUS teen) is a chemotherapy drug. It is used to treat chronic lymphocytic leukemia and non-Hodgkin lymphoma. This medicine may be used for other purposes; ask your health care provider or pharmacist if you have questions. COMMON BRAND NAME(S): Kristine Royal, Treanda What should I tell my care team before I take this medication? They need to know if you have any of these conditions: infection (especially a virus infection such as chickenpox, cold sores, or herpes) kidney disease liver disease an unusual or allergic reaction to bendamustine, mannitol, other medicines, foods, dyes, or preservatives pregnant or trying to get pregnant breast-feeding How should I use this medication? This medicine is for infusion into a vein. It is given by a health care professional  in a hospital or clinic setting. Talk to your pediatrician regarding the use of this medicine in children. Special care may be needed. Overdosage: If you think you have taken too much of this medicine contact a poison control center or emergency room at once. NOTE: This medicine is only for you. Do not share this medicine with others. What if I miss  a dose? It is important not to miss your dose. Call your doctor or health care professional if you are unable to keep an appointment. What may interact with this medication? Do not take this medicine with any of the following medications: clozapine This medicine may also interact with the following medications: atazanavir cimetidine ciprofloxacin enoxacin fluvoxamine medicines for seizures like carbamazepine and phenobarbital mexiletine rifampin tacrine thiabendazole zileuton This list may not describe all possible interactions. Give your health care provider a list of all the medicines, herbs, non-prescription drugs, or dietary supplements you use. Also tell them if you smoke, drink alcohol, or use illegal drugs. Some items may interact with your medicine. What should I watch for while using this medication? This drug may make you feel generally unwell. This is not uncommon, as chemotherapy can affect healthy cells as well as cancer cells. Report any side effects. Continue your course of treatment even though you feel ill unless your doctor tells you to stop. You may need blood work done while you are taking this medicine. Call your doctor or healthcare provider for advice if you get a fever, chills or sore throat, or other symptoms of a cold or flu. Do not treat yourself. This drug decreases your body's ability to fight infections. Try to avoid being around people who are sick. This medicine may cause serious skin reactions. They can happen weeks to months after starting the medicine. Contact your healthcare provider right away if you notice fevers or flu-like symptoms with a rash. The rash may be red or purple and then turn into blisters or peeling of the skin. Or, you might notice a red rash with swelling of the face, lips or lymph nodes in your neck or under your arms. In some patients, this medicine may cause a serious brain infection that may cause death. If you have any problems  seeing, thinking, speaking, walking, or standing, tell your health care provider right away. If you cannot reach your health care provider, urgently seek other source of medical care. This medicine may increase your risk to bruise or bleed. Call your doctor or healthcare provider if you notice any unusual bleeding. Talk to your doctor about your risk of cancer. You may be more at risk for certain types of cancers if you take this medicine. This medicine may increase your risk of skin cancer. Check your skin for changes to moles or for new growths while taking this medicine. Call your health care provider if you notice any of these skin changes. Do not become pregnant while taking this medicine or for at least 6 months after stopping it. Women should inform their doctor if they wish to become pregnant or think they might be pregnant. Men should not father a child while taking this medicine and for at least 3 months after stopping it. There is a potential for serious side effects to an unborn child. Talk to your healthcare provider or pharmacist for more information. Do not breast-feed an infant while taking this medicine or for at least 1 week after stopping it. This medicine may make it more difficult to father a  child. You should talk with your doctor or healthcare provider if you are concerned about your fertility. What side effects may I notice from receiving this medication? Side effects that you should report to your doctor or health care professional as soon as possible: allergic reactions like skin rash, itching or hives, swelling of the face, lips, or tongue low blood counts - this medicine may decrease the number of white blood cells, red blood cells and platelets. You may be at increased risk for infections and bleeding. rash, fever, and swollen lymph nodes redness, blistering, peeling, or loosening of the skin, including inside the mouth signs of infection like fever or chills, cough, sore  throat, pain or difficulty passing urine signs of decreased platelets or bleeding like bruising, pinpoint red spots on the skin, black, tarry stools, blood in the urine signs of decreased red blood cells like being unusually weak or tired, fainting spells, lightheadedness signs and symptoms of kidney injury like trouble passing urine or change in the amount of urine signs and symptoms of liver injury like dark yellow or brown urine; general ill feeling or flu-like symptoms; light-colored stools; loss of appetite; nausea; right upper belly pain; unusually weak or tired; yellowing of the eyes or skin Side effects that usually do not require medical attention (report to your doctor or health care professional if they continue or are bothersome): constipation decreased appetite diarrhea headache mouth sores nausea, vomiting tiredness This list may not describe all possible side effects. Call your doctor for medical advice about side effects. You may report side effects to FDA at 1-800-FDA-1088. Where should I keep my medication? This drug is given in a hospital or clinic and will not be stored at home. NOTE: This sheet is a summary. It may not cover all possible information. If you have questions about this medicine, talk to your doctor, pharmacist, or health care provider.  2022 Elsevier/Gold Standard (2019-08-13 12:11:43)    To help prevent nausea and vomiting after your treatment, we encourage you to take your nausea medication as directed.  BELOW ARE SYMPTOMS THAT SHOULD BE REPORTED IMMEDIATELY: *FEVER GREATER THAN 100.4 F (38 C) OR HIGHER *CHILLS OR SWEATING *NAUSEA AND VOMITING THAT IS NOT CONTROLLED WITH YOUR NAUSEA MEDICATION *UNUSUAL SHORTNESS OF BREATH *UNUSUAL BRUISING OR BLEEDING *URINARY PROBLEMS (pain or burning when urinating, or frequent urination) *BOWEL PROBLEMS (unusual diarrhea, constipation, pain near the anus) TENDERNESS IN MOUTH AND THROAT WITH OR WITHOUT PRESENCE  OF ULCERS (sore throat, sores in mouth, or a toothache) UNUSUAL RASH, SWELLING OR PAIN  UNUSUAL VAGINAL DISCHARGE OR ITCHING   Items with * indicate a potential emergency and should be followed up as soon as possible or go to the Emergency Department if any problems should occur.  Please show the CHEMOTHERAPY ALERT CARD or IMMUNOTHERAPY ALERT CARD at check-in to the Emergency Department and triage nurse.  Should you have questions after your visit or need to cancel or reschedule your appointment, please contact [facility]  Dept: [phone]  and follow the prompts.  Office hours are 8:00 a.m. to 4:30 p.m. Monday - Friday. Please note that voicemails left after 4:00 p.m. may not be returned until the following business day.  We are closed weekends and major holidays. You have access to a nurse at all times for urgent questions. Please call the main number to the clinic Dept: [phone] and follow the prompts.  For any non-urgent questions, you may also contact your provider using MyChart. We  now offer e-Visits for anyone 51 and older to request care online for non-urgent symptoms. For details visit [email or web address].   Also download the MyChart app! Go to the app store, search "MyChart", open the app, select [facility], and log in with your MyChart username and password.  Due to Covid, a mask is required upon entering the hospital/clinic. If you do not have a mask, one will be given to you upon arrival. For doctor visits, patients may have 1 support person aged 55 or older with them. For treatment visits, patients cannot have anyone with them due to current Covid guidelines and our immunocompromised population.

## 2020-12-02 NOTE — Progress Notes (Signed)
Patient discharged home, stable. Patient states understanding to call for any changes.

## 2020-12-03 ENCOUNTER — Inpatient Hospital Stay: Payer: Medicare PPO

## 2020-12-03 ENCOUNTER — Other Ambulatory Visit: Payer: Self-pay

## 2020-12-03 VITALS — BP 136/46 | HR 68 | Temp 97.7°F | Resp 16 | Ht 65.0 in | Wt 200.0 lb

## 2020-12-03 DIAGNOSIS — C8309 Small cell B-cell lymphoma, extranodal and solid organ sites: Secondary | ICD-10-CM

## 2020-12-03 DIAGNOSIS — Z5112 Encounter for antineoplastic immunotherapy: Secondary | ICD-10-CM | POA: Diagnosis not present

## 2020-12-03 MED ORDER — SODIUM CHLORIDE 0.9 % IV SOLN
90.0000 mg/m2 | Freq: Once | INTRAVENOUS | Status: AC
  Administered 2020-12-03: 175 mg via INTRAVENOUS
  Filled 2020-12-03: qty 7

## 2020-12-03 MED ORDER — SODIUM CHLORIDE 0.9% FLUSH
10.0000 mL | INTRAVENOUS | Status: DC | PRN
  Administered 2020-12-03: 10 mL

## 2020-12-03 MED ORDER — SODIUM CHLORIDE 0.9 % IV SOLN
10.0000 mg | Freq: Once | INTRAVENOUS | Status: AC
  Administered 2020-12-03: 10 mg via INTRAVENOUS
  Filled 2020-12-03: qty 10

## 2020-12-03 MED ORDER — SODIUM CHLORIDE 0.9 % IV SOLN: Freq: Once | INTRAVENOUS | Status: AC

## 2020-12-03 MED ORDER — HEPARIN SOD (PORK) LOCK FLUSH 100 UNIT/ML IV SOLN
500.0000 [IU] | Freq: Once | INTRAVENOUS | Status: AC | PRN
  Administered 2020-12-03: 500 [IU]

## 2020-12-03 NOTE — Progress Notes (Signed)
Hypersensitivity Reaction note  Date of event: 12/02/2020 Time of event: 1202 Generic name of drug involved: Rituximab Name of provider notified of the hypersensitivity reaction: Dickey Gave PA Was agent that likely caused hypersensitivity reaction added to Allergies List within EMR? Rituximab Chain of events including reaction signs/symptoms, treatment administered, and outcome (e.g., drug resumed; drug discontinued; sent to Emergency Department; etc.). Patient reported shivering, rigors noted, Demerol 12.5mg  IV slow push given. Symptoms resolved and med held for 58 minutes. Med restarted at 1300 and patient tolerated treatment with no further signs and symptoms  Maxwell Marion, RN 12/03/2020 8:29 AM

## 2020-12-03 NOTE — Patient Instructions (Signed)
[facility]  Discharge Instructions: Thank you for choosing [facility] to provide your oncology and hematology care.  If you have a lab appointment with the [facility], please go directly to the [facility] and check in at the registration area.   Wear comfortable clothing and clothing appropriate for easy access to any Portacath or PICC line.   We strive to give you quality time with your provider. You may need to reschedule your appointment if you arrive late (15 or more minutes).  Arriving late affects you and other patients whose appointments are after yours.  Also, if you miss three or more appointments without notifying the office, you may be dismissed from the clinic at the provider's discretion.      For prescription refill requests, have your pharmacy contact our office and allow 72 hours for refills to be completed.    Today you received the following chemotherapy and/or immunotherapy agents:Bendamustine   To help prevent nausea and vomiting after your treatment, we encourage you to take your nausea medication as directed.  BELOW ARE SYMPTOMS THAT SHOULD BE REPORTED IMMEDIATELY: *FEVER GREATER THAN 100.4 F (38 C) OR HIGHER *CHILLS OR SWEATING *NAUSEA AND VOMITING THAT IS NOT CONTROLLED WITH YOUR NAUSEA MEDICATION *UNUSUAL SHORTNESS OF BREATH *UNUSUAL BRUISING OR BLEEDING *URINARY PROBLEMS (pain or burning when urinating, or frequent urination) *BOWEL PROBLEMS (unusual diarrhea, constipation, pain near the anus) TENDERNESS IN MOUTH AND THROAT WITH OR WITHOUT PRESENCE OF ULCERS (sore throat, sores in mouth, or a toothache) UNUSUAL RASH, SWELLING OR PAIN  UNUSUAL VAGINAL DISCHARGE OR ITCHING   Items with * indicate a potential emergency and should be followed up as soon as possible or go to the Emergency Department if any problems should occur.  Please show the CHEMOTHERAPY ALERT CARD or IMMUNOTHERAPY ALERT CARD at check-in to the  Emergency Department and triage nurse.  Should you have questions after your visit or need to cancel or reschedule your appointment, please contact [facility]  Dept: [phone]  and follow the prompts.  Office hours are 8:00 a.m. to 4:30 p.m. Monday - Friday. Please note that voicemails left after 4:00 p.m. may not be returned until the following business day.  We are closed weekends and major holidays. You have access to a nurse at all times for urgent questions. Please call the main number to the clinic Dept: [phone] and follow the prompts.  For any non-urgent questions, you may also contact your provider using MyChart. We now offer e-Visits for anyone 67 and older to request care online for non-urgent symptoms. For details visit [email or web address].   Also download the MyChart app! Go to the app store, search "MyChart", open the app, select [facility], and log in with your MyChart username and password.  Due to Covid, a mask is required upon entering the hospital/clinic. If you do not have a mask, one will be given to you upon arrival. For doctor visits, patients may have 1 support person aged 12 or older with them. For treatment visits, patients cannot have anyone with them due to current Covid guidelines and our immunocompromised population.

## 2020-12-03 NOTE — Progress Notes (Signed)
Discharged home, stable  

## 2020-12-05 ENCOUNTER — Telehealth: Payer: Self-pay

## 2020-12-05 NOTE — Telephone Encounter (Signed)
Post new treatment call: Patient reports fatigue and constipation. Advised to try Magnesium Citrate followed by Senna S twice a day.Ulice Dash Pharm D. Consulted for advice on constipation treatment.

## 2020-12-11 ENCOUNTER — Encounter: Payer: Self-pay | Admitting: Oncology

## 2020-12-12 ENCOUNTER — Other Ambulatory Visit: Payer: Self-pay

## 2020-12-12 ENCOUNTER — Inpatient Hospital Stay: Payer: Medicare PPO

## 2020-12-12 ENCOUNTER — Encounter: Payer: Self-pay | Admitting: Hematology and Oncology

## 2020-12-12 ENCOUNTER — Other Ambulatory Visit: Payer: Self-pay | Admitting: Hematology and Oncology

## 2020-12-12 ENCOUNTER — Inpatient Hospital Stay (INDEPENDENT_AMBULATORY_CARE_PROVIDER_SITE_OTHER): Payer: Medicare PPO | Admitting: Hematology and Oncology

## 2020-12-12 DIAGNOSIS — Z5112 Encounter for antineoplastic immunotherapy: Secondary | ICD-10-CM | POA: Diagnosis not present

## 2020-12-12 DIAGNOSIS — C8309 Small cell B-cell lymphoma, extranodal and solid organ sites: Secondary | ICD-10-CM

## 2020-12-12 DIAGNOSIS — N6019 Diffuse cystic mastopathy of unspecified breast: Secondary | ICD-10-CM

## 2020-12-12 LAB — CBC
MCV: 86 (ref 81–99)
RBC: 3.03 — AB (ref 3.87–5.11)

## 2020-12-12 LAB — HEPATITIS B CORE ANTIBODY, TOTAL: Hep B Core Total Ab: NONREACTIVE

## 2020-12-12 LAB — LACTATE DEHYDROGENASE: LDH: 177 U/L (ref 98–192)

## 2020-12-12 LAB — COMPREHENSIVE METABOLIC PANEL
Albumin: 3.8 (ref 3.5–5.0)
Calcium: 8.6 — AB (ref 8.7–10.7)

## 2020-12-12 LAB — BASIC METABOLIC PANEL
BUN: 16 (ref 4–21)
CO2: 29 — AB (ref 13–22)
Chloride: 109 — AB (ref 99–108)
Creatinine: 1.2 — AB (ref 0.5–1.1)
Glucose: 129
Potassium: 4 (ref 3.4–5.3)
Sodium: 142 (ref 137–147)

## 2020-12-12 LAB — HEPATITIS B SURFACE ANTIGEN: Hepatitis B Surface Ag: NONREACTIVE

## 2020-12-12 LAB — HEPATIC FUNCTION PANEL
ALT: 12 (ref 7–35)
AST: 28 (ref 13–35)
Alkaline Phosphatase: 61 (ref 25–125)
Bilirubin, Total: 0.8

## 2020-12-12 LAB — CBC AND DIFFERENTIAL
HCT: 26 — AB (ref 36–46)
Hemoglobin: 8.8 — AB (ref 12.0–16.0)
Neutrophils Absolute: 1.27
Platelets: 217 (ref 150–399)
WBC: 1.9

## 2020-12-12 NOTE — Progress Notes (Signed)
[facility]  [address] [place],  [place]  [address] [phone]  Clinic Day:  12/12/2020  Referring physician: Algis Greenhouse, MD  ASSESSMENT & PLAN:   Assessment & Plan: Non-Hodgkin lymphoma [facility]) She did well with her first cycle other than rigors with her rituxan. She states it resolved with demerol and she did well after. She had some fatigue and decreased appetite this past week, but feels much better today. She will obtain her flu shot and COVID booster if available.  She will return to clinic 10/28 for scheduled appointment with Dr. Bobby Rumpf prior to cycle 2 treatment.   Fibrocystic breast disease She is having a mammogram today.   The patient understands the plans discussed today and is in agreement with them.  She knows to contact our office if she develops concerns prior to her next appointment.    [name], NP  [place] [address] [place] [address] Dept: [phone] Dept Fax: [phone]   No orders of the defined types were placed in this encounter.     CHIEF COMPLAINT:  CC: A 75 year old female with history of Non-Hodgkin's Lymphoma here for nadir visit after her first cycle of chemotherapy  Current Treatment:  Rituximab/ Bendamustine   HISTORY OF PRESENT ILLNESS:   Oncology History  Non-Hodgkin lymphoma ([place])  11/17/2020 Initial Diagnosis   Non-Hodgkin lymphoma ([place])   12/02/2020 -  Chemotherapy   Patient is on Treatment Plan : NON-HODGKINS LYMPHOMA Rituximab D1 / Bendamustine D1,2 q28d        INTERVAL HISTORY:  Alisha Cochran is here today for nadir visit. She did well with her first treatment other than some rigors with Rituxan infusion. This resolved with demerol and she has been well this past week other than fatigue and decreased appetite. We discussed these are normal side effects of her treatment and how to better manage these at  home. She denies fever, chills, nausea or vomiting. She denies shortness of breath, chest pain or cough. She denies issue with bowel or bladder. CBC today reveals white count 1.9 with ANC 1.27, hemoglobin 8.8 and normal platelets. CMP is unremarkable.   REVIEW OF SYSTEMS:  Review of Systems  Constitutional:  Positive for appetite change and fatigue. Negative for chills, diaphoresis, fever and unexpected weight change.  HENT:   Negative for hearing loss, lump/mass, mouth sores, nosebleeds, sore throat, tinnitus, trouble swallowing and voice change.   Eyes:  Negative for eye problems and icterus.  Respiratory:  Negative for chest tightness, cough, hemoptysis, shortness of breath and wheezing.   Cardiovascular:  Negative for chest pain, leg swelling and palpitations.  Gastrointestinal:  Negative for abdominal distention, abdominal pain, blood in stool, constipation, diarrhea, nausea, rectal pain and vomiting.  Endocrine: Negative for hot flashes.  Genitourinary:  Negative for bladder incontinence, difficulty urinating, dyspareunia, dysuria, frequency, hematuria and nocturia.   Musculoskeletal:  Negative for arthralgias, back pain, flank pain, gait problem, myalgias, neck pain and neck stiffness.  Skin:  Negative for itching, rash and wound.  Neurological:  Negative for dizziness, extremity weakness, gait problem, headaches, light-headedness, numbness, seizures and speech difficulty.  Hematological:  Negative for adenopathy. Does not bruise/bleed easily.  Psychiatric/Behavioral:  Negative for confusion, decreased concentration, depression, sleep disturbance and suicidal ideas. The patient is not nervous/anxious.     VITALS:  Blood pressure (!) 133/43, pulse 60, temperature 97.9 F (36.6 C), temperature source Oral, resp. rate 18,  height 5\' 5"  (1.651 m), weight 195 lb (88.5 kg), SpO2 100 %.  Wt Readings from Last 3 Encounters:  12/12/20 195 lb (88.5 kg)  12/03/20 200 lb (90.7 kg)  12/02/20 198 lb  0.6 oz (89.8 kg)    Body mass index is 32.45 kg/m.  Performance status (ECOG): 1 - Symptomatic but completely ambulatory  PHYSICAL EXAM:  Physical Exam Constitutional:      General: She is not in acute distress.    Appearance: Normal appearance. She is normal weight. She is not ill-appearing, toxic-appearing or diaphoretic.  HENT:     Head: Normocephalic and atraumatic.     Nose: Nose normal. No congestion or rhinorrhea.     Mouth/Throat:     Mouth: Mucous membranes are moist.     Pharynx: Oropharynx is clear. No oropharyngeal exudate or posterior oropharyngeal erythema.  Eyes:     General: No scleral icterus.       Right eye: No discharge.        Left eye: No discharge.     Extraocular Movements: Extraocular movements intact.     Conjunctiva/sclera: Conjunctivae normal.     Pupils: Pupils are equal, round, and reactive to light.  Neck:     Vascular: No carotid bruit.  Cardiovascular:     Rate and Rhythm: Normal rate and regular rhythm.     Heart sounds: No murmur heard.   No friction rub. No gallop.  Pulmonary:     Effort: Pulmonary effort is normal. No respiratory distress.     Breath sounds: Normal breath sounds. No stridor. No wheezing, rhonchi or rales.  Chest:     Chest wall: No tenderness.  Abdominal:     General: Abdomen is flat. Bowel sounds are normal. There is no distension.     Palpations: There is no mass.     Tenderness: There is no abdominal tenderness. There is no right CVA tenderness, left CVA tenderness, guarding or rebound.     Hernia: No hernia is present.  Musculoskeletal:        General: No swelling, tenderness, deformity or signs of injury. Normal range of motion.     Cervical back: Normal range of motion and neck supple. No rigidity or tenderness.     Right lower leg: No edema.     Left lower leg: No edema.  Lymphadenopathy:     Cervical: No cervical adenopathy.  Skin:    General: Skin is warm and dry.     Capillary Refill: Capillary refill  takes less than 2 seconds.     Coloration: Skin is not jaundiced or pale.     Findings: No bruising, erythema, lesion or rash.  Neurological:     General: No focal deficit present.     Mental Status: She is alert and oriented to person, place, and time. Mental status is at baseline.     Cranial Nerves: No cranial nerve deficit.     Sensory: No sensory deficit.     Motor: No weakness.     Coordination: Coordination normal.     Gait: Gait normal.     Deep Tendon Reflexes: Reflexes normal.  Psychiatric:        Mood and Affect: Mood normal.        Behavior: Behavior normal.        Thought Content: Thought content normal.        Judgment: Judgment normal.    LABS:   CBC Latest Ref Rng & Units 12/12/2020 11/25/2020 11/07/2020  WBC - 1.9 8.7 7.6  Hemoglobin 12.0 - 16.0 8.8(A) 11.1(A) 10.7(A)  Hematocrit 36 - 46 26(A) 33(A) 31(A)  Platelets 150 - 399 217 215 220   CMP Latest Ref Rng & Units 12/12/2020 11/25/2020 11/06/2020  Glucose 65 - 99 mg/dL - - -  BUN 4 - 21 16 16 17   Creatinine 0.5 - 1.1 1.2(A) 1.0 1.1  Sodium 137 - 147 142 136(A) 139  Potassium 3.4 - 5.3 4.0 3.8 4.0  Chloride 99 - 108 109(A) 102 103  CO2 13 - 22 29(A) 26(A) 30(A)  Calcium 8.7 - 10.7 8.6(A) 9.6 9.3  Alkaline Phos 25 - 125 61 99 87  AST 13 - 35 28 29 26   ALT 7 - 35 12 19 16      No results found for: CEA1 / No results found for: CEA1 No results found for: PSA1 No results found for: VXB939 No results found for: QZE092  Lab Results  Component Value Date   TOTALPROTELP 8.6 (H) 11/06/2020   ALBUMINELP 3.6 10/06/2020   A1GS 0.3 10/06/2020   A2GS 0.8 10/06/2020   BETS 0.9 10/06/2020   GAMS 2.8 (H) 10/06/2020   MSPIKE 2.5 (H) 10/06/2020   SPEI Comment 10/06/2020   Lab Results  Component Value Date   TIBC 297 10/06/2020   FERRITIN 222 10/06/2020   IRONPCTSAT 28 10/06/2020   Lab Results  Component Value Date   LDH 131 11/25/2020    STUDIES:  No results found.    HISTORY:   Past Medical History:   Diagnosis Date   Acute pain of right knee 09/04/2019   Formatting of this note might be different from the original. 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 2014: MRI DDD L4/5   Diverticulosis of colon 09/15/2015   2014, 2020: diverticulitis   Dyspnea on exertion 10/25/2018   2020   Essential hypertension 05/08/2019   Family history of premature coronary artery disease 04/12/2018   Fibrocystic breast disease 09/15/2015   Herpes zoster without complication 33/00/7622   2020: left T12   Hyperlipemia, mixed 05/18/2015   Insomnia 05/21/2015   Lumbar radiculopathy 09/25/2012   Overview:  2008: onset 2009: MRI DDD with tear and protrusion L5-S1, facet arthropathy L5-S1 2014: MRI L4-5 DDD with progression of L5-S1 changes 2014: Pain eval, limited  Formatting of this note might be different from the original. 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 2008: -0.7 2016: -1.6 2019: -1.5  Formatting of this note might be different from the original. 2008: -0.7 2016: -1.6 2019: -1.5 2021: -1.6   Stenosis of left carotid artery 06/22/2019   Formatting of this note might be different from the original. 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   Wellness examination 09/15/2015    Past Surgical History:  Procedure Laterality Date   BREAST EXCISIONAL BIOPSY Right    x2   BREAST EXCISIONAL BIOPSY Left    x2   CHOLECYSTECTOMY     OVARIAN CYST SURGERY     ROTATOR CUFF REPAIR Right 2008   ROTATOR CUFF REPAIR Left 2006   TONSILLECTOMY     VAGINAL HYSTERECTOMY  1987    Family History  Problem Relation Age of Onset   Breast cancer Mother    Breast cancer Maternal Grandmother    Breast cancer Paternal Grandmother    Heart attack Paternal Grandmother    Heart disease Father    Heart attack Paternal Grandfather     Social History:  reports that she quit smoking about 42 years ago. Her smoking use included cigarettes. She started smoking about 56 years ago. She has never used smokeless tobacco. She reports current alcohol use of about 14.0 standard drinks per week. She reports that she does not use drugs.The patient is accompanied by husband today.  Allergies:  Allergies  Allergen Reactions   Lisinopril Cough    Current Medications: Current Outpatient Medications  Medication Sig Dispense Refill   calcium-vitamin D (OSCAL WITH D) 500-200 MG-UNIT TABS tablet Take 1 tablet by mouth daily.     doxazosin (CARDURA) 2 MG tablet Take 2 mg by mouth every evening.     furosemide (LASIX) 40 MG tablet TAKE 1 TABLET(40 MG) BY MOUTH DAILY 30 tablet 3   losartan (COZAAR) 100 MG tablet TAKE 1 TABLET(100 MG) BY MOUTH DAILY 90 tablet 2   metoprolol tartrate (LOPRESSOR) 50 MG tablet Take 50 mg by mouth every 12 (twelve) hours.     ondansetron  (ZOFRAN) 4 MG tablet Take 1 tablet (4 mg total) by mouth every 8 (eight) hours as needed for nausea or vomiting. 20 tablet 0   ondansetron (ZOFRAN) 4 MG tablet Take 1 tablet (4 mg total) by mouth every 4 (four) hours as needed for nausea. 90 tablet 3   pregabalin (LYRICA) 75 MG capsule Take 75 mg by mouth at bedtime.     prochlorperazine (COMPAZINE) 10 MG tablet Take 1 tablet (10 mg total) by mouth every 6 (six) hours as needed for nausea or vomiting. 90 tablet 3   rosuvastatin (CRESTOR) 40 MG tablet Take 40 mg by mouth daily.     zolpidem (AMBIEN) 10 MG tablet TAKE 1/2 TABLET(5 MG) BY MOUTH EVERY NIGHT AS NEEDED FOR SLEEP     No current facility-administered medications for this visit.

## 2020-12-12 NOTE — Assessment & Plan Note (Signed)
She is having a mammogram today.

## 2020-12-12 NOTE — Assessment & Plan Note (Addendum)
She did well with her first cycle other than rigors with her rituxan. She states it resolved with demerol and she did well after. She had some fatigue and decreased appetite this past week, but feels much better today. She will obtain her flu shot and COVID booster if available.  She will return to clinic 10/28 for scheduled appointment with Dr. Bobby Rumpf prior to cycle 2 treatment.

## 2020-12-15 LAB — MULTIPLE MYELOMA PANEL, SERUM
Albumin SerPl Elph-Mcnc: 3.2 g/dL (ref 2.9–4.4)
Albumin/Glob SerPl: 1 (ref 0.7–1.7)
Alpha 1: 0.3 g/dL (ref 0.0–0.4)
Alpha2 Glob SerPl Elph-Mcnc: 0.6 g/dL (ref 0.4–1.0)
B-Globulin SerPl Elph-Mcnc: 0.8 g/dL (ref 0.7–1.3)
Gamma Glob SerPl Elph-Mcnc: 1.8 g/dL (ref 0.4–1.8)
Globulin, Total: 3.5 g/dL (ref 2.2–3.9)
IgA: 11 mg/dL — ABNORMAL LOW (ref 64–422)
IgG (Immunoglobin G), Serum: 1827 mg/dL — ABNORMAL HIGH (ref 586–1602)
IgM (Immunoglobulin M), Srm: 10 mg/dL — ABNORMAL LOW (ref 26–217)
M Protein SerPl Elph-Mcnc: 1.7 g/dL — ABNORMAL HIGH
Total Protein ELP: 6.7 g/dL (ref 6.0–8.5)

## 2020-12-16 LAB — KAPPA/LAMBDA LIGHT CHAINS
Kappa free light chain: 1029 mg/L — ABNORMAL HIGH (ref 3.3–19.4)
Kappa, lambda light chain ratio: 107.19 — ABNORMAL HIGH (ref 0.26–1.65)
Lambda free light chains: 9.6 mg/L (ref 5.7–26.3)

## 2020-12-22 NOTE — Progress Notes (Signed)
Alisha Cochran  [address] [place],  [place]  [address] [phone]  Clinic Day:  12/26/2020  Referring physician: Algis Greenhouse, MD  This document serves as a record of services personally performed by Dequincy Macarthur Critchley, MD. It was created on their behalf by [facility], a trained medical scribe. The creation of this record is based on the scribe's personal observations and the provider's statements to them.  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  She comes in today prior to her 2nd cycle of Bendamustine/Rituxan therapy.  She tolerated her 1st cycle fine.  She still has increased fatigue, but denies having any overt forms of blood loss or any B symptoms.  PHYSICAL EXAM:  Blood pressure (!) 159/69, pulse 82, temperature 98.5 F (36.9 C), resp. rate 16, height 5\' 5"  (1.651 m), weight 192 lb 12.8 oz (87.5 kg), SpO2 98 %. Wt Readings from Last 3 Encounters:  12/26/20 192 lb 12.8 oz (87.5 kg)  12/12/20 195 lb (88.5 kg)  12/03/20 200 lb (90.7 kg)   Body mass index is 32.08 kg/m. Performance status (ECOG): 1 - Symptomatic but completely ambulatory Physical Exam Constitutional:      Appearance: Normal appearance. She is not ill-appearing.  HENT:     Mouth/Throat:     Mouth: Mucous membranes are moist.     Pharynx: Oropharynx is clear. No oropharyngeal exudate or posterior oropharyngeal erythema.  Cardiovascular:     Rate and Rhythm: Normal rate and regular rhythm.     Heart sounds: No murmur heard.   No friction rub. No gallop.  Pulmonary:     Effort: Pulmonary effort is normal. No respiratory distress.     Breath sounds: Normal breath sounds. No wheezing, rhonchi or rales.  Abdominal:     General: Bowel sounds are normal. There is no distension.     Palpations: Abdomen is soft. There is no mass.     Tenderness: There is no abdominal tenderness.  Musculoskeletal:        General: No  swelling.     Right lower leg: No edema.     Left lower leg: No edema.  Lymphadenopathy:     Cervical: No cervical adenopathy.     Upper Body:     Right upper body: No supraclavicular or axillary adenopathy.     Left upper body: No supraclavicular or axillary adenopathy.     Lower Body: No right inguinal adenopathy. No left inguinal adenopathy.  Skin:    General: Skin is warm.     Coloration: Skin is not jaundiced.     Findings: No lesion or rash.  Neurological:     General: No focal deficit present.     Mental Status: She is alert and oriented to person, place, and time. Mental status is at baseline.  Psychiatric:        Mood and Affect: Mood normal.        Behavior: Behavior normal.        Thought Content: Thought content normal.   LABS:  Ref. Range 12/26/2020 00:00  Sodium Latest Ref Range: 137 - 147  140  Potassium Latest Ref Range: 3.4 - 5.3  4.0  Chloride Latest Ref Range: 99 - 108  108  CO2 Latest Ref Range: 13 - 22  25 (A)  Glucose Unknown 110  BUN Latest Ref Range: 4 - 21  11  Creatinine Latest Ref Range: 0.5 -  1.1  0.9  Calcium Latest Ref Range: 8.7 - 10.7  8.4 (A)  Alkaline Phosphatase Latest Ref Range: 25 - 125  79  Albumin Latest Ref Range: 3.5 - 5.0  3.9  AST Latest Ref Range: 13 - 35  33  ALT Latest Ref Range: 7 - 35  20  Bilirubin, Total Unknown 1.5  WBC Unknown 2.4  RBC Latest Ref Range: 3.87 - 5.11  3.2 (A)  Hemoglobin Latest Ref Range: 12.0 - 16.0  9.0 (A)  HCT Latest Ref Range: 36 - 46  27 (A)  Platelets Latest Ref Range: 150 - 399  132 (A)   Results for Alisha, Cochran (MRN [id number]) as of 12/28/2020 09:14  Ref. Range 11/06/2020 12:59 11/06/2020 14:35 11/07/2020 00:00 11/07/2020 00:00 11/24/2020 00:00 11/25/2020 00:00 11/25/2020 15:19 11/28/2020 13:19 12/12/2020 00:00 12/12/2020 10:21 12/12/2020 10:29  IgG (Immunoglobin G), Serum Latest Ref Range: 586 - 1,602 mg/dL 3,248 (H)          1,827 (H)  IgM (Immunoglobulin M), Srm Latest Ref Range: 26 - 217 mg/dL  13 (L)          10 (L)  IgA Latest Ref Range: 64 - 422 mg/dL 14 (L)          11 (L)  Kappa free light chain Latest Ref Range: 3.3 - 19.4 mg/L 1,413.1 (H)          1,029.0 (H)  Lambda free light chains Latest Ref Range: 5.7 - 26.3 mg/L 11.0          9.6  Kappa, lambda light chain ratio Latest Ref Range: 0.26 - 1.65  128.46 (H)          107.19 (H)    Ref. 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  After just 1 cycle of treatment, all of her monoclonal parameters have significantly falling, thus reflecting a positive response to therapy.  She will proceed with her 2nd cycle of Bendamustine/Rituxan next week.   As some of her peripheral counts are low, I will arrange for her to receive Neulasta with this cycle of treatment to prevent severe neutropenia, Retacrit 40,000 units will also be given to hasten her chemotherapy-induced neutropenia.   Otherwise, I will see her back in 4 weeks before she heads into her 3rd cycle of Bendamustine/Rituxan.  The patient understands all the plans discussed today and is in agreement with them.     I, Rita Ohara, am acting as scribe for [name], MD    I have reviewed this report as typed by the medical scribe, and it is complete and accurate.  Dequincy Macarthur Critchley, MD

## 2020-12-26 ENCOUNTER — Inpatient Hospital Stay: Payer: Medicare PPO

## 2020-12-26 ENCOUNTER — Inpatient Hospital Stay: Payer: Medicare PPO | Admitting: Oncology

## 2020-12-26 ENCOUNTER — Other Ambulatory Visit: Payer: Self-pay | Admitting: Pharmacist

## 2020-12-26 ENCOUNTER — Other Ambulatory Visit: Payer: Medicare PPO

## 2020-12-26 ENCOUNTER — Telehealth: Payer: Self-pay | Admitting: Oncology

## 2020-12-26 ENCOUNTER — Other Ambulatory Visit: Payer: Self-pay | Admitting: Hematology and Oncology

## 2020-12-26 DIAGNOSIS — C8309 Small cell B-cell lymphoma, extranodal and solid organ sites: Secondary | ICD-10-CM

## 2020-12-26 DIAGNOSIS — T451X5A Adverse effect of antineoplastic and immunosuppressive drugs, initial encounter: Secondary | ICD-10-CM

## 2020-12-26 DIAGNOSIS — D6481 Anemia due to antineoplastic chemotherapy: Secondary | ICD-10-CM

## 2020-12-26 HISTORY — DX: Anemia due to antineoplastic chemotherapy: D64.81

## 2020-12-26 LAB — BASIC METABOLIC PANEL
BUN: 11 (ref 4–21)
CO2: 25 — AB (ref 13–22)
Chloride: 108 (ref 99–108)
Creatinine: 0.9 (ref 0.5–1.1)
Glucose: 110
Potassium: 4 (ref 3.4–5.3)
Sodium: 140 (ref 137–147)

## 2020-12-26 LAB — CBC AND DIFFERENTIAL
HCT: 27 — AB (ref 36–46)
Hemoglobin: 9 — AB (ref 12.0–16.0)
Neutrophils Absolute: 1.44
Platelets: 132 — AB (ref 150–399)
WBC: 2.4

## 2020-12-26 LAB — CBC: RBC: 3.2 — AB (ref 3.87–5.11)

## 2020-12-26 LAB — HEPATIC FUNCTION PANEL
ALT: 20 (ref 7–35)
AST: 33 (ref 13–35)
Alkaline Phosphatase: 79 (ref 25–125)
Bilirubin, Total: 1.5

## 2020-12-26 LAB — COMPREHENSIVE METABOLIC PANEL
Albumin: 3.9 (ref 3.5–5.0)
Calcium: 8.4 — AB (ref 8.7–10.7)

## 2020-12-26 NOTE — Telephone Encounter (Signed)
Per 10/28 LOS, scheduled patient's next two Labs, Follow Up's, Infusions, Injection Appt's.  Patient will get print out of Appt's at next Visit

## 2020-12-28 ENCOUNTER — Other Ambulatory Visit: Payer: Self-pay | Admitting: Oncology

## 2020-12-28 ENCOUNTER — Encounter: Payer: Self-pay | Admitting: Oncology

## 2020-12-28 DIAGNOSIS — C8309 Small cell B-cell lymphoma, extranodal and solid organ sites: Secondary | ICD-10-CM

## 2020-12-29 ENCOUNTER — Other Ambulatory Visit: Payer: Self-pay | Admitting: Hematology and Oncology

## 2020-12-29 MED FILL — Dexamethasone Sodium Phosphate Inj 100 MG/10ML: INTRAMUSCULAR | Qty: 1 | Status: AC

## 2020-12-29 MED FILL — Rituximab-pvvr IV Soln 500 MG/50ML (10 MG/ML): INTRAVENOUS | Qty: 80 | Status: AC

## 2020-12-29 MED FILL — Bendamustine HCl IV Soln 100 MG/4ML (25 MG/ML): INTRAVENOUS | Qty: 7 | Status: AC

## 2020-12-30 ENCOUNTER — Other Ambulatory Visit: Payer: Self-pay

## 2020-12-30 ENCOUNTER — Ambulatory Visit: Payer: Medicare PPO

## 2020-12-30 ENCOUNTER — Inpatient Hospital Stay: Payer: Medicare PPO | Attending: Oncology

## 2020-12-30 VITALS — BP 137/51 | HR 69 | Temp 98.2°F | Resp 20 | Ht 65.0 in | Wt 191.0 lb

## 2020-12-30 DIAGNOSIS — D6481 Anemia due to antineoplastic chemotherapy: Secondary | ICD-10-CM | POA: Diagnosis not present

## 2020-12-30 DIAGNOSIS — Z5111 Encounter for antineoplastic chemotherapy: Secondary | ICD-10-CM | POA: Diagnosis present

## 2020-12-30 DIAGNOSIS — Z5112 Encounter for antineoplastic immunotherapy: Secondary | ICD-10-CM | POA: Insufficient documentation

## 2020-12-30 DIAGNOSIS — C8309 Small cell B-cell lymphoma, extranodal and solid organ sites: Secondary | ICD-10-CM

## 2020-12-30 DIAGNOSIS — Z5189 Encounter for other specified aftercare: Secondary | ICD-10-CM | POA: Insufficient documentation

## 2020-12-30 DIAGNOSIS — C8590 Non-Hodgkin lymphoma, unspecified, unspecified site: Secondary | ICD-10-CM | POA: Diagnosis present

## 2020-12-30 MED ORDER — DIPHENHYDRAMINE HCL 25 MG PO CAPS
50.0000 mg | ORAL_CAPSULE | Freq: Once | ORAL | Status: AC
  Administered 2020-12-30: 50 mg via ORAL
  Filled 2020-12-30: qty 2

## 2020-12-30 MED ORDER — SODIUM CHLORIDE 0.9% FLUSH
10.0000 mL | INTRAVENOUS | Status: DC | PRN
Start: 2020-12-30 — End: 2020-12-30

## 2020-12-30 MED ORDER — HEPARIN SOD (PORK) LOCK FLUSH 100 UNIT/ML IV SOLN
500.0000 [IU] | Freq: Once | INTRAVENOUS | Status: AC | PRN
  Administered 2020-12-30: 500 [IU]

## 2020-12-30 MED ORDER — SODIUM CHLORIDE 0.9 % IV SOLN
375.0000 mg/m2 | Freq: Once | INTRAVENOUS | Status: AC
  Administered 2020-12-30: 800 mg via INTRAVENOUS
  Filled 2020-12-30: qty 50

## 2020-12-30 MED ORDER — SODIUM CHLORIDE 0.9 % IV SOLN
Freq: Once | INTRAVENOUS | Status: AC
Start: 2020-12-30 — End: 2020-12-30

## 2020-12-30 MED ORDER — SODIUM CHLORIDE 0.9 % IV SOLN
90.0000 mg/m2 | Freq: Once | INTRAVENOUS | Status: AC
  Administered 2020-12-30: 175 mg via INTRAVENOUS
  Filled 2020-12-30: qty 7

## 2020-12-30 MED ORDER — ACETAMINOPHEN 325 MG PO TABS
650.0000 mg | ORAL_TABLET | Freq: Once | ORAL | Status: AC
  Administered 2020-12-30: 650 mg via ORAL
  Filled 2020-12-30: qty 2

## 2020-12-30 MED ORDER — DEXAMETHASONE SODIUM PHOSPHATE 100 MG/10ML IJ SOLN
10.0000 mg | Freq: Once | INTRAMUSCULAR | Status: AC
  Administered 2020-12-30: 10 mg via INTRAVENOUS
  Filled 2020-12-30: qty 10

## 2020-12-30 MED ORDER — PALONOSETRON HCL INJECTION 0.25 MG/5ML
0.2500 mg | Freq: Once | INTRAVENOUS | Status: AC
  Administered 2020-12-30: 0.25 mg via INTRAVENOUS
  Filled 2020-12-30: qty 5

## 2020-12-30 MED FILL — Dexamethasone Sodium Phosphate Inj 100 MG/10ML: INTRAMUSCULAR | Qty: 1 | Status: AC

## 2020-12-30 MED FILL — Bendamustine HCl IV Soln 100 MG/4ML (25 MG/ML): INTRAVENOUS | Qty: 7 | Status: AC

## 2020-12-30 NOTE — Progress Notes (Signed)
Discharged home stable. Port accessed, dressing intact.

## 2020-12-30 NOTE — Patient Instructions (Signed)
[facility]  Discharge Instructions: Thank you for choosing [facility] to provide your oncology and hematology care.  If you have a lab appointment with the [facility], please go directly to the [facility] and check in at the registration area.   Wear comfortable clothing and clothing appropriate for easy access to any Portacath or PICC line.   We strive to give you quality time with your provider. You may need to reschedule your appointment if you arrive late (15 or more minutes).  Arriving late affects you and other patients whose appointments are after yours.  Also, if you miss three or more appointments without notifying the office, you may be dismissed from the clinic at the provider's discretion.      For prescription refill requests, have your pharmacy contact our office and allow 72 hours for refills to be completed.    Today you received the following chemotherapy and/or immunotherapy agents:Bendamustine and Ritux   To help prevent nausea and vomiting after your treatment, we encourage you to take your nausea medication as directed.  BELOW ARE SYMPTOMS THAT SHOULD BE REPORTED IMMEDIATELY: *FEVER GREATER THAN 100.4 F (38 C) OR HIGHER *CHILLS OR SWEATING *NAUSEA AND VOMITING THAT IS NOT CONTROLLED WITH YOUR NAUSEA MEDICATION *UNUSUAL SHORTNESS OF BREATH *UNUSUAL BRUISING OR BLEEDING *URINARY PROBLEMS (pain or burning when urinating, or frequent urination) *BOWEL PROBLEMS (unusual diarrhea, constipation, pain near the anus) TENDERNESS IN MOUTH AND THROAT WITH OR WITHOUT PRESENCE OF ULCERS (sore throat, sores in mouth, or a toothache) UNUSUAL RASH, SWELLING OR PAIN  UNUSUAL VAGINAL DISCHARGE OR ITCHING   Items with * indicate a potential emergency and should be followed up as soon as possible or go to the Emergency Department if any problems should occur.  Please show the CHEMOTHERAPY ALERT CARD or IMMUNOTHERAPY ALERT CARD at check-in  to the Emergency Department and triage nurse.  Should you have questions after your visit or need to cancel or reschedule your appointment, please contact [facility]  Dept: [phone]  and follow the prompts.  Office hours are 8:00 a.m. to 4:30 p.m. Monday - Friday. Please note that voicemails left after 4:00 p.m. may not be returned until the following business day.  We are closed weekends and major holidays. You have access to a nurse at all times for urgent questions. Please call the main number to the clinic Dept: [phone] and follow the prompts.  For any non-urgent questions, you may also contact your provider using MyChart. We now offer e-Visits for anyone 49 and older to request care online for non-urgent symptoms. For details visit [email or web address].   Also download the MyChart app! Go to the app store, search "MyChart", open the app, select [facility], and log in with your MyChart username and password.  Due to Covid, a mask is required upon entering the hospital/clinic. If you do not have a mask, one will be given to you upon arrival. For doctor visits, patients may have 1 support person aged 65 or older with them. For treatment visits, patients cannot have anyone with them due to current Covid guidelines and our immunocompromised population.   Rituximab Injection What is this medication? RITUXIMAB (ri TUX i mab) is a monoclonal antibody. It is used to treat certain types of cancer like non-Hodgkin lymphoma and chronic lymphocytic leukemia. It is also used to treat rheumatoid arthritis, granulomatosis with polyangiitis, microscopic polyangiitis, and pemphigus vulgaris. This medicine may be used for  other purposes; ask your health care provider or pharmacist if you have questions. COMMON BRAND NAME(S): RIABNI, Rituxan, RUXIENCE What should I tell my care team before I take this medication? They need to know if you have any of these conditions: chest  pain heart disease infection especially a viral infection such as chickenpox, cold sores, hepatitis B, or herpes immune system problems irregular heartbeat or rhythm kidney disease low blood counts (white cells, platelets, or red cells) lung disease recent or upcoming vaccine an unusual or allergic reaction to rituximab, other medicines, foods, dyes, or preservatives pregnant or trying to get pregnant breast-feeding How should I use this medication? This medicine is injected into a vein. It is given by a health care provider in a hospital or clinic setting. A special MedGuide will be given to you before each treatment. Be sure to read this information carefully each time. Talk to your health care provider about the use of this medicine in children. While this drug may be prescribed for children as young as 6 months for selected conditions, precautions do apply. Overdosage: If you think you have taken too much of this medicine contact a poison control center or emergency room at once. NOTE: This medicine is only for you. Do not share this medicine with others. What if I miss a dose? Keep appointments for follow-up doses. It is important not to miss your dose. Call your health care provider if you are unable to keep an appointment. What may interact with this medication? Do not take this medicine with any of the following medicines: live vaccines This medicine may also interact with the following medicines: cisplatin This list may not describe all possible interactions. Give your health care provider a list of all the medicines, herbs, non-prescription drugs, or dietary supplements you use. Also tell them if you smoke, drink alcohol, or use illegal drugs. Some items may interact with your medicine. What should I watch for while using this medication? Your condition will be monitored carefully while you are receiving this medicine. You may need blood work done while you are taking this  medicine. This medicine can cause serious infusion reactions. To reduce the risk your health care provider may give you other medicines to take before receiving this one. Be sure to follow the directions from your health care provider. This medicine may increase your risk of getting an infection. Call your health care provider for advice if you get a fever, chills, sore throat, or other symptoms of a cold or flu. Do not treat yourself. Try to avoid being around people who are sick. Call your health care provider if you are around anyone with measles, chickenpox, or if you develop sores or blisters that do not heal properly. Avoid taking medicines that contain aspirin, acetaminophen, ibuprofen, naproxen, or ketoprofen unless instructed by your health care provider. These medicines may hide a fever. This medicine may cause serious skin reactions. They can happen weeks to months after starting the medicine. Contact your health care provider right away if you notice fevers or flu-like symptoms with a rash. The rash may be red or purple and then turn into blisters or peeling of the skin. Or, you might notice a red rash with swelling of the face, lips or lymph nodes in your neck or under your arms. In some patients, this medicine may cause a serious brain infection that may cause death. If you have any problems seeing, thinking, speaking, walking, or standing, tell your healthcare professional right away.  If you cannot reach your healthcare professional, urgently seek other source of medical care. Do not become pregnant while taking this medicine or for at least 12 months after stopping it. Women should inform their health care provider if they wish to become pregnant or think they might be pregnant. There is potential for serious harm to an unborn child. Talk to your health care provider for more information. Women should use a reliable form of birth control while taking this medicine and for 12 months after  stopping it. Do not breast-feed while taking this medicine or for at least 6 months after stopping it. What side effects may I notice from receiving this medication? Side effects that you should report to your health care provider as soon as possible: allergic reactions (skin rash, itching or hives; swelling of the face, lips, or tongue) diarrhea edema (sudden weight gain; swelling of the ankles, feet, hands or other unusual swelling; trouble breathing) fast, irregular heartbeat heart attack (trouble breathing; pain or tightness in the chest, neck, back or arms; unusually weak or tired) infection (fever, chills, cough, sore throat, pain or trouble passing urine) kidney injury (trouble passing urine or change in the amount of urine) liver injury (dark yellow or brown urine; general ill feeling or flu-like symptoms; loss of appetite, right upper belly pain; unusually weak or tired, yellowing of the eyes or skin) low blood pressure (dizziness; feeling faint or lightheaded, falls; unusually weak or tired) low red blood cell counts (trouble breathing; feeling faint; lightheaded, falls; unusually weak or tired) mouth sores redness, blistering, peeling, or loosening of the skin, including inside the mouth stomach pain unusual bruising or bleeding wheezing (trouble breathing with loud or whistling sounds) vomiting Side effects that usually do not require medical attention (report to your health care provider if they continue or are bothersome): headache joint pain muscle cramps, pain nausea This list may not describe all possible side effects. Call your doctor for medical advice about side effects. You may report side effects to FDA at 1-800-FDA-1088. Where should I keep my medication? This medicine is given in a hospital or clinic. It will not be stored at home. NOTE: This sheet is a summary. It may not cover all possible information. If you have questions about this medicine, talk to your  doctor, pharmacist, or health care provider.  2022 [employer]/Gold Standard (2020-02-07 15:47:26) Bendamustine Injection What is this medication? BENDAMUSTINE (BEN da MUS teen) is a chemotherapy drug. It is used to treat chronic lymphocytic leukemia and non-Hodgkin lymphoma. This medicine may be used for other purposes; ask your health care provider or pharmacist if you have questions. COMMON BRAND NAME(S): Kristine Royal, Treanda What should I tell my care team before I take this medication? They need to know if you have any of these conditions: infection (especially a virus infection such as chickenpox, cold sores, or herpes) kidney disease liver disease an unusual or allergic reaction to bendamustine, mannitol, other medicines, foods, dyes, or preservatives pregnant or trying to get pregnant breast-feeding How should I use this medication? This medicine is for infusion into a vein. It is given by a health care professional in a hospital or clinic setting. Talk to your pediatrician regarding the use of this medicine in children. Special care may be needed. Overdosage: If you think you have taken too much of this medicine contact a poison control center or emergency room at once. NOTE: This medicine is only for you. Do not share this medicine with others. What if  I miss a dose? It is important not to miss your dose. Call your doctor or health care professional if you are unable to keep an appointment. What may interact with this medication? Do not take this medicine with any of the following medications: clozapine This medicine may also interact with the following medications: atazanavir cimetidine ciprofloxacin enoxacin fluvoxamine medicines for seizures like carbamazepine and phenobarbital mexiletine rifampin tacrine thiabendazole zileuton This list may not describe all possible interactions. Give your health care provider a list of all the medicines, herbs, non-prescription  drugs, or dietary supplements you use. Also tell them if you smoke, drink alcohol, or use illegal drugs. Some items may interact with your medicine. What should I watch for while using this medication? This drug may make you feel generally unwell. This is not uncommon, as chemotherapy can affect healthy cells as well as cancer cells. Report any side effects. Continue your course of treatment even though you feel ill unless your doctor tells you to stop. You may need blood work done while you are taking this medicine. Call your doctor or healthcare provider for advice if you get a fever, chills or sore throat, or other symptoms of a cold or flu. Do not treat yourself. This drug decreases your body's ability to fight infections. Try to avoid being around people who are sick. This medicine may cause serious skin reactions. They can happen weeks to months after starting the medicine. Contact your healthcare provider right away if you notice fevers or flu-like symptoms with a rash. The rash may be red or purple and then turn into blisters or peeling of the skin. Or, you might notice a red rash with swelling of the face, lips or lymph nodes in your neck or under your arms. In some patients, this medicine may cause a serious brain infection that may cause death. If you have any problems seeing, thinking, speaking, walking, or standing, tell your health care provider right away. If you cannot reach your health care provider, urgently seek other source of medical care. This medicine may increase your risk to bruise or bleed. Call your doctor or healthcare provider if you notice any unusual bleeding. Talk to your doctor about your risk of cancer. You may be more at risk for certain types of cancers if you take this medicine. This medicine may increase your risk of skin cancer. Check your skin for changes to moles or for new growths while taking this medicine. Call your health care provider if you notice any of these  skin changes. Do not become pregnant while taking this medicine or for at least 6 months after stopping it. Women should inform their doctor if they wish to become pregnant or think they might be pregnant. Men should not father a child while taking this medicine and for at least 3 months after stopping it. There is a potential for serious side effects to an unborn child. Talk to your healthcare provider or pharmacist for more information. Do not breast-feed an infant while taking this medicine or for at least 1 week after stopping it. This medicine may make it more difficult to father a child. You should talk with your doctor or healthcare provider if you are concerned about your fertility. What side effects may I notice from receiving this medication? Side effects that you should report to your doctor or health care professional as soon as possible: allergic reactions like skin rash, itching or hives, swelling of the face, lips, or tongue low blood  counts - this medicine may decrease the number of white blood cells, red blood cells and platelets. You may be at increased risk for infections and bleeding. rash, fever, and swollen lymph nodes redness, blistering, peeling, or loosening of the skin, including inside the mouth signs of infection like fever or chills, cough, sore throat, pain or difficulty passing urine signs of decreased platelets or bleeding like bruising, pinpoint red spots on the skin, black, tarry stools, blood in the urine signs of decreased red blood cells like being unusually weak or tired, fainting spells, lightheadedness signs and symptoms of kidney injury like trouble passing urine or change in the amount of urine signs and symptoms of liver injury like dark yellow or brown urine; general ill feeling or flu-like symptoms; light-colored stools; loss of appetite; nausea; right upper belly pain; unusually weak or tired; yellowing of the eyes or skin Side effects that usually do not  require medical attention (report to your doctor or health care professional if they continue or are bothersome): constipation decreased appetite diarrhea headache mouth sores nausea, vomiting tiredness This list may not describe all possible side effects. Call your doctor for medical advice about side effects. You may report side effects to FDA at 1-800-FDA-1088. Where should I keep my medication? This drug is given in a hospital or clinic and will not be stored at home. NOTE: This sheet is a summary. It may not cover all possible information. If you have questions about this medicine, talk to your doctor, pharmacist, or health care provider.  2022 [employer]/Gold Standard (2019-08-13 12:11:43)

## 2020-12-31 ENCOUNTER — Inpatient Hospital Stay: Payer: Medicare PPO

## 2020-12-31 VITALS — BP 137/63 | HR 57 | Temp 97.8°F | Resp 20

## 2020-12-31 DIAGNOSIS — D6481 Anemia due to antineoplastic chemotherapy: Secondary | ICD-10-CM

## 2020-12-31 DIAGNOSIS — Z5112 Encounter for antineoplastic immunotherapy: Secondary | ICD-10-CM | POA: Diagnosis not present

## 2020-12-31 DIAGNOSIS — D508 Other iron deficiency anemias: Secondary | ICD-10-CM

## 2020-12-31 DIAGNOSIS — T451X5A Adverse effect of antineoplastic and immunosuppressive drugs, initial encounter: Secondary | ICD-10-CM

## 2020-12-31 DIAGNOSIS — C8309 Small cell B-cell lymphoma, extranodal and solid organ sites: Secondary | ICD-10-CM

## 2020-12-31 MED ORDER — SODIUM CHLORIDE 0.9 % IV SOLN
90.0000 mg/m2 | Freq: Once | INTRAVENOUS | Status: AC
  Administered 2020-12-31: 175 mg via INTRAVENOUS
  Filled 2020-12-31: qty 7

## 2020-12-31 MED ORDER — SODIUM CHLORIDE 0.9% FLUSH
10.0000 mL | INTRAVENOUS | Status: DC | PRN
  Administered 2020-12-31: 10 mL

## 2020-12-31 MED ORDER — SODIUM CHLORIDE 0.9 % IV SOLN
10.0000 mg | Freq: Once | INTRAVENOUS | Status: AC
  Administered 2020-12-31: 10 mg via INTRAVENOUS
  Filled 2020-12-31: qty 10

## 2020-12-31 MED ORDER — SODIUM CHLORIDE 0.9 % IV SOLN
Freq: Once | INTRAVENOUS | Status: AC
Start: 2020-12-31 — End: 2020-12-31

## 2020-12-31 MED ORDER — EPOETIN ALFA-EPBX 40000 UNIT/ML IJ SOLN
40000.0000 [IU] | Freq: Once | INTRAMUSCULAR | Status: AC
  Administered 2020-12-31: 40000 [IU] via SUBCUTANEOUS
  Filled 2020-12-31: qty 1

## 2020-12-31 MED ORDER — HEPARIN SOD (PORK) LOCK FLUSH 100 UNIT/ML IV SOLN
500.0000 [IU] | Freq: Once | INTRAVENOUS | Status: AC | PRN
  Administered 2020-12-31: 500 [IU]

## 2020-12-31 NOTE — Patient Instructions (Addendum)
Epoetin Alfa injection What is this medication? EPOETIN ALFA (e POE e tin AL fa) helps your body make more red blood cells. This medicine is used to treat anemia caused by chronic kidney disease, cancer chemotherapy, or HIV-therapy. It may also be used before surgery if you have anemia. This medicine may be used for other purposes; ask your health care provider or pharmacist if you have questions. COMMON BRAND NAME(S): Epogen, Procrit, Retacrit What should I tell my care team before I take this medication? They need to know if you have any of these conditions: cancer heart disease high blood pressure history of blood clots history of stroke low levels of folate, iron, or vitamin B12 in the blood seizures an unusual or allergic reaction to erythropoietin, albumin, benzyl alcohol, hamster proteins, other medicines, foods, dyes, or preservatives pregnant or trying to get pregnant breast-feeding How should I use this medication? This medicine is for injection into a vein or under the skin. It is usually given by a health care professional in a hospital or clinic setting. If you get this medicine at home, you will be taught how to prepare and give this medicine. Use exactly as directed. Take your medicine at regular intervals. Do not take your medicine more often than directed. It is important that you put your used needles and syringes in a special sharps container. Do not put them in a trash can. If you do not have a sharps container, call your pharmacist or healthcare provider to get one. A special MedGuide will be given to you by the pharmacist with each prescription and refill. Be sure to read this information carefully each time. Talk to your pediatrician regarding the use of this medicine in children. While this drug may be prescribed for selected conditions, precautions do apply. Overdosage: If you think you have taken too much of this medicine contact a poison control center or emergency  room at once. NOTE: This medicine is only for you. Do not share this medicine with others. What if I miss a dose? If you miss a dose, take it as soon as you can. If it is almost time for your next dose, take only that dose. Do not take double or extra doses. What may interact with this medication? Interactions have not been studied. This list may not describe all possible interactions. Give your health care provider a list of all the medicines, herbs, non-prescription drugs, or dietary supplements you use. Also tell them if you smoke, drink alcohol, or use illegal drugs. Some items may interact with your medicine. What should I watch for while using this medication? Your condition will be monitored carefully while you are receiving this medicine. You may need blood work done while you are taking this medicine. This medicine may cause a decrease in vitamin B6. You should make sure that you get enough vitamin B6 while you are taking this medicine. Discuss the foods you eat and the vitamins you take with your health care professional. What side effects may I notice from receiving this medication? Side effects that you should report to your doctor or health care professional as soon as possible: allergic reactions like skin rash, itching or hives, swelling of the face, lips, or tongue seizures signs and symptoms of a blood clot such as breathing problems; changes in vision; chest pain; severe, sudden headache; pain, swelling, warmth in the leg; trouble speaking; sudden numbness or weakness of the face, arm or leg signs and symptoms of a stroke like   changes in vision; confusion; trouble speaking or understanding; severe headaches; sudden numbness or weakness of the face, arm or leg; trouble walking; dizziness; loss of balance or coordination Side effects that usually do not require medical attention (report to your doctor or health care professional if they continue or are  bothersome): chills cough dizziness fever headaches joint pain muscle cramps muscle pain nausea, vomiting pain, redness, or irritation at site where injected This list may not describe all possible side effects. Call your doctor for medical advice about side effects. You may report side effects to FDA at 1-800-FDA-1088. Where should I keep my medication? Keep out of the reach of children. Store in a refrigerator between 2 and 8 degrees C (36 and 46 degrees F). Do not freeze or shake. Throw away any unused portion if using a single-dose vial. Multi-dose vials can be kept in the refrigerator for up to 21 days after the initial dose. Throw away unused medicine. NOTE: This sheet is a summary. It may not cover all possible information. If you have questions about this medicine, talk to your doctor, pharmacist, or health care provider.  2022 [employer] Standard (2016-09-24 08:35:19) Bendamustine Injection What is this medication? BENDAMUSTINE (BEN da MUS teen) is a chemotherapy drug. It is used to treat chronic lymphocytic leukemia and non-Hodgkin lymphoma. This medicine may be used for other purposes; ask your health care provider or pharmacist if you have questions. COMMON BRAND NAME(S): Kristine Royal, Treanda What should I tell my care team before I take this medication? They need to know if you have any of these conditions: infection (especially a virus infection such as chickenpox, cold sores, or herpes) kidney disease liver disease an unusual or allergic reaction to bendamustine, mannitol, other medicines, foods, dyes, or preservatives pregnant or trying to get pregnant breast-feeding How should I use this medication? This medicine is for infusion into a vein. It is given by a health care professional in a hospital or clinic setting. Talk to your pediatrician regarding the use of this medicine in children. Special care may be needed. Overdosage: If you think you have taken  too much of this medicine contact a poison control center or emergency room at once. NOTE: This medicine is only for you. Do not share this medicine with others. What if I miss a dose? It is important not to miss your dose. Call your doctor or health care professional if you are unable to keep an appointment. What may interact with this medication? Do not take this medicine with any of the following medications: clozapine This medicine may also interact with the following medications: atazanavir cimetidine ciprofloxacin enoxacin fluvoxamine medicines for seizures like carbamazepine and phenobarbital mexiletine rifampin tacrine thiabendazole zileuton This list may not describe all possible interactions. Give your health care provider a list of all the medicines, herbs, non-prescription drugs, or dietary supplements you use. Also tell them if you smoke, drink alcohol, or use illegal drugs. Some items may interact with your medicine. What should I watch for while using this medication? This drug may make you feel generally unwell. This is not uncommon, as chemotherapy can affect healthy cells as well as cancer cells. Report any side effects. Continue your course of treatment even though you feel ill unless your doctor tells you to stop. You may need blood work done while you are taking this medicine. Call your doctor or healthcare provider for advice if you get a fever, chills or sore throat, or other symptoms of a cold or  flu. Do not treat yourself. This drug decreases your body's ability to fight infections. Try to avoid being around people who are sick. This medicine may cause serious skin reactions. They can happen weeks to months after starting the medicine. Contact your healthcare provider right away if you notice fevers or flu-like symptoms with a rash. The rash may be red or purple and then turn into blisters or peeling of the skin. Or, you might notice a red rash with swelling of the  face, lips or lymph nodes in your neck or under your arms. In some patients, this medicine may cause a serious brain infection that may cause death. If you have any problems seeing, thinking, speaking, walking, or standing, tell your health care provider right away. If you cannot reach your health care provider, urgently seek other source of medical care. This medicine may increase your risk to bruise or bleed. Call your doctor or healthcare provider if you notice any unusual bleeding. Talk to your doctor about your risk of cancer. You may be more at risk for certain types of cancers if you take this medicine. This medicine may increase your risk of skin cancer. Check your skin for changes to moles or for new growths while taking this medicine. Call your health care provider if you notice any of these skin changes. Do not become pregnant while taking this medicine or for at least 6 months after stopping it. Women should inform their doctor if they wish to become pregnant or think they might be pregnant. Men should not father a child while taking this medicine and for at least 3 months after stopping it. There is a potential for serious side effects to an unborn child. Talk to your healthcare provider or pharmacist for more information. Do not breast-feed an infant while taking this medicine or for at least 1 week after stopping it. This medicine may make it more difficult to father a child. You should talk with your doctor or healthcare provider if you are concerned about your fertility. What side effects may I notice from receiving this medication? Side effects that you should report to your doctor or health care professional as soon as possible: allergic reactions like skin rash, itching or hives, swelling of the face, lips, or tongue low blood counts - this medicine may decrease the number of white blood cells, red blood cells and platelets. You may be at increased risk for infections and  bleeding. rash, fever, and swollen lymph nodes redness, blistering, peeling, or loosening of the skin, including inside the mouth signs of infection like fever or chills, cough, sore throat, pain or difficulty passing urine signs of decreased platelets or bleeding like bruising, pinpoint red spots on the skin, black, tarry stools, blood in the urine signs of decreased red blood cells like being unusually weak or tired, fainting spells, lightheadedness signs and symptoms of kidney injury like trouble passing urine or change in the amount of urine signs and symptoms of liver injury like dark yellow or brown urine; general ill feeling or flu-like symptoms; light-colored stools; loss of appetite; nausea; right upper belly pain; unusually weak or tired; yellowing of the eyes or skin Side effects that usually do not require medical attention (report to your doctor or health care professional if they continue or are bothersome): constipation decreased appetite diarrhea headache mouth sores nausea, vomiting tiredness This list may not describe all possible side effects. Call your doctor for medical advice about side effects. You may report side  effects to FDA at 1-800-FDA-1088. Where should I keep my medication? This drug is given in a hospital or clinic and will not be stored at home. NOTE: This sheet is a summary. It may not cover all possible information. If you have questions about this medicine, talk to your doctor, pharmacist, or health care provider.  2022 [employer]/Gold Standard (2019-08-13 12:11:43)

## 2020-12-31 NOTE — Progress Notes (Signed)
Discharged home, stable  

## 2021-01-01 ENCOUNTER — Inpatient Hospital Stay: Payer: Medicare PPO

## 2021-01-01 ENCOUNTER — Other Ambulatory Visit: Payer: Self-pay

## 2021-01-01 ENCOUNTER — Encounter: Payer: Self-pay | Admitting: Oncology

## 2021-01-01 VITALS — BP 132/61 | HR 63 | Resp 20 | Wt 191.0 lb

## 2021-01-01 DIAGNOSIS — C8309 Small cell B-cell lymphoma, extranodal and solid organ sites: Secondary | ICD-10-CM

## 2021-01-01 DIAGNOSIS — Z5112 Encounter for antineoplastic immunotherapy: Secondary | ICD-10-CM | POA: Diagnosis not present

## 2021-01-01 MED ORDER — PEGFILGRASTIM-CBQV 6 MG/0.6ML ~~LOC~~ SOSY
6.0000 mg | PREFILLED_SYRINGE | Freq: Once | SUBCUTANEOUS | Status: AC
  Administered 2021-01-01: 6 mg via SUBCUTANEOUS
  Filled 2021-01-01: qty 0.6

## 2021-01-01 NOTE — Progress Notes (Signed)
Discharged home, stable  

## 2021-01-01 NOTE — Patient Instructions (Signed)
Pegfilgrastim injection What is this medication? PEGFILGRASTIM (PEG fil gra stim) is a long-acting granulocyte colony-stimulating factor that stimulates the growth of neutrophils, a type of white blood cell important in the body's fight against infection. It is used to reduce the incidence of fever and infection in patients with certain types of cancer who are receiving chemotherapy that affects the bone marrow, and to increase survival after being exposed to high doses of radiation. This medicine may be used for other purposes; ask your health care provider or pharmacist if you have questions. COMMON BRAND NAME(S): Rexene Edison, Ziextenzo What should I tell my care team before I take this medication? They need to know if you have any of these conditions: kidney disease latex allergy ongoing radiation therapy sickle cell disease skin reactions to acrylic adhesives (On-Body Injector only) an unusual or allergic reaction to pegfilgrastim, filgrastim, other medicines, foods, dyes, or preservatives pregnant or trying to get pregnant breast-feeding How should I use this medication? This medicine is for injection under the skin. If you get this medicine at home, you will be taught how to prepare and give the pre-filled syringe or how to use the On-body Injector. Refer to the patient Instructions for Use for detailed instructions. Use exactly as directed. Tell your healthcare provider immediately if you suspect that the On-body Injector may not have performed as intended or if you suspect the use of the On-body Injector resulted in a missed or partial dose. It is important that you put your used needles and syringes in a special sharps container. Do not put them in a trash can. If you do not have a sharps container, call your pharmacist or healthcare provider to get one. Talk to your pediatrician regarding the use of this medicine in children. While this drug may be prescribed for  selected conditions, precautions do apply. Overdosage: If you think you have taken too much of this medicine contact a poison control center or emergency room at once. NOTE: This medicine is only for you. Do not share this medicine with others. What if I miss a dose? It is important not to miss your dose. Call your doctor or health care professional if you miss your dose. If you miss a dose due to an On-body Injector failure or leakage, a new dose should be administered as soon as possible using a single prefilled syringe for manual use. What may interact with this medication? Interactions have not been studied. This list may not describe all possible interactions. Give your health care provider a list of all the medicines, herbs, non-prescription drugs, or dietary supplements you use. Also tell them if you smoke, drink alcohol, or use illegal drugs. Some items may interact with your medicine. What should I watch for while using this medication? Your condition will be monitored carefully while you are receiving this medicine. You may need blood work done while you are taking this medicine. Talk to your health care provider about your risk of cancer. You may be more at risk for certain types of cancer if you take this medicine. If you are going to need a MRI, CT scan, or other procedure, tell your doctor that you are using this medicine (On-Body Injector only). What side effects may I notice from receiving this medication? Side effects that you should report to your doctor or health care professional as soon as possible: allergic reactions (skin rash, itching or hives, swelling of the face, lips, or tongue) back pain dizziness fever pain,  redness, or irritation at site where injected pinpoint red spots on the skin red or dark-brown urine shortness of breath or breathing problems stomach or side pain, or pain at the shoulder swelling tiredness trouble passing urine or change in the amount of  urine unusual bruising or bleeding Side effects that usually do not require medical attention (report to your doctor or health care professional if they continue or are bothersome): bone pain muscle pain This list may not describe all possible side effects. Call your doctor for medical advice about side effects. You may report side effects to FDA at 1-800-FDA-1088. Where should I keep my medication? Keep out of the reach of children. If you are using this medicine at home, you will be instructed on how to store it. Throw away any unused medicine after the expiration date on the label. NOTE: This sheet is a summary. It may not cover all possible information. If you have questions about this medicine, talk to your doctor, pharmacist, or health care provider.  2022 [employer] Standard (2020-03-14 11:54:14) Epoetin Alfa injection What is this medication? EPOETIN ALFA (e POE e tin AL fa) helps your body make more red blood cells. This medicine is used to treat anemia caused by chronic kidney disease, cancer chemotherapy, or HIV-therapy. It may also be used before surgery if you have anemia. This medicine may be used for other purposes; ask your health care provider or pharmacist if you have questions. COMMON BRAND NAME(S): Epogen, Procrit, Retacrit What should I tell my care team before I take this medication? They need to know if you have any of these conditions: cancer heart disease high blood pressure history of blood clots history of stroke low levels of folate, iron, or vitamin B12 in the blood seizures an unusual or allergic reaction to erythropoietin, albumin, benzyl alcohol, hamster proteins, other medicines, foods, dyes, or preservatives pregnant or trying to get pregnant breast-feeding How should I use this medication? This medicine is for injection into a vein or under the skin. It is usually given by a health care professional in a hospital or clinic setting. If you get this  medicine at home, you will be taught how to prepare and give this medicine. Use exactly as directed. Take your medicine at regular intervals. Do not take your medicine more often than directed. It is important that you put your used needles and syringes in a special sharps container. Do not put them in a trash can. If you do not have a sharps container, call your pharmacist or healthcare provider to get one. A special MedGuide will be given to you by the pharmacist with each prescription and refill. Be sure to read this information carefully each time. Talk to your pediatrician regarding the use of this medicine in children. While this drug may be prescribed for selected conditions, precautions do apply. Overdosage: If you think you have taken too much of this medicine contact a poison control center or emergency room at once. NOTE: This medicine is only for you. Do not share this medicine with others. What if I miss a dose? If you miss a dose, take it as soon as you can. If it is almost time for your next dose, take only that dose. Do not take double or extra doses. What may interact with this medication? Interactions have not been studied. This list may not describe all possible interactions. Give your health care provider a list of all the medicines, herbs, non-prescription drugs, or dietary supplements you  use. Also tell them if you smoke, drink alcohol, or use illegal drugs. Some items may interact with your medicine. What should I watch for while using this medication? Your condition will be monitored carefully while you are receiving this medicine. You may need blood work done while you are taking this medicine. This medicine may cause a decrease in vitamin B6. You should make sure that you get enough vitamin B6 while you are taking this medicine. Discuss the foods you eat and the vitamins you take with your health care professional. What side effects may I notice from receiving this  medication? Side effects that you should report to your doctor or health care professional as soon as possible: allergic reactions like skin rash, itching or hives, swelling of the face, lips, or tongue seizures signs and symptoms of a blood clot such as breathing problems; changes in vision; chest pain; severe, sudden headache; pain, swelling, warmth in the leg; trouble speaking; sudden numbness or weakness of the face, arm or leg signs and symptoms of a stroke like changes in vision; confusion; trouble speaking or understanding; severe headaches; sudden numbness or weakness of the face, arm or leg; trouble walking; dizziness; loss of balance or coordination Side effects that usually do not require medical attention (report to your doctor or health care professional if they continue or are bothersome): chills cough dizziness fever headaches joint pain muscle cramps muscle pain nausea, vomiting pain, redness, or irritation at site where injected This list may not describe all possible side effects. Call your doctor for medical advice about side effects. You may report side effects to FDA at 1-800-FDA-1088. Where should I keep my medication? Keep out of the reach of children. Store in a refrigerator between 2 and 8 degrees C (36 and 46 degrees F). Do not freeze or shake. Throw away any unused portion if using a single-dose vial. Multi-dose vials can be kept in the refrigerator for up to 21 days after the initial dose. Throw away unused medicine. NOTE: This sheet is a summary. It may not cover all possible information. If you have questions about this medicine, talk to your doctor, pharmacist, or health care provider.  2022 [employer] Standard (2016-09-24 08:35:19)

## 2021-01-09 ENCOUNTER — Encounter: Payer: Self-pay | Admitting: Oncology

## 2021-01-15 NOTE — Progress Notes (Signed)
[facility]  [address] [place],  [place]  [id number] [phone]  Clinic Day:  01/26/2021  Referring physician: Algis Greenhouse, MD  This document serves as a record of services personally performed by Dequincy Macarthur Critchley, MD. It was created on their behalf by [facility], a trained medical scribe. The creation of this record is based on the scribe's personal observations and the provider's statements to them.  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  She comes in today prior to her 3rd cycle of Bendamustine/Rituxan therapy.  She tolerated her 2nd cycle okay, but did have more nausea/vomiting with that cycle of chemotherapy.  She also claims to have felt weaker over a much more prolonged period of time.  She continues to deny having any B symptoms or bulky lymphadenopathy which concerns her for disease progression while on her current regimen.  PHYSICAL EXAM:  Blood pressure (!) 200/84, pulse 77, temperature 98.7 F (37.1 C), resp. rate 16, height 5\' 5"  (1.651 m), weight 185 lb 8 oz (84.1 kg), SpO2 100 %. Wt Readings from Last 3 Encounters:  01/26/21 185 lb 8 oz (84.1 kg)  01/01/21 191 lb (86.6 kg)  12/30/20 191 lb (86.6 kg)   Body mass index is 30.87 kg/m. Performance status (ECOG): 1 - Symptomatic but completely ambulatory Physical Exam Constitutional:      Appearance: Normal appearance. She is not ill-appearing.  HENT:     Mouth/Throat:     Mouth: Mucous membranes are moist.     Pharynx: Oropharynx is clear. No oropharyngeal exudate or posterior oropharyngeal erythema.  Cardiovascular:     Rate and Rhythm: Normal rate and regular rhythm.     Heart sounds: No murmur heard.   No friction rub. No gallop.  Pulmonary:     Effort: Pulmonary effort is normal. No respiratory distress.     Breath sounds: Normal breath sounds. No wheezing, rhonchi or rales.  Abdominal:     General: Bowel sounds  are normal. There is no distension.     Palpations: Abdomen is soft. There is no mass.     Tenderness: There is no abdominal tenderness.  Musculoskeletal:        General: No swelling.     Right lower leg: No edema.     Left lower leg: No edema.  Lymphadenopathy:     Cervical: No cervical adenopathy.     Upper Body:     Right upper body: No supraclavicular or axillary adenopathy.     Left upper body: No supraclavicular or axillary adenopathy.     Lower Body: No right inguinal adenopathy. No left inguinal adenopathy.  Skin:    General: Skin is warm.     Coloration: Skin is not jaundiced.     Findings: No lesion or rash.  Neurological:     General: No focal deficit present.     Mental Status: She is alert and oriented to person, place, and time. Mental status is at baseline.  Psychiatric:        Mood and Affect: Mood normal.        Behavior: Behavior normal.        Thought Content: Thought content normal.   LABS:  Latest Reference Range & Units 01/26/21 00:00  Sodium 137 - 147  141 (E)  Potassium 3.4 - 5.3  3.1 ! (E)  Chloride 99 - 108  108 (E)  CO2 13 -  22  29 ! (E)  Glucose  116 (E)  BUN 4 - 21  6 (E)  Creatinine 0.5 - 1.1  0.7 (E)  Calcium 8.7 - 10.7  8.2 ! (E)  Alkaline Phosphatase 25 - 125  62 (E)  Albumin 3.5 - 5.0  3.5 (E)  AST 13 - 35  36 ! (E)  ALT 7 - 35  22 (E)  Bilirubin, Total  1.0 (E)  WBC  1.8 (E)  RBC 3.87 - 5.11  3.02 ! (E)  Hemoglobin 12.0 - 16.0  8.4 ! (E)  HCT 36 - 46  26 ! (E)  Platelets 150 - 399  137 ! 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 She will proceed with her 3rd cycle of Bendamustine/Rituxan this week.  Due to the prolonged problems she had from her second cycle of chemotherapy, her Bendamustine dose will be decreased by 20%.   She will continue to receive Neulasta with each cycle of treatment to prevent severe neutropenia.  Retacrit 40,000 units will also be given to  hasten her chemotherapy-induced anemia.   Otherwise, I will see her back in 4 weeks before she heads into her 4th cycle of Bendamustine/Rituxan.  Labs and scans will be done before next visit to ascertain her new disease baseline after 3 cycles of treatment.  The patient understands all the plans discussed today and is in agreement with them.     I, Rita Ohara, am acting as scribe for [name], MD    I have reviewed this report as typed by the medical scribe, and it is complete and accurate.  Dequincy Macarthur Critchley, MD

## 2021-01-20 ENCOUNTER — Other Ambulatory Visit: Payer: Medicare PPO

## 2021-01-20 ENCOUNTER — Ambulatory Visit: Payer: Medicare PPO | Admitting: Oncology

## 2021-01-21 ENCOUNTER — Other Ambulatory Visit: Payer: Self-pay | Admitting: Pharmacist

## 2021-01-25 ENCOUNTER — Other Ambulatory Visit: Payer: Self-pay | Admitting: Oncology

## 2021-01-25 DIAGNOSIS — C8309 Small cell B-cell lymphoma, extranodal and solid organ sites: Secondary | ICD-10-CM

## 2021-01-26 ENCOUNTER — Other Ambulatory Visit: Payer: Self-pay | Admitting: Hematology and Oncology

## 2021-01-26 ENCOUNTER — Encounter: Payer: Self-pay | Admitting: Oncology

## 2021-01-26 ENCOUNTER — Inpatient Hospital Stay (INDEPENDENT_AMBULATORY_CARE_PROVIDER_SITE_OTHER): Payer: Medicare PPO | Admitting: Oncology

## 2021-01-26 ENCOUNTER — Other Ambulatory Visit: Payer: Self-pay

## 2021-01-26 ENCOUNTER — Other Ambulatory Visit: Payer: Self-pay | Admitting: Oncology

## 2021-01-26 ENCOUNTER — Inpatient Hospital Stay: Payer: Medicare PPO

## 2021-01-26 ENCOUNTER — Telehealth: Payer: Self-pay | Admitting: Oncology

## 2021-01-26 VITALS — BP 200/84 | HR 77 | Temp 98.7°F | Resp 16 | Ht 65.0 in | Wt 185.5 lb

## 2021-01-26 DIAGNOSIS — C8309 Small cell B-cell lymphoma, extranodal and solid organ sites: Secondary | ICD-10-CM

## 2021-01-26 DIAGNOSIS — Z5112 Encounter for antineoplastic immunotherapy: Secondary | ICD-10-CM | POA: Diagnosis not present

## 2021-01-26 LAB — COMPREHENSIVE METABOLIC PANEL
Albumin: 3.5 (ref 3.5–5.0)
Calcium: 8.2 — AB (ref 8.7–10.7)

## 2021-01-26 LAB — CBC AND DIFFERENTIAL
HCT: 26 — AB (ref 36–46)
Hemoglobin: 8.4 — AB (ref 12.0–16.0)
Neutrophils Absolute: 0.97
Platelets: 137 — AB (ref 150–399)
WBC: 1.8

## 2021-01-26 LAB — HEPATIC FUNCTION PANEL
ALT: 22 (ref 7–35)
AST: 36 — AB (ref 13–35)
Alkaline Phosphatase: 62 (ref 25–125)
Bilirubin, Total: 1

## 2021-01-26 LAB — BASIC METABOLIC PANEL
BUN: 6 (ref 4–21)
CO2: 29 — AB (ref 13–22)
Chloride: 108 (ref 99–108)
Creatinine: 0.7 (ref 0.5–1.1)
Glucose: 116
Potassium: 3.1 — AB (ref 3.4–5.3)
Sodium: 141 (ref 137–147)

## 2021-01-26 LAB — CBC: RBC: 3.02 — AB (ref 3.87–5.11)

## 2021-01-26 MED ORDER — PREDNISONE 20 MG PO TABS: 20.0000 mg | ORAL_TABLET | Freq: Every day | ORAL | 0 refills | Status: DC

## 2021-01-26 NOTE — Telephone Encounter (Signed)
Per 11/28 los next appt scheduled and confirmed with patient

## 2021-01-26 NOTE — Progress Notes (Signed)
Decrease dose of bendamustine by 20% for all future doses due to moderate to severe nausea per Dr. Bobby Rumpf.

## 2021-01-27 ENCOUNTER — Other Ambulatory Visit: Payer: Self-pay

## 2021-01-27 ENCOUNTER — Other Ambulatory Visit: Payer: Self-pay | Admitting: Pharmacist

## 2021-01-27 LAB — KAPPA/LAMBDA LIGHT CHAINS
Kappa free light chain: 487.3 mg/L — ABNORMAL HIGH (ref 3.3–19.4)
Kappa, lambda light chain ratio: 72.73 — ABNORMAL HIGH (ref 0.26–1.65)
Lambda free light chains: 6.7 mg/L (ref 5.7–26.3)

## 2021-01-27 MED FILL — Bendamustine HCl IV Soln 100 MG/4ML (25 MG/ML): INTRAVENOUS | Qty: 6 | Status: AC

## 2021-01-27 MED FILL — Rituximab-pvvr IV Soln 500 MG/50ML (10 MG/ML): INTRAVENOUS | Qty: 80 | Status: AC

## 2021-01-27 MED FILL — Dexamethasone Sodium Phosphate Inj 100 MG/10ML: INTRAMUSCULAR | Qty: 1 | Status: AC

## 2021-01-27 NOTE — Progress Notes (Signed)
Proceed with chemo despite ANC = 970 and Hemoglobin = 8.4 per Dr. Bobby Rumpf.  Pt's bendamustine dose is being reduced by 20% and patient will receive pegfilgrastim with each cycle.  Also potassium was 3.1 and Dr. Bobby Rumpf wants patient to receive 11meq KCL by mouth once daily x 5 days.

## 2021-01-28 ENCOUNTER — Other Ambulatory Visit: Payer: Self-pay

## 2021-01-28 ENCOUNTER — Inpatient Hospital Stay: Payer: Medicare PPO

## 2021-01-28 ENCOUNTER — Ambulatory Visit: Payer: Medicare PPO

## 2021-01-28 ENCOUNTER — Other Ambulatory Visit: Payer: Self-pay | Admitting: Hematology and Oncology

## 2021-01-28 VITALS — BP 156/65 | HR 76 | Temp 98.3°F | Resp 18 | Ht 65.0 in | Wt 185.0 lb

## 2021-01-28 DIAGNOSIS — D508 Other iron deficiency anemias: Secondary | ICD-10-CM

## 2021-01-28 DIAGNOSIS — C8309 Small cell B-cell lymphoma, extranodal and solid organ sites: Secondary | ICD-10-CM

## 2021-01-28 DIAGNOSIS — T451X5A Adverse effect of antineoplastic and immunosuppressive drugs, initial encounter: Secondary | ICD-10-CM

## 2021-01-28 DIAGNOSIS — Z5112 Encounter for antineoplastic immunotherapy: Secondary | ICD-10-CM | POA: Diagnosis not present

## 2021-01-28 MED ORDER — SODIUM CHLORIDE 0.9 % IV SOLN
10.0000 mg | Freq: Once | INTRAVENOUS | Status: AC
  Administered 2021-01-28: 10 mg via INTRAVENOUS
  Filled 2021-01-28: qty 10

## 2021-01-28 MED ORDER — SODIUM CHLORIDE 0.9% FLUSH
10.0000 mL | INTRAVENOUS | Status: DC | PRN
  Administered 2021-01-28: 10 mL

## 2021-01-28 MED ORDER — SODIUM CHLORIDE 0.9 % IV SOLN
Freq: Once | INTRAVENOUS | Status: AC
Start: 2021-01-28 — End: 2021-01-28

## 2021-01-28 MED ORDER — DIPHENHYDRAMINE HCL 25 MG PO CAPS
50.0000 mg | ORAL_CAPSULE | Freq: Once | ORAL | Status: AC
  Administered 2021-01-28: 50 mg via ORAL
  Filled 2021-01-28: qty 2

## 2021-01-28 MED ORDER — PALONOSETRON HCL INJECTION 0.25 MG/5ML
0.2500 mg | Freq: Once | INTRAVENOUS | Status: AC
  Administered 2021-01-28: 0.25 mg via INTRAVENOUS
  Filled 2021-01-28: qty 5

## 2021-01-28 MED ORDER — SODIUM CHLORIDE 0.9 % IV SOLN
72.0000 mg/m2 | Freq: Once | INTRAVENOUS | Status: AC
  Administered 2021-01-28: 150 mg via INTRAVENOUS
  Filled 2021-01-28: qty 6

## 2021-01-28 MED ORDER — EPOETIN ALFA-EPBX 40000 UNIT/ML IJ SOLN
40000.0000 [IU] | Freq: Once | INTRAMUSCULAR | Status: AC
  Administered 2021-01-28: 40000 [IU] via SUBCUTANEOUS
  Filled 2021-01-28: qty 1

## 2021-01-28 MED ORDER — SODIUM CHLORIDE 0.9 % IV SOLN
375.0000 mg/m2 | Freq: Once | INTRAVENOUS | Status: AC
  Administered 2021-01-28: 800 mg via INTRAVENOUS
  Filled 2021-01-28: qty 50

## 2021-01-28 MED ORDER — LORAZEPAM 1 MG PO TABS: 1.0000 mg | ORAL_TABLET | Freq: Three times a day (TID) | ORAL | 0 refills | Status: DC

## 2021-01-28 MED ORDER — ACETAMINOPHEN 325 MG PO TABS
650.0000 mg | ORAL_TABLET | Freq: Once | ORAL | Status: AC
  Administered 2021-01-28: 650 mg via ORAL
  Filled 2021-01-28: qty 2

## 2021-01-28 MED ORDER — HEPARIN SOD (PORK) LOCK FLUSH 100 UNIT/ML IV SOLN
500.0000 [IU] | Freq: Once | INTRAVENOUS | Status: AC | PRN
  Administered 2021-01-28: 500 [IU]

## 2021-01-28 MED FILL — Dexamethasone Sodium Phosphate Inj 100 MG/10ML: INTRAMUSCULAR | Qty: 1 | Status: AC

## 2021-01-28 MED FILL — Bendamustine HCl IV Soln 100 MG/4ML (25 MG/ML): INTRAVENOUS | Qty: 6 | Status: AC

## 2021-01-28 NOTE — Patient Instructions (Signed)
Rituximab; Hyaluronidase injection What is this medication? RITUXIMAB; HYALURONIDASE (ri TUX i mab / hye al ur ON i dase) is used to treat non-Hodgkin lymphoma and chronic lymphocytic leukemia. Rituximab is a monoclonal antibody. Hyaluronidase is used to improve the effects of rituximab. This medicine may be used for other purposes; ask your health care provider or pharmacist if you have questions. COMMON BRAND NAME(S): Rituxan Hycela What should I tell my care team before I take this medication? They need to know if you have any of these conditions: heart disease infection (especially a virus infection such as hepatitis B, chickenpox, cold sores, or herpes) immune system problems irregular heartbeat kidney disease lung or breathing disease, like asthma recently received or scheduled to receive a vaccine an unusual or allergic reaction to rituximab, rituximab/hyaluronidase, mouse proteins, other medicines, foods, dyes, or preservatives pregnant or trying to get pregnant breast-feeding How should I use this medication? This medicine is for injection under the skin. It is given by a health care professional in a hospital or clinic setting. A special MedGuide will be given to you before each treatment. Be sure to read this information carefully each time. Talk to your pediatrician regarding the use of this medicine in children. Special care may be needed. Overdosage: If you think you have taken too much of this medicine contact a poison control center or emergency room at once. NOTE: This medicine is only for you. Do not share this medicine with others. What if I miss a dose? It is important not to miss your dose. Call your doctor or health care professional if you are unable to keep an appointment. What may interact with this medication? This medicine may interact with the following medications: cisplatin live virus vaccines This list may not describe all possible interactions. Give your  health care provider a list of all the medicines, herbs, non-prescription drugs, or dietary supplements you use. Also tell them if you smoke, drink alcohol, or use illegal drugs. Some items may interact with your medicine. What should I watch for while using this medication? Your condition will be monitored carefully while you are receiving this medicine. You may need blood work done while you are taking this medicine. This medicine can cause serious allergic reactions. To reduce your risk you may need to take medicine before treatment with this medicine. Take your medicine as directed. In some patients, this medicine may cause a serious brain infection that may cause death. If you have any problems seeing, thinking, speaking, walking, or standing, tell your doctor right away. If you cannot reach your doctor, urgently seek other source of medical care. Call your doctor or health care professional for advice if you get a fever, chills or sore throat, or other symptoms of a cold or flu. Do not treat yourself. This drug decreases your body's ability to fight infections. Try to avoid being around people who are sick. Do not become pregnant while taking this medicine or for 12 months after stopping it. Women should inform their doctor if they wish to become pregnant or think they might be pregnant. There is a potential for serious side effects to an unborn child. Talk to your health care professional or pharmacist for more information. Do not breast-feed an infant while taking this medicine or for at least 6 months after stopping it. What side effects may I notice from receiving this medication? Side effects that you should report to your doctor or health care professional as soon as possible: allergic reactions  like skin rash, itching or hives; swelling of the face, lips, or tongue breathing problems chest pain changes in vision diarrhea dizziness headache with fever, neck stiffness, sensitivity to light,  nausea, or confusion fast, irregular heartbeat loss of memory low blood counts - this medicine may decrease the number of white blood cells, red blood cells and platelets. You may be at increased risk for infections and bleeding. mouth sores problems with balance, talking, or walking redness, blistering, peeling or loosening of the skin, including inside the mouth signs of infection - fever or chills, cough, sore throat, pain or difficulty passing urine signs and symptoms of kidney injury like trouble passing urine or change in the amount of urine signs and symptoms of liver injury like dark yellow or brown urine; general ill feeling or flu-like symptoms; light-colored stools; loss of appetite; nausea; right upper belly pain; unusually weak or tired; yellowing of the eyes or skin stomach pain swelling of the ankles, feet, hands unusual bleeding or bruising vomiting Side effects that usually do not require medical attention (report these to your doctor or health care professional if they continue or are bothersome): headache joint pain muscle cramps or muscle pain nausea pain, redness, or irritation at site where injected tiredness This list may not describe all possible side effects. Call your doctor for medical advice about side effects. You may report side effects to FDA at 1-800-FDA-1088. Where should I keep my medication? This drug is given in a hospital or clinic and will not be stored at home. NOTE: This sheet is a summary. It may not cover all possible information. If you have questions about this medicine, talk to your doctor, pharmacist, or health care provider.  2022 Elsevier/Gold Standard (2017-01-28 00:00:00) Bendamustine Injection What is this medication? BENDAMUSTINE (BEN da MUS teen) is a chemotherapy drug. It is used to treat chronic lymphocytic leukemia and non-Hodgkin lymphoma. This medicine may be used for other purposes; ask your health care provider or pharmacist if  you have questions. COMMON BRAND NAME(S): Kristine Royal, Treanda What should I tell my care team before I take this medication? They need to know if you have any of these conditions: infection (especially a virus infection such as chickenpox, cold sores, or herpes) kidney disease liver disease an unusual or allergic reaction to bendamustine, mannitol, other medicines, foods, dyes, or preservatives pregnant or trying to get pregnant breast-feeding How should I use this medication? This medicine is for infusion into a vein. It is given by a health care professional in a hospital or clinic setting. Talk to your pediatrician regarding the use of this medicine in children. Special care may be needed. Overdosage: If you think you have taken too much of this medicine contact a poison control center or emergency room at once. NOTE: This medicine is only for you. Do not share this medicine with others. What if I miss a dose? It is important not to miss your dose. Call your doctor or health care professional if you are unable to keep an appointment. What may interact with this medication? Do not take this medicine with any of the following medications: clozapine This medicine may also interact with the following medications: atazanavir cimetidine ciprofloxacin enoxacin fluvoxamine medicines for seizures like carbamazepine and phenobarbital mexiletine rifampin tacrine thiabendazole zileuton This list may not describe all possible interactions. Give your health care provider a list of all the medicines, herbs, non-prescription drugs, or dietary supplements you use. Also tell them if you smoke, drink alcohol, or  use illegal drugs. Some items may interact with your medicine. What should I watch for while using this medication? This drug may make you feel generally unwell. This is not uncommon, as chemotherapy can affect healthy cells as well as cancer cells. Report any side effects. Continue  your course of treatment even though you feel ill unless your doctor tells you to stop. You may need blood work done while you are taking this medicine. Call your doctor or healthcare provider for advice if you get a fever, chills or sore throat, or other symptoms of a cold or flu. Do not treat yourself. This drug decreases your body's ability to fight infections. Try to avoid being around people who are sick. This medicine may cause serious skin reactions. They can happen weeks to months after starting the medicine. Contact your healthcare provider right away if you notice fevers or flu-like symptoms with a rash. The rash may be red or purple and then turn into blisters or peeling of the skin. Or, you might notice a red rash with swelling of the face, lips or lymph nodes in your neck or under your arms. In some patients, this medicine may cause a serious brain infection that may cause death. If you have any problems seeing, thinking, speaking, walking, or standing, tell your health care provider right away. If you cannot reach your health care provider, urgently seek other source of medical care. This medicine may increase your risk to bruise or bleed. Call your doctor or healthcare provider if you notice any unusual bleeding. Talk to your doctor about your risk of cancer. You may be more at risk for certain types of cancers if you take this medicine. This medicine may increase your risk of skin cancer. Check your skin for changes to moles or for new growths while taking this medicine. Call your health care provider if you notice any of these skin changes. Do not become pregnant while taking this medicine or for at least 6 months after stopping it. Women should inform their doctor if they wish to become pregnant or think they might be pregnant. Men should not father a child while taking this medicine and for at least 3 months after stopping it. There is a potential for serious side effects to an unborn  child. Talk to your healthcare provider or pharmacist for more information. Do not breast-feed an infant while taking this medicine or for at least 1 week after stopping it. This medicine may make it more difficult to father a child. You should talk with your doctor or healthcare provider if you are concerned about your fertility. What side effects may I notice from receiving this medication? Side effects that you should report to your doctor or health care professional as soon as possible: allergic reactions like skin rash, itching or hives, swelling of the face, lips, or tongue low blood counts - this medicine may decrease the number of white blood cells, red blood cells and platelets. You may be at increased risk for infections and bleeding. rash, fever, and swollen lymph nodes redness, blistering, peeling, or loosening of the skin, including inside the mouth signs of infection like fever or chills, cough, sore throat, pain or difficulty passing urine signs of decreased platelets or bleeding like bruising, pinpoint red spots on the skin, black, tarry stools, blood in the urine signs of decreased red blood cells like being unusually weak or tired, fainting spells, lightheadedness signs and symptoms of kidney injury like trouble passing urine or  change in the amount of urine signs and symptoms of liver injury like dark yellow or brown urine; general ill feeling or flu-like symptoms; light-colored stools; loss of appetite; nausea; right upper belly pain; unusually weak or tired; yellowing of the eyes or skin Side effects that usually do not require medical attention (report to your doctor or health care professional if they continue or are bothersome): constipation decreased appetite diarrhea headache mouth sores nausea, vomiting tiredness This list may not describe all possible side effects. Call your doctor for medical advice about side effects. You may report side effects to FDA at  1-800-FDA-1088. Where should I keep my medication? This drug is given in a hospital or clinic and will not be stored at home. NOTE: This sheet is a summary. It may not cover all possible information. If you have questions about this medicine, talk to your doctor, pharmacist, or health care provider.  2022 Elsevier/Gold Standard (2019-08-14 00:00:00)

## 2021-01-29 ENCOUNTER — Inpatient Hospital Stay: Payer: Medicare PPO | Attending: Oncology

## 2021-01-29 ENCOUNTER — Other Ambulatory Visit: Payer: Self-pay | Admitting: Hematology and Oncology

## 2021-01-29 VITALS — BP 177/58 | HR 61 | Temp 97.7°F | Resp 18 | Ht 65.0 in | Wt 189.1 lb

## 2021-01-29 DIAGNOSIS — C8590 Non-Hodgkin lymphoma, unspecified, unspecified site: Secondary | ICD-10-CM | POA: Insufficient documentation

## 2021-01-29 DIAGNOSIS — Z5111 Encounter for antineoplastic chemotherapy: Secondary | ICD-10-CM | POA: Diagnosis present

## 2021-01-29 DIAGNOSIS — Z5112 Encounter for antineoplastic immunotherapy: Secondary | ICD-10-CM | POA: Diagnosis present

## 2021-01-29 DIAGNOSIS — Z5189 Encounter for other specified aftercare: Secondary | ICD-10-CM | POA: Insufficient documentation

## 2021-01-29 DIAGNOSIS — C8309 Small cell B-cell lymphoma, extranodal and solid organ sites: Secondary | ICD-10-CM

## 2021-01-29 DIAGNOSIS — D6481 Anemia due to antineoplastic chemotherapy: Secondary | ICD-10-CM | POA: Diagnosis not present

## 2021-01-29 MED ORDER — SODIUM CHLORIDE 0.9 % IV SOLN
72.0000 mg/m2 | Freq: Once | INTRAVENOUS | Status: AC
  Administered 2021-01-29: 150 mg via INTRAVENOUS
  Filled 2021-01-29: qty 6

## 2021-01-29 MED ORDER — POTASSIUM CHLORIDE CRYS ER 20 MEQ PO TBCR: 20.0000 meq | EXTENDED_RELEASE_TABLET | Freq: Every day | ORAL | 3 refills | Status: DC

## 2021-01-29 MED ORDER — SODIUM CHLORIDE 0.9% FLUSH
10.0000 mL | INTRAVENOUS | Status: DC | PRN
  Administered 2021-01-29: 10 mL

## 2021-01-29 MED ORDER — HEPARIN SOD (PORK) LOCK FLUSH 100 UNIT/ML IV SOLN
500.0000 [IU] | Freq: Once | INTRAVENOUS | Status: AC | PRN
  Administered 2021-01-29: 500 [IU]

## 2021-01-29 MED ORDER — SODIUM CHLORIDE 0.9 % IV SOLN
10.0000 mg | Freq: Once | INTRAVENOUS | Status: AC
  Administered 2021-01-29: 10 mg via INTRAVENOUS
  Filled 2021-01-29: qty 10

## 2021-01-29 MED ORDER — SODIUM CHLORIDE 0.9 % IV SOLN
Freq: Once | INTRAVENOUS | Status: AC
Start: 2021-01-29 — End: 2021-01-29

## 2021-01-29 NOTE — Patient Instructions (Signed)
Bendamustine Injection °What is this medication? °BENDAMUSTINE (BEN da MUS teen) is a chemotherapy drug. It is used to treat chronic lymphocytic leukemia and non-Hodgkin lymphoma. °This medicine may be used for other purposes; ask your health care provider or pharmacist if you have questions. °COMMON BRAND NAME(S): BELRAPZO, BENDEKA, Treanda °What should I tell my care team before I take this medication? °They need to know if you have any of these conditions: °infection (especially a virus infection such as chickenpox, cold sores, or herpes) °kidney disease °liver disease °an unusual or allergic reaction to bendamustine, mannitol, other medicines, foods, dyes, or preservatives °pregnant or trying to get pregnant °breast-feeding °How should I use this medication? °This medicine is for infusion into a vein. It is given by a health care professional in a hospital or clinic setting. °Talk to your pediatrician regarding the use of this medicine in children. Special care may be needed. °Overdosage: If you think you have taken too much of this medicine contact a poison control center or emergency room at once. °NOTE: This medicine is only for you. Do not share this medicine with others. °What if I miss a dose? °It is important not to miss your dose. Call your doctor or health care professional if you are unable to keep an appointment. °What may interact with this medication? °Do not take this medicine with any of the following medications: °clozapine °This medicine may also interact with the following medications: °atazanavir °cimetidine °ciprofloxacin °enoxacin °fluvoxamine °medicines for seizures like carbamazepine and phenobarbital °mexiletine °rifampin °tacrine °thiabendazole °zileuton °This list may not describe all possible interactions. Give your health care provider a list of all the medicines, herbs, non-prescription drugs, or dietary supplements you use. Also tell them if you smoke, drink alcohol, or use illegal  drugs. Some items may interact with your medicine. °What should I watch for while using this medication? °This drug may make you feel generally unwell. This is not uncommon, as chemotherapy can affect healthy cells as well as cancer cells. Report any side effects. Continue your course of treatment even though you feel ill unless your doctor tells you to stop. °You may need blood work done while you are taking this medicine. °Call your doctor or healthcare provider for advice if you get a fever, chills or sore throat, or other symptoms of a cold or flu. Do not treat yourself. This drug decreases your body's ability to fight infections. Try to avoid being around people who are sick. °This medicine may cause serious skin reactions. They can happen weeks to months after starting the medicine. Contact your healthcare provider right away if you notice fevers or flu-like symptoms with a rash. The rash may be red or purple and then turn into blisters or peeling of the skin. Or, you might notice a red rash with swelling of the face, lips or lymph nodes in your neck or under your arms. °In some patients, this medicine may cause a serious brain infection that may cause death. If you have any problems seeing, thinking, speaking, walking, or standing, tell your health care provider right away. If you cannot reach your health care provider, urgently seek other source of medical care. °This medicine may increase your risk to bruise or bleed. Call your doctor or healthcare provider if you notice any unusual bleeding. °Talk to your doctor about your risk of cancer. You may be more at risk for certain types of cancers if you take this medicine. °This medicine may increase your risk of skin   cancer. Check your skin for changes to moles or for new growths while taking this medicine. Call your health care provider if you notice any of these skin changes. °Do not become pregnant while taking this medicine or for at least 6 months after  stopping it. Women should inform their doctor if they wish to become pregnant or think they might be pregnant. Men should not father a child while taking this medicine and for at least 3 months after stopping it. There is a potential for serious side effects to an unborn child. Talk to your healthcare provider or pharmacist for more information. Do not breast-feed an infant while taking this medicine or for at least 1 week after stopping it. °This medicine may make it more difficult to father a child. You should talk with your doctor or healthcare provider if you are concerned about your fertility. °What side effects may I notice from receiving this medication? °Side effects that you should report to your doctor or health care professional as soon as possible: °allergic reactions like skin rash, itching or hives, swelling of the face, lips, or tongue °low blood counts - this medicine may decrease the number of white blood cells, red blood cells and platelets. You may be at increased risk for infections and bleeding. °rash, fever, and swollen lymph nodes °redness, blistering, peeling, or loosening of the skin, including inside the mouth °signs of infection like fever or chills, cough, sore throat, pain or difficulty passing urine °signs of decreased platelets or bleeding like bruising, pinpoint red spots on the skin, black, tarry stools, blood in the urine °signs of decreased red blood cells like being unusually weak or tired, fainting spells, lightheadedness °signs and symptoms of kidney injury like trouble passing urine or change in the amount of urine °signs and symptoms of liver injury like dark yellow or brown urine; general ill feeling or flu-like symptoms; light-colored stools; loss of appetite; nausea; right upper belly pain; unusually weak or tired; yellowing of the eyes or skin °Side effects that usually do not require medical attention (report to your doctor or health care professional if they continue or  are bothersome): °constipation °decreased appetite °diarrhea °headache °mouth sores °nausea, vomiting °tiredness °This list may not describe all possible side effects. Call your doctor for medical advice about side effects. You may report side effects to FDA at 1-800-FDA-1088. °Where should I keep my medication? °This drug is given in a hospital or clinic and will not be stored at home. °NOTE: This sheet is a summary. It may not cover all possible information. If you have questions about this medicine, talk to your doctor, pharmacist, or health care provider. °© 2022 [employer]/Gold Standard (2019-08-14 00:00:00) ° °

## 2021-01-30 ENCOUNTER — Inpatient Hospital Stay: Payer: Medicare PPO

## 2021-01-30 ENCOUNTER — Other Ambulatory Visit: Payer: Self-pay

## 2021-01-30 VITALS — BP 144/44 | HR 63 | Temp 98.5°F | Resp 18 | Ht 65.0 in | Wt 188.2 lb

## 2021-01-30 DIAGNOSIS — C8309 Small cell B-cell lymphoma, extranodal and solid organ sites: Secondary | ICD-10-CM

## 2021-01-30 DIAGNOSIS — Z5112 Encounter for antineoplastic immunotherapy: Secondary | ICD-10-CM | POA: Diagnosis not present

## 2021-01-30 LAB — MULTIPLE MYELOMA PANEL, SERUM
Albumin SerPl Elph-Mcnc: 3.1 g/dL (ref 2.9–4.4)
Albumin/Glob SerPl: 1.2 (ref 0.7–1.7)
Alpha 1: 0.3 g/dL (ref 0.0–0.4)
Alpha2 Glob SerPl Elph-Mcnc: 0.5 g/dL (ref 0.4–1.0)
B-Globulin SerPl Elph-Mcnc: 0.7 g/dL (ref 0.7–1.3)
Gamma Glob SerPl Elph-Mcnc: 1.2 g/dL (ref 0.4–1.8)
Globulin, Total: 2.6 g/dL (ref 2.2–3.9)
IgA: 10 mg/dL — ABNORMAL LOW (ref 64–422)
IgG (Immunoglobin G), Serum: 1259 mg/dL (ref 586–1602)
IgM (Immunoglobulin M), Srm: 7 mg/dL — ABNORMAL LOW (ref 26–217)
M Protein SerPl Elph-Mcnc: 1 g/dL — ABNORMAL HIGH
Total Protein ELP: 5.7 g/dL — ABNORMAL LOW (ref 6.0–8.5)

## 2021-01-30 MED ORDER — PEGFILGRASTIM-CBQV 6 MG/0.6ML ~~LOC~~ SOSY
6.0000 mg | PREFILLED_SYRINGE | Freq: Once | SUBCUTANEOUS | Status: AC
  Administered 2021-01-30: 6 mg via SUBCUTANEOUS
  Filled 2021-01-30: qty 0.6

## 2021-01-30 NOTE — Patient Instructions (Signed)

## 2021-01-30 NOTE — Progress Notes (Signed)
1345: PT STABLE AT TIME OF DISCHARGE  

## 2021-02-02 ENCOUNTER — Encounter: Payer: Self-pay | Admitting: Oncology

## 2021-02-03 ENCOUNTER — Encounter: Payer: Self-pay | Admitting: Oncology

## 2021-02-05 ENCOUNTER — Other Ambulatory Visit: Payer: Medicare PPO

## 2021-02-05 ENCOUNTER — Ambulatory Visit: Payer: Medicare PPO | Admitting: Oncology

## 2021-02-18 LAB — BASIC METABOLIC PANEL
BUN: 7 (ref 4–21)
CO2: 27 — AB (ref 13–22)
Chloride: 107 (ref 99–108)
Creatinine: 0.7 (ref 0.5–1.1)
Glucose: 129
Potassium: 3.9 (ref 3.4–5.3)
Sodium: 139 (ref 137–147)

## 2021-02-18 LAB — HEPATIC FUNCTION PANEL
ALT: 20 (ref 7–35)
AST: 42 — AB (ref 13–35)
Alkaline Phosphatase: 66 (ref 25–125)
Bilirubin, Total: 1.4

## 2021-02-18 LAB — CBC: RBC: 3.21 — AB (ref 3.87–5.11)

## 2021-02-18 LAB — CBC AND DIFFERENTIAL
HCT: 28 — AB (ref 36–46)
Hemoglobin: 9.4 — AB (ref 12.0–16.0)
Neutrophils Absolute: 1.64
Platelets: 159 (ref 150–399)
WBC: 2.6

## 2021-02-18 LAB — COMPREHENSIVE METABOLIC PANEL
Albumin: 3.9 (ref 3.5–5.0)
Calcium: 8.7 (ref 8.7–10.7)

## 2021-02-19 NOTE — Progress Notes (Cosign Needed)
Alisha Cochran  [address] 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  She presented with positive M-spike, elevated IgG level, and very elevated kappa light chain levels felt to represent multiple myeloma.  However, bone marrow biopsy revealed [name].  Although her scans did not show extensive disease burden, her marrow was heavily inundated with disease, so she is receiving chemotherapy with bendamustine/rituximab.  CT imaging on December 21st revealed resolution of the previously seen axillary adenopathy with stable mild splenomegaly after 3 cycles.  She will proceed with a 4th cycle of bendamustine/rituximab this week.  We will plan to see her back in 4 weeks for clinical reassessment prior to her 5th cycle.   The patient and her husband understand the plans discussed today and are in agreement with them.  They know to contact our office if she develops concerns prior to her next appointment.     Marvia Pickles, PA-C  [facility] AT [facility] [address] [place] [address] Dept: [phone] Dept Fax: [phone]   Orders Placed This Encounter  Procedures   CBC and differential    This external order was created through the Results Console.   CBC    This external order was created through the Results Console.   Basic metabolic panel    This external order was created through the Results Console.   Comprehensive metabolic panel    This external order was created through the Results Console.   Hepatic function panel    This external order was created through the Results Console.   CBC    This order was created through External Result Entry      CHIEF COMPLAINT:  CC: Small B-cell  lymphoma  Current Treatment: Bendamustine/rituximab   HISTORY OF PRESENT ILLNESS:   Oncology History  Non-Hodgkin lymphoma (Alisha Cochran)  11/17/2020 Initial Diagnosis   Non-Hodgkin lymphoma ([place])   12/02/2020 -  Chemotherapy   Patient is on Treatment Plan : NON-HODGKINS LYMPHOMA Rituximab D1 / Bendamustine D1,2 q28d        INTERVAL HISTORY:  Alisha Cochran is here today for repeat clinical assessment prior to a 4th cycle of bendamustine and rituximab.  She states she tolerated her third cycle without significant difficulty.  She had intermittent nausea, for which medication was effective.  She denies continued nausea.  She denies fevers or chills. She denies pain. Her appetite is good, but she has lost weight. Her weight has decreased 8 pounds over last 4 weeks .  Prior to her appointment today she underwent repeat CT chest, abdomen and pelvis.  REVIEW OF SYSTEMS:  Review of Systems  Constitutional:  Negative for appetite change, chills, fatigue, fever and unexpected weight change.  HENT:   Negative for lump/mass, mouth sores and sore throat.   Respiratory:  Negative for cough and shortness of breath.   Cardiovascular:  Negative for chest pain and leg swelling.  Gastrointestinal:  Negative for abdominal pain, constipation, diarrhea, nausea and vomiting.  Endocrine: Negative for hot flashes.  Genitourinary:  Negative for difficulty urinating, dysuria, frequency and hematuria.   Musculoskeletal:  Negative for arthralgias, back pain and myalgias.  Skin:  Negative for rash.  Neurological:  Negative for dizziness and headaches.  Hematological:  Negative for adenopathy. Does not  bruise/bleed easily.  Psychiatric/Behavioral:  Negative for depression and sleep disturbance. The patient is not nervous/anxious.     VITALS:  Blood pressure (!) 213/82, pulse 71, temperature 98.2 F (36.8 C), resp. rate 16, height '5\' 5"'  (1.651 m), weight 180 lb 11.2 oz (82 kg), SpO2 97 %.  Wt Readings from Last 3 Encounters:   02/24/21 180 lb 11.2 oz (82 kg)  01/30/21 188 lb 4 oz (85.4 kg)  01/29/21 189 lb 0.8 oz (85.8 kg)    Body mass index is 30.07 kg/m.  Performance status (ECOG): 0 - Asymptomatic  PHYSICAL EXAM:  Physical Exam Vitals and nursing note reviewed.  Constitutional:      General: She is not in acute distress.    Appearance: Normal appearance.  HENT:     Head: Normocephalic and atraumatic.     Mouth/Throat:     Mouth: Mucous membranes are moist.     Pharynx: Oropharynx is clear. No oropharyngeal exudate or posterior oropharyngeal erythema.  Eyes:     General: No scleral icterus.    Extraocular Movements: Extraocular movements intact.     Conjunctiva/sclera: Conjunctivae normal.     Pupils: Pupils are equal, round, and reactive to light.  Cardiovascular:     Rate and Rhythm: Normal rate and regular rhythm.     Heart sounds: Normal heart sounds. No murmur heard.   No friction rub. No gallop.  Pulmonary:     Effort: Pulmonary effort is normal.     Breath sounds: Normal breath sounds. No wheezing, rhonchi or rales.  Abdominal:     General: There is no distension.     Palpations: Abdomen is soft. There is no hepatomegaly, splenomegaly or mass.     Tenderness: There is no abdominal tenderness.  Musculoskeletal:        General: Normal range of motion.     Cervical back: Normal range of motion and neck supple. No tenderness.     Right lower leg: No edema.     Left lower leg: No edema.  Lymphadenopathy:     Cervical: No cervical adenopathy.     Upper Body:     Right upper body: No supraclavicular or axillary adenopathy.     Left upper body: No supraclavicular or axillary adenopathy.     Lower Body: No right inguinal adenopathy. No left inguinal adenopathy.  Skin:    General: Skin is warm and dry.     Coloration: Skin is not jaundiced.     Findings: No rash.  Neurological:     Mental Status: She is alert and oriented to person, place, and time.     Cranial Nerves: No cranial nerve  deficit.  Psychiatric:        Mood and Affect: Mood normal.        Behavior: Behavior normal.        Thought Content: Thought content normal.    LABS:   CBC Latest Ref Rng & Units 02/18/2021 01/26/2021 12/26/2020  WBC - 2.6 1.8 2.4  Hemoglobin 12.0 - 16.0 9.4(A) 8.4(A) 9.0(A)  Hematocrit 36 - 46 28(A) 26(A) 27(A)  Platelets 150 - 399 159 137(A) 132(A)   CMP Latest Ref Rng & Units 02/18/2021 01/26/2021 12/26/2020  Glucose 65 - 99 mg/dL - - -  BUN 4 - '21 7 6 11  ' Creatinine 0.5 - 1.1 0.7 0.7 0.9  Sodium 137 - 147 139 141 140  Potassium 3.4 - 5.3 3.9 3.1(A) 4.0  Chloride 99 - 108 107 108 108  CO2 13 - 22 27(A) 29(A) 25(A)  Calcium 8.7 - 10.7 8.7 8.2(A) 8.4(A)  Alkaline Phos 25 - 125 66 62 79  AST 13 - 35 42(A) 36(A) 33  ALT 7 - 35 '20 22 20     ' No results found for: CEA1 / No results found for: CEA1 No results found for: PSA1 No results found for: AQT622 No results found for: QJF354  Lab Results  Component Value Date   TOTALPROTELP 5.7 (L) 01/26/2021   ALBUMINELP 3.6 10/06/2020   A1GS 0.3 10/06/2020   A2GS 0.8 10/06/2020   BETS 0.9 10/06/2020   GAMS 2.8 (H) 10/06/2020   MSPIKE 2.5 (H) 10/06/2020   SPEI Comment 10/06/2020   Lab Results  Component Value Date   TIBC 297 10/06/2020   FERRITIN 222 10/06/2020   IRONPCTSAT 28 10/06/2020   Lab Results  Component Value Date   LDH 177 12/12/2020   LDH 131 11/25/2020    STUDIES:  No results found.    Exam(s): [id number] CT/CT CHEST-ABD-PELV W/IV CM  CLINICAL DATA: B-cell lymphoma restaging  EXAM:  CT CHEST, ABDOMEN, AND PELVIS WITH CONTRAST  TECHNIQUE:  Multidetector CT imaging of the chest, abdomen and pelvis was  performed following the standard protocol during bolus  administration of intravenous contrast.  CONTRAST: 100 cc Isovue 370  COMPARISON: 11/24/2020  FINDINGS:   CT CHEST FINDINGS  Cardiovascular: Right Port-A-Cath tip: Cavoatrial junction.  Coronary, aortic arch, and branch vessel atherosclerotic  vascular  disease.  Mediastinum/Nodes: Prior axillary adenopathy has resolved. For  example the previously 1.0 cm in short axis right axillary lymph  node currently measures 0.3 cm in short axis on image 13 series 2,  and the previous 1.1 cm in short axis left axillary lymph node  currently measures 0.6 cm in short axis on image 19 series 2.  Lungs/Pleura: Stable peribronchovascular nodule or lymph node in the  right lower lobe measuring 4 by 6 by 7 mm (volume = 90 mm^3) on  image 65 series 4.  Musculoskeletal: Thoracic spondylosis.   CT ABDOMEN PELVIS FINDINGS  Hepatobiliary: 1.0 by 0.8 cm hypodense lesion in segment 4b of the  liver on image 51 series 2, unchanged. Hypodense 0.8 by 0.6 cm  lesion in segment 5 of the liver on image 49 series 2, likewise  unchanged. These lesions may well represent cysts but are  technically too small to characterize. 3 mm hypodense lesion in the  right hepatic lobe on image 39 series 2. Nonvisualization of the  gallbladder, presumed cholecystectomy. No significant biliary  dilatation.  Pancreas: Unremarkable  Spleen: The spleen measures 11.0 by 7.0 by 11.7 cm (volume = 470  cm^3), compatible with mild splenomegaly. No focal splenic lesion  observed.  Adrenals/Urinary Tract: Unremarkable  Stomach/Bowel: Descending and sigmoid colon diverticulosis without  findings of active diverticulitis.  Vascular/Lymphatic: Atherosclerosis is present, including aortoiliac  atherosclerotic disease. No pathologic adenopathy in the abdomen or  pelvis.  Reproductive: Uterus absent. Adnexa unremarkable.  Other: No supplemental non-categorized findings.  Musculoskeletal: Lumbar spondylosis and degenerative disc disease.   IMPRESSION:  1. Interval resolution of prior axillary adenopathy. No current  pathologic adenopathy observed in the chest, abdomen, or pelvis.  2. Stable mild splenomegaly.  3. Stable 90 cubic mm right lower lobe peribronchovascular nodule or   lymph node. Flash near criteria for follow do not apply given the  patient's history of malignancy. Consider surveillance in 6-12  months time by chest CT.  4. Other imaging findings of potential  clinical significance:  Substantial atherosclerosis. Aortic Atherosclerosis (ICD10-I70.0).  Coronary atherosclerosis.  5. Three small hypodense lesions in the liver are unchanged and  technically too small to characterize although statistically likely  to be benign.  6. Descending and sigmoid colon diverticulosis.   HISTORY:   Past Medical History:  Diagnosis Date   Acute pain of right knee 09/04/2019   Formatting of this note might be different from the original. 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 2014: MRI DDD L4/5   Diverticulosis of colon 09/15/2015   2014, 2020: diverticulitis   Dyspnea on exertion 10/25/2018   2020   Essential hypertension 05/08/2019   Family history of premature coronary artery disease 04/12/2018   Fibrocystic breast disease 09/15/2015   Herpes zoster without complication 51/04/5850   2020: left T12   Hyperlipemia, mixed 05/18/2015   Insomnia 05/21/2015   Lumbar radiculopathy 09/25/2012   Overview:  2008: onset 2009: MRI DDD with tear and protrusion L5-S1, facet arthropathy L5-S1 2014: MRI L4-5 DDD with progression of L5-S1 changes 2014: Pain eval, limited  Formatting of this note might be different from the original. 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 2008: -0.7 2016: -1.6 2019: -1.5  Formatting of this note might be different from the original. 2008: -0.7 2016: -1.6 2019: -1.5 2021: -1.6   Stenosis of left carotid artery 06/22/2019   Formatting of this note might be different from the original. 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   Wellness examination 09/15/2015    Past Surgical History:  Procedure Laterality Date   BREAST EXCISIONAL BIOPSY Right    x2   BREAST EXCISIONAL BIOPSY Left    x2   CHOLECYSTECTOMY     OVARIAN CYST SURGERY     ROTATOR CUFF REPAIR Right 2008   ROTATOR CUFF REPAIR Left 2006   TONSILLECTOMY     VAGINAL HYSTERECTOMY  1987    Family History  Problem Relation Age of Onset   Breast cancer Mother    Breast cancer Maternal Grandmother    Breast cancer Paternal Grandmother    Heart attack Paternal Grandmother    Heart disease Father    Heart attack Paternal Grandfather     Social History:  reports that she quit smoking about 43 years ago. Her  smoking use included cigarettes. She started smoking about 57 years ago. She has never used smokeless tobacco. She reports current alcohol use of about 14.0 standard drinks per week. She reports that she does not use drugs.The patient is accompanied by her husband today.  Allergies:  Allergies  Allergen  Reactions   Lisinopril Cough    Current Medications: Current Outpatient Medications  Medication Sig Dispense Refill   calcium-vitamin D (OSCAL WITH D) 500-200 MG-UNIT TABS tablet Take 1 tablet by mouth daily.     cyanocobalamin (DODEX) 1000 MCG/ML injection      doxazosin (CARDURA) 2 MG tablet Take 2 mg by mouth every evening.     furosemide (LASIX) 40 MG tablet TAKE 1 TABLET(40 MG) BY MOUTH DAILY 30 tablet 3   LORazepam (ATIVAN) 1 MG tablet Take 1 tablet (1 mg total) by mouth every 8 (eight) hours. 30 tablet 0   losartan (COZAAR) 100 MG tablet TAKE 1 TABLET(100 MG) BY MOUTH DAILY 90 tablet 2   metoprolol tartrate (LOPRESSOR) 50 MG tablet Take 50 mg by mouth every 12 (twelve) hours.     ondansetron (ZOFRAN) 4 MG tablet Take 1 tablet (4 mg total) by mouth every 8 (eight) hours as needed for nausea or vomiting. 20 tablet 0   ondansetron (ZOFRAN) 4 MG tablet Take 1 tablet (4 mg total) by mouth every 4 (four) hours as needed for nausea. 90 tablet 3   potassium chloride SA (KLOR-CON M) 20 MEQ tablet Take 1 tablet (20 mEq total) by mouth daily. 5 tablet 3   predniSONE (DELTASONE) 20 MG tablet Take 1 tablet (20 mg total) by mouth daily with breakfast. TAKE FOR APPETITE STIMULATION 21 tablet 0   pregabalin (LYRICA) 75 MG capsule Take 75 mg by mouth at bedtime.     prochlorperazine (COMPAZINE) 10 MG tablet Take 1 tablet (10 mg total) by mouth every 6 (six) hours as needed for nausea or vomiting. 90 tablet 3   rosuvastatin (CRESTOR) 40 MG tablet Take 40 mg by mouth daily.     zolpidem (AMBIEN) 10 MG tablet TAKE 1/2 TABLET(5 MG) BY MOUTH EVERY NIGHT AS NEEDED FOR SLEEP     No current facility-administered medications for this visit.

## 2021-02-19 NOTE — Assessment & Plan Note (Addendum)
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  She presented with positive M-spike, elevated IgG level, and very elevated kappa light chain levels felt to represent multiple myeloma.  However, bone marrow biopsy revealed [name].  Although her scans did not show extensive disease burden, her marrow was heavily inundated with disease, so she is receiving chemotherapy with bendamustine/rituximab.  CT imaging on December 21st revealed resolution of the previously seen axillary adenopathy with stable mild splenomegaly after 3 cycles.  She will proceed with a 4th cycle of bendamustine/rituximab this week.  We will plan to see her back in 4 weeks for clinical reassessment prior to her 5th cycle.

## 2021-02-24 ENCOUNTER — Inpatient Hospital Stay: Payer: Medicare PPO | Admitting: Hematology and Oncology

## 2021-02-24 ENCOUNTER — Encounter: Payer: Self-pay | Admitting: Hematology and Oncology

## 2021-02-24 DIAGNOSIS — C8304 Small cell B-cell lymphoma, lymph nodes of axilla and upper limb: Secondary | ICD-10-CM

## 2021-02-24 LAB — CBC: MCV: 86 (ref 81–99)

## 2021-02-25 ENCOUNTER — Other Ambulatory Visit: Payer: Self-pay

## 2021-02-25 ENCOUNTER — Inpatient Hospital Stay: Payer: Medicare PPO

## 2021-02-25 ENCOUNTER — Encounter: Payer: Self-pay | Admitting: Oncology

## 2021-02-25 VITALS — BP 180/72 | HR 59 | Temp 98.1°F | Resp 18 | Ht 65.0 in | Wt 185.0 lb

## 2021-02-25 DIAGNOSIS — C8309 Small cell B-cell lymphoma, extranodal and solid organ sites: Secondary | ICD-10-CM

## 2021-02-25 DIAGNOSIS — Z5112 Encounter for antineoplastic immunotherapy: Secondary | ICD-10-CM | POA: Diagnosis not present

## 2021-02-25 DIAGNOSIS — D6481 Anemia due to antineoplastic chemotherapy: Secondary | ICD-10-CM

## 2021-02-25 DIAGNOSIS — D508 Other iron deficiency anemias: Secondary | ICD-10-CM

## 2021-02-25 MED ORDER — SODIUM CHLORIDE 0.9 % IV SOLN
375.0000 mg/m2 | Freq: Once | INTRAVENOUS | Status: AC
  Administered 2021-02-25: 700 mg via INTRAVENOUS
  Filled 2021-02-25: qty 50

## 2021-02-25 MED ORDER — SODIUM CHLORIDE 0.9 % IV SOLN
72.0000 mg/m2 | Freq: Once | INTRAVENOUS | Status: AC
  Administered 2021-02-25: 150 mg via INTRAVENOUS
  Filled 2021-02-25: qty 6

## 2021-02-25 MED ORDER — ACETAMINOPHEN 325 MG PO TABS
650.0000 mg | ORAL_TABLET | Freq: Once | ORAL | Status: AC
  Administered 2021-02-25: 650 mg via ORAL
  Filled 2021-02-25: qty 2

## 2021-02-25 MED ORDER — HEPARIN SOD (PORK) LOCK FLUSH 100 UNIT/ML IV SOLN
500.0000 [IU] | Freq: Once | INTRAVENOUS | Status: AC | PRN
  Administered 2021-02-25: 500 [IU]

## 2021-02-25 MED ORDER — PALONOSETRON HCL INJECTION 0.25 MG/5ML
0.2500 mg | Freq: Once | INTRAVENOUS | Status: AC
  Administered 2021-02-25: 0.25 mg via INTRAVENOUS
  Filled 2021-02-25: qty 5

## 2021-02-25 MED ORDER — EPOETIN ALFA-EPBX 40000 UNIT/ML IJ SOLN
40000.0000 [IU] | Freq: Once | INTRAMUSCULAR | Status: AC
  Administered 2021-02-25: 40000 [IU] via SUBCUTANEOUS
  Filled 2021-02-25: qty 1

## 2021-02-25 MED ORDER — DIPHENHYDRAMINE HCL 25 MG PO CAPS
50.0000 mg | ORAL_CAPSULE | Freq: Once | ORAL | Status: AC
  Administered 2021-02-25: 50 mg via ORAL
  Filled 2021-02-25: qty 2

## 2021-02-25 MED ORDER — SODIUM CHLORIDE 0.9% FLUSH
10.0000 mL | INTRAVENOUS | Status: DC | PRN
  Administered 2021-02-25: 10 mL

## 2021-02-25 MED ORDER — SODIUM CHLORIDE 0.9 % IV SOLN: Freq: Once | INTRAVENOUS | Status: AC

## 2021-02-25 MED ORDER — SODIUM CHLORIDE 0.9 % IV SOLN
10.0000 mg | Freq: Once | INTRAVENOUS | Status: AC
  Administered 2021-02-25: 10 mg via INTRAVENOUS
  Filled 2021-02-25: qty 10

## 2021-02-25 NOTE — Patient Instructions (Signed)
Rituximab; Hyaluronidase injection What is this medication? RITUXIMAB; HYALURONIDASE (ri TUX i mab / hye al ur ON i dase) is used to treat non-Hodgkin lymphoma and chronic lymphocytic leukemia. Rituximab is a monoclonal antibody. Hyaluronidase is used to improve the effects of rituximab. This medicine may be used for other purposes; ask your health care provider or pharmacist if you have questions. COMMON BRAND NAME(S): Rituxan Hycela What should I tell my care team before I take this medication? They need to know if you have any of these conditions: heart disease infection (especially a virus infection such as hepatitis B, chickenpox, cold sores, or herpes) immune system problems irregular heartbeat kidney disease lung or breathing disease, like asthma recently received or scheduled to receive a vaccine an unusual or allergic reaction to rituximab, rituximab/hyaluronidase, mouse proteins, other medicines, foods, dyes, or preservatives pregnant or trying to get pregnant breast-feeding How should I use this medication? This medicine is for injection under the skin. It is given by a health care professional in a hospital or clinic setting. A special MedGuide will be given to you before each treatment. Be sure to read this information carefully each time. Talk to your pediatrician regarding the use of this medicine in children. Special care may be needed. Overdosage: If you think you have taken too much of this medicine contact a poison control center or emergency room at once. NOTE: This medicine is only for you. Do not share this medicine with others. What if I miss a dose? It is important not to miss your dose. Call your doctor or health care professional if you are unable to keep an appointment. What may interact with this medication? This medicine may interact with the following medications: cisplatin live virus vaccines This list may not describe all possible interactions. Give your  health care provider a list of all the medicines, herbs, non-prescription drugs, or dietary supplements you use. Also tell them if you smoke, drink alcohol, or use illegal drugs. Some items may interact with your medicine. What should I watch for while using this medication? Your condition will be monitored carefully while you are receiving this medicine. You may need blood work done while you are taking this medicine. This medicine can cause serious allergic reactions. To reduce your risk you may need to take medicine before treatment with this medicine. Take your medicine as directed. In some patients, this medicine may cause a serious brain infection that may cause death. If you have any problems seeing, thinking, speaking, walking, or standing, tell your doctor right away. If you cannot reach your doctor, urgently seek other source of medical care. Call your doctor or health care professional for advice if you get a fever, chills or sore throat, or other symptoms of a cold or flu. Do not treat yourself. This drug decreases your body's ability to fight infections. Try to avoid being around people who are sick. Do not become pregnant while taking this medicine or for 12 months after stopping it. Women should inform their doctor if they wish to become pregnant or think they might be pregnant. There is a potential for serious side effects to an unborn child. Talk to your health care professional or pharmacist for more information. Do not breast-feed an infant while taking this medicine or for at least 6 months after stopping it. What side effects may I notice from receiving this medication? Side effects that you should report to your doctor or health care professional as soon as possible: allergic reactions  like skin rash, itching or hives; swelling of the face, lips, or tongue breathing problems chest pain changes in vision diarrhea dizziness headache with fever, neck stiffness, sensitivity to light,  nausea, or confusion fast, irregular heartbeat loss of memory low blood counts - this medicine may decrease the number of white blood cells, red blood cells and platelets. You may be at increased risk for infections and bleeding. mouth sores problems with balance, talking, or walking redness, blistering, peeling or loosening of the skin, including inside the mouth signs of infection - fever or chills, cough, sore throat, pain or difficulty passing urine signs and symptoms of kidney injury like trouble passing urine or change in the amount of urine signs and symptoms of liver injury like dark yellow or brown urine; general ill feeling or flu-like symptoms; light-colored stools; loss of appetite; nausea; right upper belly pain; unusually weak or tired; yellowing of the eyes or skin stomach pain swelling of the ankles, feet, hands unusual bleeding or bruising vomiting Side effects that usually do not require medical attention (report these to your doctor or health care professional if they continue or are bothersome): headache joint pain muscle cramps or muscle pain nausea pain, redness, or irritation at site where injected tiredness This list may not describe all possible side effects. Call your doctor for medical advice about side effects. You may report side effects to FDA at 1-800-FDA-1088. Where should I keep my medication? This drug is given in a hospital or clinic and will not be stored at home. NOTE: This sheet is a summary. It may not cover all possible information. If you have questions about this medicine, talk to your doctor, pharmacist, or health care provider.  2022 Elsevier/Gold Standard (2017-01-28 00:00:00) Bendamustine Injection What is this medication? BENDAMUSTINE (BEN da MUS teen) is a chemotherapy drug. It is used to treat chronic lymphocytic leukemia and non-Hodgkin lymphoma. This medicine may be used for other purposes; ask your health care provider or pharmacist if  you have questions. COMMON BRAND NAME(S): Kristine Royal, Treanda What should I tell my care team before I take this medication? They need to know if you have any of these conditions: infection (especially a virus infection such as chickenpox, cold sores, or herpes) kidney disease liver disease an unusual or allergic reaction to bendamustine, mannitol, other medicines, foods, dyes, or preservatives pregnant or trying to get pregnant breast-feeding How should I use this medication? This medicine is for infusion into a vein. It is given by a health care professional in a hospital or clinic setting. Talk to your pediatrician regarding the use of this medicine in children. Special care may be needed. Overdosage: If you think you have taken too much of this medicine contact a poison control center or emergency room at once. NOTE: This medicine is only for you. Do not share this medicine with others. What if I miss a dose? It is important not to miss your dose. Call your doctor or health care professional if you are unable to keep an appointment. What may interact with this medication? Do not take this medicine with any of the following medications: clozapine This medicine may also interact with the following medications: atazanavir cimetidine ciprofloxacin enoxacin fluvoxamine medicines for seizures like carbamazepine and phenobarbital mexiletine rifampin tacrine thiabendazole zileuton This list may not describe all possible interactions. Give your health care provider a list of all the medicines, herbs, non-prescription drugs, or dietary supplements you use. Also tell them if you smoke, drink alcohol, or  use illegal drugs. Some items may interact with your medicine. What should I watch for while using this medication? This drug may make you feel generally unwell. This is not uncommon, as chemotherapy can affect healthy cells as well as cancer cells. Report any side effects. Continue  your course of treatment even though you feel ill unless your doctor tells you to stop. You may need blood work done while you are taking this medicine. Call your doctor or healthcare provider for advice if you get a fever, chills or sore throat, or other symptoms of a cold or flu. Do not treat yourself. This drug decreases your body's ability to fight infections. Try to avoid being around people who are sick. This medicine may cause serious skin reactions. They can happen weeks to months after starting the medicine. Contact your healthcare provider right away if you notice fevers or flu-like symptoms with a rash. The rash may be red or purple and then turn into blisters or peeling of the skin. Or, you might notice a red rash with swelling of the face, lips or lymph nodes in your neck or under your arms. In some patients, this medicine may cause a serious brain infection that may cause death. If you have any problems seeing, thinking, speaking, walking, or standing, tell your health care provider right away. If you cannot reach your health care provider, urgently seek other source of medical care. This medicine may increase your risk to bruise or bleed. Call your doctor or healthcare provider if you notice any unusual bleeding. Talk to your doctor about your risk of cancer. You may be more at risk for certain types of cancers if you take this medicine. This medicine may increase your risk of skin cancer. Check your skin for changes to moles or for new growths while taking this medicine. Call your health care provider if you notice any of these skin changes. Do not become pregnant while taking this medicine or for at least 6 months after stopping it. Women should inform their doctor if they wish to become pregnant or think they might be pregnant. Men should not father a child while taking this medicine and for at least 3 months after stopping it. There is a potential for serious side effects to an unborn  child. Talk to your healthcare provider or pharmacist for more information. Do not breast-feed an infant while taking this medicine or for at least 1 week after stopping it. This medicine may make it more difficult to father a child. You should talk with your doctor or healthcare provider if you are concerned about your fertility. What side effects may I notice from receiving this medication? Side effects that you should report to your doctor or health care professional as soon as possible: allergic reactions like skin rash, itching or hives, swelling of the face, lips, or tongue low blood counts - this medicine may decrease the number of white blood cells, red blood cells and platelets. You may be at increased risk for infections and bleeding. rash, fever, and swollen lymph nodes redness, blistering, peeling, or loosening of the skin, including inside the mouth signs of infection like fever or chills, cough, sore throat, pain or difficulty passing urine signs of decreased platelets or bleeding like bruising, pinpoint red spots on the skin, black, tarry stools, blood in the urine signs of decreased red blood cells like being unusually weak or tired, fainting spells, lightheadedness signs and symptoms of kidney injury like trouble passing urine or  change in the amount of urine signs and symptoms of liver injury like dark yellow or brown urine; general ill feeling or flu-like symptoms; light-colored stools; loss of appetite; nausea; right upper belly pain; unusually weak or tired; yellowing of the eyes or skin Side effects that usually do not require medical attention (report to your doctor or health care professional if they continue or are bothersome): constipation decreased appetite diarrhea headache mouth sores nausea, vomiting tiredness This list may not describe all possible side effects. Call your doctor for medical advice about side effects. You may report side effects to FDA at  1-800-FDA-1088. Where should I keep my medication? This drug is given in a hospital or clinic and will not be stored at home. NOTE: This sheet is a summary. It may not cover all possible information. If you have questions about this medicine, talk to your doctor, pharmacist, or health care provider.  2022 Elsevier/Gold Standard (2019-08-14 00:00:00)

## 2021-02-26 ENCOUNTER — Inpatient Hospital Stay: Payer: Medicare PPO

## 2021-02-26 VITALS — BP 133/76 | HR 70 | Temp 97.4°F | Resp 18 | Ht 65.0 in | Wt 185.0 lb

## 2021-02-26 DIAGNOSIS — C8309 Small cell B-cell lymphoma, extranodal and solid organ sites: Secondary | ICD-10-CM

## 2021-02-26 DIAGNOSIS — Z5112 Encounter for antineoplastic immunotherapy: Secondary | ICD-10-CM | POA: Diagnosis not present

## 2021-02-26 MED ORDER — SODIUM CHLORIDE 0.9% FLUSH
10.0000 mL | INTRAVENOUS | Status: DC | PRN
  Administered 2021-02-26: 10 mL

## 2021-02-26 MED ORDER — HEPARIN SOD (PORK) LOCK FLUSH 100 UNIT/ML IV SOLN
500.0000 [IU] | Freq: Once | INTRAVENOUS | Status: AC | PRN
  Administered 2021-02-26: 500 [IU]

## 2021-02-26 MED ORDER — SODIUM CHLORIDE 0.9 % IV SOLN: Freq: Once | INTRAVENOUS | Status: AC

## 2021-02-26 MED ORDER — SODIUM CHLORIDE 0.9 % IV SOLN
72.0000 mg/m2 | Freq: Once | INTRAVENOUS | Status: AC
  Administered 2021-02-26: 150 mg via INTRAVENOUS
  Filled 2021-02-26: qty 6

## 2021-02-26 MED ORDER — SODIUM CHLORIDE 0.9 % IV SOLN
10.0000 mg | Freq: Once | INTRAVENOUS | Status: AC
  Administered 2021-02-26: 10 mg via INTRAVENOUS
  Filled 2021-02-26: qty 10

## 2021-02-26 NOTE — Patient Instructions (Signed)
Bendamustine Injection °What is this medication? °BENDAMUSTINE (BEN da MUS teen) is a chemotherapy drug. It is used to treat chronic lymphocytic leukemia and non-Hodgkin lymphoma. °This medicine may be used for other purposes; ask your health care provider or pharmacist if you have questions. °COMMON BRAND NAME(S): BELRAPZO, BENDEKA, Treanda °What should I tell my care team before I take this medication? °They need to know if you have any of these conditions: °infection (especially a virus infection such as chickenpox, cold sores, or herpes) °kidney disease °liver disease °an unusual or allergic reaction to bendamustine, mannitol, other medicines, foods, dyes, or preservatives °pregnant or trying to get pregnant °breast-feeding °How should I use this medication? °This medicine is for infusion into a vein. It is given by a health care professional in a hospital or clinic setting. °Talk to your pediatrician regarding the use of this medicine in children. Special care may be needed. °Overdosage: If you think you have taken too much of this medicine contact a poison control center or emergency room at once. °NOTE: This medicine is only for you. Do not share this medicine with others. °What if I miss a dose? °It is important not to miss your dose. Call your doctor or health care professional if you are unable to keep an appointment. °What may interact with this medication? °Do not take this medicine with any of the following medications: °clozapine °This medicine may also interact with the following medications: °atazanavir °cimetidine °ciprofloxacin °enoxacin °fluvoxamine °medicines for seizures like carbamazepine and phenobarbital °mexiletine °rifampin °tacrine °thiabendazole °zileuton °This list may not describe all possible interactions. Give your health care provider a list of all the medicines, herbs, non-prescription drugs, or dietary supplements you use. Also tell them if you smoke, drink alcohol, or use illegal  drugs. Some items may interact with your medicine. °What should I watch for while using this medication? °This drug may make you feel generally unwell. This is not uncommon, as chemotherapy can affect healthy cells as well as cancer cells. Report any side effects. Continue your course of treatment even though you feel ill unless your doctor tells you to stop. °You may need blood work done while you are taking this medicine. °Call your doctor or healthcare provider for advice if you get a fever, chills or sore throat, or other symptoms of a cold or flu. Do not treat yourself. This drug decreases your body's ability to fight infections. Try to avoid being around people who are sick. °This medicine may cause serious skin reactions. They can happen weeks to months after starting the medicine. Contact your healthcare provider right away if you notice fevers or flu-like symptoms with a rash. The rash may be red or purple and then turn into blisters or peeling of the skin. Or, you might notice a red rash with swelling of the face, lips or lymph nodes in your neck or under your arms. °In some patients, this medicine may cause a serious brain infection that may cause death. If you have any problems seeing, thinking, speaking, walking, or standing, tell your health care provider right away. If you cannot reach your health care provider, urgently seek other source of medical care. °This medicine may increase your risk to bruise or bleed. Call your doctor or healthcare provider if you notice any unusual bleeding. °Talk to your doctor about your risk of cancer. You may be more at risk for certain types of cancers if you take this medicine. °This medicine may increase your risk of skin   cancer. Check your skin for changes to moles or for new growths while taking this medicine. Call your health care provider if you notice any of these skin changes. °Do not become pregnant while taking this medicine or for at least 6 months after  stopping it. Women should inform their doctor if they wish to become pregnant or think they might be pregnant. Men should not father a child while taking this medicine and for at least 3 months after stopping it. There is a potential for serious side effects to an unborn child. Talk to your healthcare provider or pharmacist for more information. Do not breast-feed an infant while taking this medicine or for at least 1 week after stopping it. °This medicine may make it more difficult to father a child. You should talk with your doctor or healthcare provider if you are concerned about your fertility. °What side effects may I notice from receiving this medication? °Side effects that you should report to your doctor or health care professional as soon as possible: °allergic reactions like skin rash, itching or hives, swelling of the face, lips, or tongue °low blood counts - this medicine may decrease the number of white blood cells, red blood cells and platelets. You may be at increased risk for infections and bleeding. °rash, fever, and swollen lymph nodes °redness, blistering, peeling, or loosening of the skin, including inside the mouth °signs of infection like fever or chills, cough, sore throat, pain or difficulty passing urine °signs of decreased platelets or bleeding like bruising, pinpoint red spots on the skin, black, tarry stools, blood in the urine °signs of decreased red blood cells like being unusually weak or tired, fainting spells, lightheadedness °signs and symptoms of kidney injury like trouble passing urine or change in the amount of urine °signs and symptoms of liver injury like dark yellow or brown urine; general ill feeling or flu-like symptoms; light-colored stools; loss of appetite; nausea; right upper belly pain; unusually weak or tired; yellowing of the eyes or skin °Side effects that usually do not require medical attention (report to your doctor or health care professional if they continue or  are bothersome): °constipation °decreased appetite °diarrhea °headache °mouth sores °nausea, vomiting °tiredness °This list may not describe all possible side effects. Call your doctor for medical advice about side effects. You may report side effects to FDA at 1-800-FDA-1088. °Where should I keep my medication? °This drug is given in a hospital or clinic and will not be stored at home. °NOTE: This sheet is a summary. It may not cover all possible information. If you have questions about this medicine, talk to your doctor, pharmacist, or health care provider. °© 2022 [employer]/Gold Standard (2019-08-14 00:00:00) ° °

## 2021-02-27 ENCOUNTER — Other Ambulatory Visit: Payer: Self-pay

## 2021-02-27 ENCOUNTER — Other Ambulatory Visit: Payer: Self-pay | Admitting: Pharmacist

## 2021-02-27 ENCOUNTER — Inpatient Hospital Stay: Payer: Medicare PPO

## 2021-02-27 VITALS — BP 147/58 | HR 76 | Temp 97.8°F | Resp 18 | Ht 65.0 in | Wt 187.0 lb

## 2021-02-27 DIAGNOSIS — C8309 Small cell B-cell lymphoma, extranodal and solid organ sites: Secondary | ICD-10-CM

## 2021-02-27 DIAGNOSIS — Z5112 Encounter for antineoplastic immunotherapy: Secondary | ICD-10-CM | POA: Diagnosis not present

## 2021-02-27 MED ORDER — PEGFILGRASTIM-CBQV 6 MG/0.6ML ~~LOC~~ SOSY
6.0000 mg | PREFILLED_SYRINGE | Freq: Once | SUBCUTANEOUS | Status: AC
  Administered 2021-02-27: 6 mg via SUBCUTANEOUS
  Filled 2021-02-27: qty 0.6

## 2021-02-27 NOTE — Patient Instructions (Signed)

## 2021-02-27 NOTE — Progress Notes (Signed)
1422: PT STABLE AT TIME OF DISCHARGE  

## 2021-03-05 ENCOUNTER — Encounter: Payer: Self-pay | Admitting: Oncology

## 2021-03-11 ENCOUNTER — Other Ambulatory Visit: Payer: Self-pay

## 2021-03-16 ENCOUNTER — Ambulatory Visit: Payer: Medicare PPO | Admitting: Cardiology

## 2021-03-16 ENCOUNTER — Encounter: Payer: Self-pay | Admitting: Cardiology

## 2021-03-16 ENCOUNTER — Other Ambulatory Visit: Payer: Self-pay

## 2021-03-16 VITALS — BP 162/60 | HR 80 | Ht 65.0 in | Wt 172.8 lb

## 2021-03-16 DIAGNOSIS — I7090 Unspecified atherosclerosis: Secondary | ICD-10-CM

## 2021-03-16 DIAGNOSIS — E782 Mixed hyperlipidemia: Secondary | ICD-10-CM | POA: Insufficient documentation

## 2021-03-16 DIAGNOSIS — I10 Essential (primary) hypertension: Secondary | ICD-10-CM | POA: Diagnosis not present

## 2021-03-16 HISTORY — DX: Unspecified atherosclerosis: I70.90

## 2021-03-16 HISTORY — DX: Mixed hyperlipidemia: E78.2

## 2021-03-16 MED ORDER — AMLODIPINE BESYLATE 5 MG PO TABS: 2.5000 mg | ORAL_TABLET | Freq: Every day | ORAL | 3 refills | Status: DC

## 2021-03-16 NOTE — Patient Instructions (Signed)
Medication Instructions:  Your physician has recommended you make the following change in your medication:   Start Amlodipine 5 mg take 1/2 tablet daily.  *If you need a refill on your cardiac medications before your next appointment, please call your pharmacy*   Lab Work: None ordered If you have labs (blood work) drawn today and your tests are completely normal, you will receive your results only by: [place] (if you have MyChart) OR A paper copy in the mail If you have any lab test that is abnormal or we need to change your treatment, we will call you to review the results.   Testing/Procedures: None ordered   Follow-Up: At [facility], you and your health needs are our priority.  As part of our continuing mission to provide you with exceptional heart care, we have created designated Provider Care Teams.  These Care Teams include your primary Cardiologist (physician) and Advanced Practice Providers (APPs -  Physician Assistants and Nurse Practitioners) who all work together to provide you with the care you need, when you need it.  We recommend signing up for the patient portal called "MyChart".  Sign up information is provided on this After Visit Summary.  MyChart is used to connect with patients for Virtual Visits (Telemedicine).  Patients are able to view lab/test results, encounter notes, upcoming appointments, etc.  Non-urgent messages can be sent to your provider as well.   To learn more about what you can do with MyChart, go to [email or web address].    Your next appointment:   3 month(s)  The format for your next appointment:   In Person  Provider:   Jyl Heinz, MD   Other Instructions  Blood Pressure Record Sheet To take your blood pressure, you will need a blood pressure machine. You can buy a blood pressure machine (blood pressure monitor) at your clinic, drug store, or online. When choosing one, consider: An automatic monitor that has an arm  cuff. A cuff that wraps snugly around your upper arm. You should be able to fit only one finger between your arm and the cuff. A device that stores blood pressure reading results. Do not choose a monitor that measures your blood pressure from your wrist or finger. Follow your health care provider's instructions for how to take your blood pressure. To use this form: Get one reading in the morning (a.m.) 1-2 hours after you take any medicines. Get one reading in the evening (p.m.) before supper. Take at least 2 readings with each blood pressure check. This makes sure the results are correct. Wait 1-2 minutes between measurements. Write down the results in the spaces on this form. Repeat this once a week, or as told by your health care provider.  Make a follow-up appointment with your health care provider to discuss the results. Blood pressure log Date: _______________________ a.m. _____________________(1st reading) HR___________            p.m. _____________________(2nd reading) HR__________  Date: _______________________ a.m. _____________________(1st reading) HR___________            p.m. _____________________(2nd reading) HR__________ Date: _______________________ a.m. _____________________(1st reading) HR___________            p.m. _____________________(2nd reading) HR__________ Date: _______________________ a.m. _____________________(1st reading) HR___________            p.m. _____________________(2nd reading) HR__________  Date: _______________________ a.m. _____________________(1st reading) HR___________            p.m. _____________________(2nd reading) HR__________  Date: _______________________ a.m. _____________________(1st reading) HR___________  p.m. _____________________(2nd reading) HR__________ ° °Date: _______________________ °a.m. _____________________(1st reading) HR___________ ° °          p.m. _____________________(2nd reading)  HR__________ ° ° °This information is not intended to replace advice given to you by your health care provider. Make sure you discuss any questions you have with your health care provider. °Document Revised: 06/06/2019 Document Reviewed: 06/06/2019 °Elsevier Patient Education © 2021 [employer]  °

## 2021-03-16 NOTE — Progress Notes (Signed)
Cardiology Office Note:    Date:  03/16/2021   ID:  Alisha Cochran [place], [place] 1946-02-18, MRN [id number]  PCP:  Algis Greenhouse, MD  Cardiologist:  Jenean Lindau, MD   Referring MD: Algis Greenhouse, MD    ASSESSMENT:    1. Essential hypertension   2. Mixed dyslipidemia   3. Atherosclerotic vascular disease    PLAN:    In order of problems listed above:  Atherosclerotic vascular disease: Secondary prevention stressed with the patient.  Importance of compliance with diet medication stressed and he vocalized understanding.  She is undergoing chemotherapy for cancer. Essential hypertension: Blood pressure is elevated.  Diet and salt intake issues were discussed lifestyle modification urged I told her to take amlodipine half tablet daily a 5 mg tablet that she has and keep a track of her blood pressures for [place] in a week and send them to [place] in a week.  We will adjust her medications accordingly. Mixed dyslipidemia: Diet emphasized.  Lipids followed by primary care doctor. Patient will be seen in follow-up appointment in 6 months or earlier if the patient has any concerns    Medication Adjustments/Labs and Tests Ordered: Current medicines are reviewed at length with the patient today.  Concerns regarding medicines are outlined above.  No orders of the defined types were placed in this encounter.  No orders of the defined types were placed in this encounter.    No chief complaint on file.    History of Present Illness:    Alisha Cochran is a 76 y.o. female.  Patient has past medical history of atherosclerotic vascular disease, essential hypertension, dyslipidemia and history of TIA.  She is undergoing treatment for cancer.  She denies any problems at this time.  Her blood pressure is on the elevated side.  She has brought blood pressure readings.  Her medications were discontinued recently because of chemotherapy and low blood pressure recordings.  At the time of my  evaluation, the patient is alert awake oriented and in no distress.  Past Medical History:  Diagnosis Date   Acute pain of right knee 09/04/2019   Formatting of this note might be different from the original. 2021   Allergic rhinitis 09/15/2015   Anemia 11/16/2020   Anemia, iron deficiency 07/21/2015   Antineoplastic chemotherapy induced anemia 12/26/2020   Arthropathy 09/15/2015   Benign hypertension 07/21/2015   Bruising 07/04/2020   Formatting of this note might be different from the original. 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 2014: MRI DDD L4/5   Diverticulosis of colon 09/15/2015   2014, 2020: diverticulitis   Dyspnea on exertion 10/25/2018   2020   Essential hypertension 05/08/2019   Family history of premature coronary artery disease 04/12/2018   Fibrocystic breast disease 09/15/2015   Herpes zoster without complication 86/76/1950   2020: left T12   History of TIA (transient ischemic attack) 07/23/2020   Hyperlipemia, mixed 05/18/2015   Insomnia 05/21/2015   Intermittent claudication ([place]) 07/14/2020   Lumbar radiculopathy 09/25/2012   Overview:  2008: onset 2009: MRI DDD with tear and protrusion L5-S1, facet arthropathy L5-S1 2014: MRI L4-5 DDD with progression of L5-S1 changes 2014: Pain eval, limited  Formatting of this note might be different from the original. 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 2008: -0.7 2016: -1.6 2019: -1.5  Formatting of this note might be different from the original. 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   furosemide (LASIX) 40 MG tablet Take 40 mg by mouth as needed for fluid or edema.   LORazepam (ATIVAN) 1 MG tablet Take 1 tablet (1 mg total) by mouth every 8 (eight) hours.   losartan (COZAAR) 100 MG tablet TAKE 1 TABLET(100 MG) BY MOUTH DAILY   metoprolol tartrate (LOPRESSOR) 50 MG tablet Take 50 mg by mouth every 12 (twelve) hours.   Omeprazole Magnesium (PRILOSEC PO) Take 1 tablet by mouth as needed (heartburn).   ondansetron (ZOFRAN) 4 MG tablet  Take 1 tablet (4 mg total) by mouth every 4 (four) hours as needed for nausea.   potassium chloride SA (KLOR-CON M) 20 MEQ tablet Take 1 tablet (20 mEq total) by mouth daily.   pregabalin (LYRICA) 75 MG capsule Take 75 mg by mouth at bedtime.   prochlorperazine (COMPAZINE) 10 MG tablet Take 1 tablet (10 mg total) by mouth every 6 (six) hours as needed for nausea or vomiting.   rosuvastatin (CRESTOR) 40 MG tablet Take 40 mg by mouth daily.   traMADol (ULTRAM) 50 MG tablet Take 50 mg by mouth as needed for moderate pain.   zolpidem (AMBIEN) 10 MG tablet TAKE 1/2 TABLET(5 MG) BY MOUTH EVERY NIGHT AS NEEDED FOR SLEEP     Allergies:   Lisinopril   Social History   Socioeconomic History   Marital status: Married    Spouse name: Not on file   Number of children: 2   Years of education: Not on file   Highest  education level: Not on file  Occupational History   Occupation: retired Consulting civil engineer  Tobacco Use   Smoking status: Former    Types: Cigarettes    Start date: 1966    Quit date: 03/01/1978    Years since quitting: 43.0   Smokeless tobacco: Never  Substance and Sexual Activity   Alcohol use: Yes    Alcohol/week: 14.0 standard drinks    Types: 14 Glasses of wine per week    Comment: with diinner daily for 50 years   Drug use: Never   Sexual activity: Not on file  Other Topics Concern   Not on file  Social History Narrative   Not on file   Social Determinants of Health   Financial Resource Strain: Not on file  Food Insecurity: Not on file  Transportation Needs: Not on file  Physical Activity: Not on file  Stress: Not on file  Social Connections: Not on file     Family History: The patient's family history includes Breast cancer in her maternal grandmother, mother, and paternal grandmother; Heart attack in her paternal grandfather and paternal grandmother; Heart disease in her father.  ROS:   Please see the history of present illness.    All other systems reviewed  and are negative.  EKGs/Labs/Other Studies Reviewed:    The following studies were reviewed today: I discussed my findings with the patient at length   Recent Labs: 02/18/2021: ALT 20; BUN 7; Creatinine 0.7; Hemoglobin 9.4; Platelets 159; Potassium 3.9; Sodium 139  Recent Lipid Panel No results found for: CHOL, TRIG, HDL, CHOLHDL, VLDL, LDLCALC, LDLDIRECT  Physical Exam:    VS:  BP (!) 162/60    Pulse 80    Ht 5\' 5"  (1.651 m)    Wt 172 lb 12.8 oz (78.4 kg)    SpO2 97%    BMI 28.76 kg/m     Wt Readings from Last 3 Encounters:  03/16/21 172 lb 12.8 oz (78.4 kg)  02/27/21 187 lb (84.8 kg)  02/26/21 185 lb (83.9 kg)     GEN: Patient is in no acute distress HEENT: Normal NECK: No JVD; No carotid bruits LYMPHATICS: No lymphadenopathy CARDIAC: Hear sounds regular, 2/6 systolic murmur at the apex. RESPIRATORY:  Clear to auscultation without rales, wheezing or rhonchi  ABDOMEN: Soft, non-tender, non-distended MUSCULOSKELETAL:  No edema; No deformity  SKIN: Warm and dry NEUROLOGIC:  Alert and oriented x 3 PSYCHIATRIC:  Normal affect   Signed, Jenean Lindau, MD  03/16/2021 11:38 AM    [place]

## 2021-03-16 NOTE — Addendum Note (Signed)
Addended by: Truddie Hidden on: 03/16/2021 11:45 AM   Modules accepted: Orders

## 2021-03-17 ENCOUNTER — Encounter: Payer: Self-pay | Admitting: Oncology

## 2021-03-18 NOTE — Progress Notes (Signed)
[facility]  [address] [place],  [place]  [address] [phone]  Clinic Day:  03/24/2021  Referring physician: Algis Greenhouse, MD  This document serves as a record of services personally performed by Dequincy Macarthur Critchley, MD. It was created on their behalf by [facility], a trained medical scribe. The creation of this record is based on the scribe's personal observations and the provider's statements to them.  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  She comes in today prior to her 5th cycle of Bendamustine/Rituxan therapy.  She tolerated her 4th cycle well. She continues to deny having any B symptoms or bulky lymphadenopathy which concerns her for disease progression while on her current regimen.  Of note, scans done after her third cycle of treatment showed essentially complete resolution of her previous right axillary lymphadenopathy.  No new areas of lymphadenopathy were seen to suggest evidence of disease refractoriness.  PHYSICAL EXAM:  Blood pressure (!) 175/76, pulse 64, temperature 98.7 F (37.1 C), resp. rate 16, height 5\' 5"  (1.651 m), weight 173 lb 3.2 oz (78.6 kg), SpO2 99 %. Wt Readings from Last 3 Encounters:  03/24/21 173 lb 3.2 oz (78.6 kg)  03/16/21 172 lb 12.8 oz (78.4 kg)  02/27/21 187 lb (84.8 kg)   Body mass index is 28.82 kg/m. Performance status (ECOG): 1 - Symptomatic but completely ambulatory Physical Exam Constitutional:      Appearance: Normal appearance. She is not ill-appearing.  HENT:     Mouth/Throat:     Mouth: Mucous membranes are moist.     Pharynx: Oropharynx is clear. No oropharyngeal exudate or posterior oropharyngeal erythema.  Cardiovascular:     Rate and Rhythm: Normal rate and regular rhythm.     Heart sounds: No murmur heard.   No friction rub. No gallop.  Pulmonary:     Effort: Pulmonary effort is normal. No respiratory distress.     Breath  sounds: Normal breath sounds. No wheezing, rhonchi or rales.  Abdominal:     General: Bowel sounds are normal. There is no distension.     Palpations: Abdomen is soft. There is no mass.     Tenderness: There is no abdominal tenderness.  Musculoskeletal:        General: No swelling.     Right lower leg: No edema.     Left lower leg: No edema.  Lymphadenopathy:     Cervical: No cervical adenopathy.     Upper Body:     Right upper body: No supraclavicular or axillary adenopathy.     Left upper body: No supraclavicular or axillary adenopathy.     Lower Body: No right inguinal adenopathy. No left inguinal adenopathy.  Skin:    General: Skin is warm.     Coloration: Skin is not jaundiced.     Findings: No lesion or rash.  Neurological:     General: No focal deficit present.     Mental Status: She is alert and oriented to person, place, and time. Mental status is at baseline.  Psychiatric:        Mood and Affect: Mood normal.        Behavior: Behavior normal.        Thought Content: Thought content normal.   LABS:  Latest Reference Range & Units 03/24/21 00:00  Sodium 137 - 147  139  Potassium 3.4 - 5.3  3.5  Chloride 99 - 108  108  CO2 13 - 22  29 !  Glucose  131  BUN 4 - 21  8  Creatinine 0.5 - 1.1  0.7  Calcium 8.7 - 10.7  9.1  Alkaline Phosphatase 25 - 125  66  Albumin 3.5 - 5.0  3.7  AST 13 - 35  41 !  ALT 7 - 35  21  Bilirubin, Total  1.7  WBC  2.8  RBC 3.87 - 5.11  3.41 !  Hemoglobin 12.0 - 16.0  9.9 !  HCT 36 - 46  30 !  Platelets 150 - 399  135 !  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 She will proceed with her 5th cycle of Bendamustine/Rituxan this week.  She will continue to receive Neulasta with each cycle of treatment to prevent severe neutropenia.  Retacrit 40,000 units will also be given to hasten her chemotherapy-induced anemia.  I reminded the patient that Bendamustine is notorious for  leading to prolonged cytopenias.  When evaluating her labs, I am very pleased as her kappa light chain level continues to fall.  Her other monoclonal parameters are pending, but the overall trend with them shows improvement ever since her Bendamustine/Rituxan commenced.  Furthermore, her scans also show a positive radiographic response to her treatment.  Clinically, she appears to be doing well. I will see her back in 4 weeks before she heads into her 6th and final cycle of Bendamustine/Rituxan.  The patient understands all the plans discussed today and is in agreement with them.     I, Rita Ohara, am acting as scribe for [name], MD    I have reviewed this report as typed by the medical scribe, and it is complete and accurate.  Dequincy Macarthur Critchley, MD

## 2021-03-23 ENCOUNTER — Other Ambulatory Visit: Payer: Self-pay | Admitting: Oncology

## 2021-03-23 DIAGNOSIS — C8309 Small cell B-cell lymphoma, extranodal and solid organ sites: Secondary | ICD-10-CM

## 2021-03-24 ENCOUNTER — Other Ambulatory Visit: Payer: Self-pay

## 2021-03-24 ENCOUNTER — Inpatient Hospital Stay: Payer: Medicare PPO | Attending: Oncology | Admitting: Oncology

## 2021-03-24 ENCOUNTER — Other Ambulatory Visit: Payer: Self-pay | Admitting: Oncology

## 2021-03-24 ENCOUNTER — Telehealth: Payer: Self-pay | Admitting: Oncology

## 2021-03-24 ENCOUNTER — Inpatient Hospital Stay: Payer: Medicare PPO

## 2021-03-24 ENCOUNTER — Encounter: Payer: Self-pay | Admitting: Oncology

## 2021-03-24 VITALS — BP 175/76 | HR 64 | Temp 98.7°F | Resp 16 | Ht 65.0 in | Wt 173.2 lb

## 2021-03-24 DIAGNOSIS — C8304 Small cell B-cell lymphoma, lymph nodes of axilla and upper limb: Secondary | ICD-10-CM | POA: Diagnosis not present

## 2021-03-24 DIAGNOSIS — C8590 Non-Hodgkin lymphoma, unspecified, unspecified site: Secondary | ICD-10-CM | POA: Insufficient documentation

## 2021-03-24 DIAGNOSIS — D6481 Anemia due to antineoplastic chemotherapy: Secondary | ICD-10-CM | POA: Insufficient documentation

## 2021-03-24 DIAGNOSIS — Z5189 Encounter for other specified aftercare: Secondary | ICD-10-CM | POA: Insufficient documentation

## 2021-03-24 DIAGNOSIS — Z5112 Encounter for antineoplastic immunotherapy: Secondary | ICD-10-CM | POA: Insufficient documentation

## 2021-03-24 DIAGNOSIS — C8309 Small cell B-cell lymphoma, extranodal and solid organ sites: Secondary | ICD-10-CM

## 2021-03-24 DIAGNOSIS — Z5111 Encounter for antineoplastic chemotherapy: Secondary | ICD-10-CM | POA: Diagnosis present

## 2021-03-24 LAB — BASIC METABOLIC PANEL
BUN: 8 (ref 4–21)
CO2: 29 — AB (ref 13–22)
Chloride: 108 (ref 99–108)
Creatinine: 0.7 (ref 0.5–1.1)
Glucose: 131
Potassium: 3.5 (ref 3.4–5.3)
Sodium: 139 (ref 137–147)

## 2021-03-24 LAB — CBC AND DIFFERENTIAL
HCT: 30 — AB (ref 36–46)
Hemoglobin: 9.9 — AB (ref 12.0–16.0)
Neutrophils Absolute: 1.74
Platelets: 135 — AB (ref 150–399)
WBC: 2.8

## 2021-03-24 LAB — COMPREHENSIVE METABOLIC PANEL
Albumin: 3.7 (ref 3.5–5.0)
Calcium: 9.1 (ref 8.7–10.7)

## 2021-03-24 LAB — HEPATIC FUNCTION PANEL
ALT: 21 (ref 7–35)
AST: 41 — AB (ref 13–35)
Alkaline Phosphatase: 66 (ref 25–125)
Bilirubin, Total: 1.7

## 2021-03-24 LAB — CBC: RBC: 3.41 — AB (ref 3.87–5.11)

## 2021-03-24 NOTE — Telephone Encounter (Signed)
Patient has been scheduled for follow-up visit per 02/01/22 los. Pt given an appt calendar with date and time.  

## 2021-03-25 ENCOUNTER — Encounter: Payer: Self-pay | Admitting: Oncology

## 2021-03-25 ENCOUNTER — Other Ambulatory Visit: Payer: Self-pay | Admitting: Pharmacist

## 2021-03-25 ENCOUNTER — Inpatient Hospital Stay: Payer: Medicare PPO

## 2021-03-25 VITALS — BP 143/63 | HR 64 | Temp 98.3°F | Resp 18 | Ht 65.0 in | Wt 174.0 lb

## 2021-03-25 DIAGNOSIS — D508 Other iron deficiency anemias: Secondary | ICD-10-CM

## 2021-03-25 DIAGNOSIS — C8309 Small cell B-cell lymphoma, extranodal and solid organ sites: Secondary | ICD-10-CM

## 2021-03-25 DIAGNOSIS — T451X5A Adverse effect of antineoplastic and immunosuppressive drugs, initial encounter: Secondary | ICD-10-CM

## 2021-03-25 DIAGNOSIS — Z5112 Encounter for antineoplastic immunotherapy: Secondary | ICD-10-CM | POA: Diagnosis not present

## 2021-03-25 LAB — KAPPA/LAMBDA LIGHT CHAINS
Kappa free light chain: 267.8 mg/L — ABNORMAL HIGH (ref 3.3–19.4)
Kappa, lambda light chain ratio: 34.33 — ABNORMAL HIGH (ref 0.26–1.65)
Lambda free light chains: 7.8 mg/L (ref 5.7–26.3)

## 2021-03-25 MED ORDER — HEPARIN SOD (PORK) LOCK FLUSH 100 UNIT/ML IV SOLN
500.0000 [IU] | Freq: Once | INTRAVENOUS | Status: AC | PRN
  Administered 2021-03-25: 500 [IU]

## 2021-03-25 MED ORDER — DIPHENHYDRAMINE HCL 25 MG PO CAPS
50.0000 mg | ORAL_CAPSULE | Freq: Once | ORAL | Status: AC
  Administered 2021-03-25: 50 mg via ORAL
  Filled 2021-03-25: qty 2

## 2021-03-25 MED ORDER — SODIUM CHLORIDE 0.9 % IV SOLN: Freq: Once | INTRAVENOUS | Status: AC

## 2021-03-25 MED ORDER — PALONOSETRON HCL INJECTION 0.25 MG/5ML
0.2500 mg | Freq: Once | INTRAVENOUS | Status: AC
  Administered 2021-03-25: 0.25 mg via INTRAVENOUS
  Filled 2021-03-25: qty 5

## 2021-03-25 MED ORDER — SODIUM CHLORIDE 0.9 % IV SOLN
72.0000 mg/m2 | Freq: Once | INTRAVENOUS | Status: AC
  Administered 2021-03-25: 150 mg via INTRAVENOUS
  Filled 2021-03-25: qty 6

## 2021-03-25 MED ORDER — SODIUM CHLORIDE 0.9 % IV SOLN
10.0000 mg | Freq: Once | INTRAVENOUS | Status: AC
  Administered 2021-03-25: 10 mg via INTRAVENOUS
  Filled 2021-03-25: qty 10

## 2021-03-25 MED ORDER — SODIUM CHLORIDE 0.9 % IV SOLN
375.0000 mg/m2 | Freq: Once | INTRAVENOUS | Status: AC
  Administered 2021-03-25: 700 mg via INTRAVENOUS
  Filled 2021-03-25: qty 50

## 2021-03-25 MED ORDER — ACETAMINOPHEN 325 MG PO TABS
650.0000 mg | ORAL_TABLET | Freq: Once | ORAL | Status: AC
  Administered 2021-03-25: 650 mg via ORAL
  Filled 2021-03-25: qty 2

## 2021-03-25 MED ORDER — EPOETIN ALFA-EPBX 40000 UNIT/ML IJ SOLN
40000.0000 [IU] | Freq: Once | INTRAMUSCULAR | Status: AC
  Administered 2021-03-25: 40000 [IU] via SUBCUTANEOUS
  Filled 2021-03-25: qty 1

## 2021-03-25 MED ORDER — SODIUM CHLORIDE 0.9% FLUSH
10.0000 mL | INTRAVENOUS | Status: DC | PRN
  Administered 2021-03-25: 10 mL

## 2021-03-25 NOTE — Patient Instructions (Signed)
Bendamustine Injection °What is this medication? °BENDAMUSTINE (BEN da MUS teen) is a chemotherapy drug. It is used to treat chronic lymphocytic leukemia and non-Hodgkin lymphoma. °This medicine may be used for other purposes; ask your health care provider or pharmacist if you have questions. °COMMON BRAND NAME(S): BELRAPZO, BENDEKA, Treanda °What should I tell my care team before I take this medication? °They need to know if you have any of these conditions: °infection (especially a virus infection such as chickenpox, cold sores, or herpes) °kidney disease °liver disease °an unusual or allergic reaction to bendamustine, mannitol, other medicines, foods, dyes, or preservatives °pregnant or trying to get pregnant °breast-feeding °How should I use this medication? °This medicine is for infusion into a vein. It is given by a health care professional in a hospital or clinic setting. °Talk to your pediatrician regarding the use of this medicine in children. Special care may be needed. °Overdosage: If you think you have taken too much of this medicine contact a poison control center or emergency room at once. °NOTE: This medicine is only for you. Do not share this medicine with others. °What if I miss a dose? °It is important not to miss your dose. Call your doctor or health care professional if you are unable to keep an appointment. °What may interact with this medication? °Do not take this medicine with any of the following medications: °clozapine °This medicine may also interact with the following medications: °atazanavir °cimetidine °ciprofloxacin °enoxacin °fluvoxamine °medicines for seizures like carbamazepine and phenobarbital °mexiletine °rifampin °tacrine °thiabendazole °zileuton °This list may not describe all possible interactions. Give your health care provider a list of all the medicines, herbs, non-prescription drugs, or dietary supplements you use. Also tell them if you smoke, drink alcohol, or use illegal  drugs. Some items may interact with your medicine. °What should I watch for while using this medication? °This drug may make you feel generally unwell. This is not uncommon, as chemotherapy can affect healthy cells as well as cancer cells. Report any side effects. Continue your course of treatment even though you feel ill unless your doctor tells you to stop. °You may need blood work done while you are taking this medicine. °Call your doctor or healthcare provider for advice if you get a fever, chills or sore throat, or other symptoms of a cold or flu. Do not treat yourself. This drug decreases your body's ability to fight infections. Try to avoid being around people who are sick. °This medicine may cause serious skin reactions. They can happen weeks to months after starting the medicine. Contact your healthcare provider right away if you notice fevers or flu-like symptoms with a rash. The rash may be red or purple and then turn into blisters or peeling of the skin. Or, you might notice a red rash with swelling of the face, lips or lymph nodes in your neck or under your arms. °In some patients, this medicine may cause a serious brain infection that may cause death. If you have any problems seeing, thinking, speaking, walking, or standing, tell your health care provider right away. If you cannot reach your health care provider, urgently seek other source of medical care. °This medicine may increase your risk to bruise or bleed. Call your doctor or healthcare provider if you notice any unusual bleeding. °Talk to your doctor about your risk of cancer. You may be more at risk for certain types of cancers if you take this medicine. °This medicine may increase your risk of skin   cancer. Check your skin for changes to moles or for new growths while taking this medicine. Call your health care provider if you notice any of these skin changes. Do not become pregnant while taking this medicine or for at least 6 months after  stopping it. Women should inform their doctor if they wish to become pregnant or think they might be pregnant. Men should not father a child while taking this medicine and for at least 3 months after stopping it. There is a potential for serious side effects to an unborn child. Talk to your healthcare provider or pharmacist for more information. Do not breast-feed an infant while taking this medicine or for at least 1 week after stopping it. This medicine may make it more difficult to father a child. You should talk with your doctor or healthcare provider if you are concerned about your fertility. What side effects may I notice from receiving this medication? Side effects that you should report to your doctor or health care professional as soon as possible: allergic reactions like skin rash, itching or hives, swelling of the face, lips, or tongue low blood counts - this medicine may decrease the number of white blood cells, red blood cells and platelets. You may be at increased risk for infections and bleeding. rash, fever, and swollen lymph nodes redness, blistering, peeling, or loosening of the skin, including inside the mouth signs of infection like fever or chills, cough, sore throat, pain or difficulty passing urine signs of decreased platelets or bleeding like bruising, pinpoint red spots on the skin, black, tarry stools, blood in the urine signs of decreased red blood cells like being unusually weak or tired, fainting spells, lightheadedness signs and symptoms of kidney injury like trouble passing urine or change in the amount of urine signs and symptoms of liver injury like dark yellow or brown urine; general ill feeling or flu-like symptoms; light-colored stools; loss of appetite; nausea; right upper belly pain; unusually weak or tired; yellowing of the eyes or skin Side effects that usually do not require medical attention (report to your doctor or health care professional if they continue or  are bothersome): constipation decreased appetite diarrhea headache mouth sores nausea, vomiting tiredness This list may not describe all possible side effects. Call your doctor for medical advice about side effects. You may report side effects to FDA at 1-800-FDA-1088. Where should I keep my medication? This drug is given in a hospital or clinic and will not be stored at home. NOTE: This sheet is a summary. It may not cover all possible information. If you have questions about this medicine, talk to your doctor, pharmacist, or health care provider.  2022 Elsevier/Gold Standard (2019-08-14 00:00:00) Rituximab; Hyaluronidase injection What is this medication? RITUXIMAB; HYALURONIDASE (ri TUX i mab / hye al ur ON i dase) is used to treat non-Hodgkin lymphoma and chronic lymphocytic leukemia. Rituximab is a monoclonal antibody. Hyaluronidase is used to improve the effects of rituximab. This medicine may be used for other purposes; ask your health care provider or pharmacist if you have questions. COMMON BRAND NAME(S): Rituxan Hycela What should I tell my care team before I take this medication? They need to know if you have any of these conditions: heart disease infection (especially a virus infection such as hepatitis B, chickenpox, cold sores, or herpes) immune system problems irregular heartbeat kidney disease lung or breathing disease, like asthma recently received or scheduled to receive a vaccine an unusual or allergic reaction to rituximab, rituximab/hyaluronidase, mouse  proteins, other medicines, foods, dyes, or preservatives pregnant or trying to get pregnant breast-feeding How should I use this medication? This medicine is for injection under the skin. It is given by a health care professional in a hospital or clinic setting. A special MedGuide will be given to you before each treatment. Be sure to read this information carefully each time. Talk to your pediatrician regarding  the use of this medicine in children. Special care may be needed. Overdosage: If you think you have taken too much of this medicine contact a poison control center or emergency room at once. NOTE: This medicine is only for you. Do not share this medicine with others. What if I miss a dose? It is important not to miss your dose. Call your doctor or health care professional if you are unable to keep an appointment. What may interact with this medication? This medicine may interact with the following medications: cisplatin live virus vaccines This list may not describe all possible interactions. Give your health care provider a list of all the medicines, herbs, non-prescription drugs, or dietary supplements you use. Also tell them if you smoke, drink alcohol, or use illegal drugs. Some items may interact with your medicine. What should I watch for while using this medication? Your condition will be monitored carefully while you are receiving this medicine. You may need blood work done while you are taking this medicine. This medicine can cause serious allergic reactions. To reduce your risk you may need to take medicine before treatment with this medicine. Take your medicine as directed. In some patients, this medicine may cause a serious brain infection that may cause death. If you have any problems seeing, thinking, speaking, walking, or standing, tell your doctor right away. If you cannot reach your doctor, urgently seek other source of medical care. Call your doctor or health care professional for advice if you get a fever, chills or sore throat, or other symptoms of a cold or flu. Do not treat yourself. This drug decreases your body's ability to fight infections. Try to avoid being around people who are sick. Do not become pregnant while taking this medicine or for 12 months after stopping it. Women should inform their doctor if they wish to become pregnant or think they might be pregnant. There is  a potential for serious side effects to an unborn child. Talk to your health care professional or pharmacist for more information. Do not breast-feed an infant while taking this medicine or for at least 6 months after stopping it. What side effects may I notice from receiving this medication? Side effects that you should report to your doctor or health care professional as soon as possible: allergic reactions like skin rash, itching or hives; swelling of the face, lips, or tongue breathing problems chest pain changes in vision diarrhea dizziness headache with fever, neck stiffness, sensitivity to light, nausea, or confusion fast, irregular heartbeat loss of memory low blood counts - this medicine may decrease the number of white blood cells, red blood cells and platelets. You may be at increased risk for infections and bleeding. mouth sores problems with balance, talking, or walking redness, blistering, peeling or loosening of the skin, including inside the mouth signs of infection - fever or chills, cough, sore throat, pain or difficulty passing urine signs and symptoms of kidney injury like trouble passing urine or change in the amount of urine signs and symptoms of liver injury like dark yellow or brown urine; general ill feeling or  flu-like symptoms; light-colored stools; loss of appetite; nausea; right upper belly pain; unusually weak or tired; yellowing of the eyes or skin stomach pain swelling of the ankles, feet, hands unusual bleeding or bruising vomiting Side effects that usually do not require medical attention (report these to your doctor or health care professional if they continue or are bothersome): headache joint pain muscle cramps or muscle pain nausea pain, redness, or irritation at site where injected tiredness This list may not describe all possible side effects. Call your doctor for medical advice about side effects. You may report side effects to FDA at  1-800-FDA-1088. Where should I keep my medication? This drug is given in a hospital or clinic and will not be stored at home. NOTE: This sheet is a summary. It may not cover all possible information. If you have questions about this medicine, talk to your doctor, pharmacist, or health care provider.  2022 Elsevier/Gold Standard (2017-01-28 00:00:00)

## 2021-03-26 ENCOUNTER — Telehealth: Payer: Self-pay

## 2021-03-26 ENCOUNTER — Other Ambulatory Visit: Payer: Self-pay

## 2021-03-26 ENCOUNTER — Inpatient Hospital Stay: Payer: Medicare PPO

## 2021-03-26 VITALS — BP 168/58 | HR 55 | Temp 97.8°F | Resp 18 | Ht 65.0 in | Wt 177.0 lb

## 2021-03-26 DIAGNOSIS — C8309 Small cell B-cell lymphoma, extranodal and solid organ sites: Secondary | ICD-10-CM

## 2021-03-26 DIAGNOSIS — Z5112 Encounter for antineoplastic immunotherapy: Secondary | ICD-10-CM | POA: Diagnosis not present

## 2021-03-26 MED ORDER — HEPARIN SOD (PORK) LOCK FLUSH 100 UNIT/ML IV SOLN
500.0000 [IU] | Freq: Once | INTRAVENOUS | Status: AC | PRN
  Administered 2021-03-26: 500 [IU]

## 2021-03-26 MED ORDER — SODIUM CHLORIDE 0.9 % IV SOLN
10.0000 mg | Freq: Once | INTRAVENOUS | Status: AC
  Administered 2021-03-26: 10 mg via INTRAVENOUS
  Filled 2021-03-26: qty 1

## 2021-03-26 MED ORDER — AMLODIPINE BESYLATE 10 MG PO TABS: 10.0000 mg | ORAL_TABLET | Freq: Every day | ORAL | 3 refills | Status: DC

## 2021-03-26 MED ORDER — SODIUM CHLORIDE 0.9% FLUSH
10.0000 mL | INTRAVENOUS | Status: DC | PRN
  Administered 2021-03-26: 10 mL

## 2021-03-26 MED ORDER — SODIUM CHLORIDE 0.9 % IV SOLN: Freq: Once | INTRAVENOUS | Status: AC

## 2021-03-26 MED ORDER — SODIUM CHLORIDE 0.9 % IV SOLN
72.0000 mg/m2 | Freq: Once | INTRAVENOUS | Status: AC
  Administered 2021-03-26: 150 mg via INTRAVENOUS
  Filled 2021-03-26: qty 6

## 2021-03-26 NOTE — Telephone Encounter (Signed)
Pt will increase her Amlodipine 10 mg daily. She will keep a BP log and send it in 1 week. Pt will also decrease the salt in her diet. Pt verbalized understanding and had no additional questions.

## 2021-03-26 NOTE — Patient Instructions (Signed)
Bendamustine Injection °What is this medication? °BENDAMUSTINE (BEN da MUS teen) is a chemotherapy drug. It is used to treat chronic lymphocytic leukemia and non-Hodgkin lymphoma. °This medicine may be used for other purposes; ask your health care provider or pharmacist if you have questions. °COMMON BRAND NAME(S): BELRAPZO, BENDEKA, Treanda °What should I tell my care team before I take this medication? °They need to know if you have any of these conditions: °infection (especially a virus infection such as chickenpox, cold sores, or herpes) °kidney disease °liver disease °an unusual or allergic reaction to bendamustine, mannitol, other medicines, foods, dyes, or preservatives °pregnant or trying to get pregnant °breast-feeding °How should I use this medication? °This medicine is for infusion into a vein. It is given by a health care professional in a hospital or clinic setting. °Talk to your pediatrician regarding the use of this medicine in children. Special care may be needed. °Overdosage: If you think you have taken too much of this medicine contact a poison control center or emergency room at once. °NOTE: This medicine is only for you. Do not share this medicine with others. °What if I miss a dose? °It is important not to miss your dose. Call your doctor or health care professional if you are unable to keep an appointment. °What may interact with this medication? °Do not take this medicine with any of the following medications: °clozapine °This medicine may also interact with the following medications: °atazanavir °cimetidine °ciprofloxacin °enoxacin °fluvoxamine °medicines for seizures like carbamazepine and phenobarbital °mexiletine °rifampin °tacrine °thiabendazole °zileuton °This list may not describe all possible interactions. Give your health care provider a list of all the medicines, herbs, non-prescription drugs, or dietary supplements you use. Also tell them if you smoke, drink alcohol, or use illegal  drugs. Some items may interact with your medicine. °What should I watch for while using this medication? °This drug may make you feel generally unwell. This is not uncommon, as chemotherapy can affect healthy cells as well as cancer cells. Report any side effects. Continue your course of treatment even though you feel ill unless your doctor tells you to stop. °You may need blood work done while you are taking this medicine. °Call your doctor or healthcare provider for advice if you get a fever, chills or sore throat, or other symptoms of a cold or flu. Do not treat yourself. This drug decreases your body's ability to fight infections. Try to avoid being around people who are sick. °This medicine may cause serious skin reactions. They can happen weeks to months after starting the medicine. Contact your healthcare provider right away if you notice fevers or flu-like symptoms with a rash. The rash may be red or purple and then turn into blisters or peeling of the skin. Or, you might notice a red rash with swelling of the face, lips or lymph nodes in your neck or under your arms. °In some patients, this medicine may cause a serious brain infection that may cause death. If you have any problems seeing, thinking, speaking, walking, or standing, tell your health care provider right away. If you cannot reach your health care provider, urgently seek other source of medical care. °This medicine may increase your risk to bruise or bleed. Call your doctor or healthcare provider if you notice any unusual bleeding. °Talk to your doctor about your risk of cancer. You may be more at risk for certain types of cancers if you take this medicine. °This medicine may increase your risk of skin   cancer. Check your skin for changes to moles or for new growths while taking this medicine. Call your health care provider if you notice any of these skin changes. °Do not become pregnant while taking this medicine or for at least 6 months after  stopping it. Women should inform their doctor if they wish to become pregnant or think they might be pregnant. Men should not father a child while taking this medicine and for at least 3 months after stopping it. There is a potential for serious side effects to an unborn child. Talk to your healthcare provider or pharmacist for more information. Do not breast-feed an infant while taking this medicine or for at least 1 week after stopping it. °This medicine may make it more difficult to father a child. You should talk with your doctor or healthcare provider if you are concerned about your fertility. °What side effects may I notice from receiving this medication? °Side effects that you should report to your doctor or health care professional as soon as possible: °allergic reactions like skin rash, itching or hives, swelling of the face, lips, or tongue °low blood counts - this medicine may decrease the number of white blood cells, red blood cells and platelets. You may be at increased risk for infections and bleeding. °rash, fever, and swollen lymph nodes °redness, blistering, peeling, or loosening of the skin, including inside the mouth °signs of infection like fever or chills, cough, sore throat, pain or difficulty passing urine °signs of decreased platelets or bleeding like bruising, pinpoint red spots on the skin, black, tarry stools, blood in the urine °signs of decreased red blood cells like being unusually weak or tired, fainting spells, lightheadedness °signs and symptoms of kidney injury like trouble passing urine or change in the amount of urine °signs and symptoms of liver injury like dark yellow or brown urine; general ill feeling or flu-like symptoms; light-colored stools; loss of appetite; nausea; right upper belly pain; unusually weak or tired; yellowing of the eyes or skin °Side effects that usually do not require medical attention (report to your doctor or health care professional if they continue or  are bothersome): °constipation °decreased appetite °diarrhea °headache °mouth sores °nausea, vomiting °tiredness °This list may not describe all possible side effects. Call your doctor for medical advice about side effects. You may report side effects to FDA at 1-800-FDA-1088. °Where should I keep my medication? °This drug is given in a hospital or clinic and will not be stored at home. °NOTE: This sheet is a summary. It may not cover all possible information. If you have questions about this medicine, talk to your doctor, pharmacist, or health care provider. °© 2022 [employer]/Gold Standard (2019-08-14 00:00:00) ° °

## 2021-03-27 ENCOUNTER — Ambulatory Visit: Payer: Medicare PPO

## 2021-03-27 ENCOUNTER — Inpatient Hospital Stay: Payer: Medicare PPO

## 2021-03-27 VITALS — BP 156/62 | HR 57 | Temp 97.8°F | Resp 18 | Ht 65.0 in | Wt 177.0 lb

## 2021-03-27 DIAGNOSIS — C8309 Small cell B-cell lymphoma, extranodal and solid organ sites: Secondary | ICD-10-CM

## 2021-03-27 DIAGNOSIS — Z5112 Encounter for antineoplastic immunotherapy: Secondary | ICD-10-CM | POA: Diagnosis not present

## 2021-03-27 LAB — MULTIPLE MYELOMA PANEL, SERUM
Albumin SerPl Elph-Mcnc: 3.5 g/dL (ref 2.9–4.4)
Albumin/Glob SerPl: 1.6 (ref 0.7–1.7)
Alpha 1: 0.3 g/dL (ref 0.0–0.4)
Alpha2 Glob SerPl Elph-Mcnc: 0.5 g/dL (ref 0.4–1.0)
B-Globulin SerPl Elph-Mcnc: 0.7 g/dL (ref 0.7–1.3)
Gamma Glob SerPl Elph-Mcnc: 0.9 g/dL (ref 0.4–1.8)
Globulin, Total: 2.3 g/dL (ref 2.2–3.9)
IgA: 8 mg/dL — ABNORMAL LOW (ref 64–422)
IgG (Immunoglobin G), Serum: 932 mg/dL (ref 586–1602)
IgM (Immunoglobulin M), Srm: <5 mg/dL — ABNORMAL LOW (ref 26–217)
M Protein SerPl Elph-Mcnc: 0.6 g/dL — ABNORMAL HIGH
Total Protein ELP: 5.8 g/dL — ABNORMAL LOW (ref 6.0–8.5)

## 2021-03-27 MED ORDER — PEGFILGRASTIM-CBQV 6 MG/0.6ML ~~LOC~~ SOSY
6.0000 mg | PREFILLED_SYRINGE | Freq: Once | SUBCUTANEOUS | Status: AC
  Administered 2021-03-27: 6 mg via SUBCUTANEOUS
  Filled 2021-03-27: qty 0.6

## 2021-03-27 NOTE — Patient Instructions (Signed)

## 2021-03-30 ENCOUNTER — Encounter: Payer: Self-pay | Admitting: Oncology

## 2021-04-15 NOTE — Progress Notes (Signed)
Alisha Cochran  [address] [place],  [place]  [address] [phone]  Clinic Day:  04/21/2021  Referring physician: Algis Greenhouse, MD  This document serves as a record of services personally performed by Alisha Self Macarthur Critchley, MD. It was created on their behalf by [facility], a trained medical scribe. The creation of this record is based on the scribe's personal observations and the provider's statements to them.  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  She comes in today prior to her 6th and final cycle of Bendamustine/Rituxan therapy.  She tolerated her 5th cycle well. She continues to deny having any B symptoms or bulky lymphadenopathy which concerns her for disease progression while on her current regimen.    PHYSICAL EXAM:  Blood pressure (!) 143/65, pulse 65, temperature 97.8 F (36.6 C), resp. rate 14, height 5\' 5"  (1.651 m), weight 165 lb 9.6 oz (75.1 kg), SpO2 100 %. Wt Readings from Last 3 Encounters:  04/21/21 165 lb 9.6 oz (75.1 kg)  03/27/21 177 lb (80.3 kg)  03/26/21 177 lb (80.3 kg)   Body mass index is 27.56 kg/m. Performance status (ECOG): 1 - Symptomatic but completely ambulatory Physical Exam Constitutional:      Appearance: Normal appearance. She is not ill-appearing.  HENT:     Mouth/Throat:     Mouth: Mucous membranes are moist.     Pharynx: Oropharynx is clear. No oropharyngeal exudate or posterior oropharyngeal erythema.  Cardiovascular:     Rate and Rhythm: Normal rate and regular rhythm.     Heart sounds: No murmur heard.   No friction rub. No gallop.  Pulmonary:     Effort: Pulmonary effort is normal. No respiratory distress.     Breath sounds: Normal breath sounds. No wheezing, rhonchi or rales.  Abdominal:     General: Bowel sounds are normal. There is no distension.     Palpations: Abdomen is soft. There is no mass.     Tenderness: There is no abdominal  tenderness.  Musculoskeletal:        General: No swelling.     Right lower leg: No edema.     Left lower leg: No edema.  Lymphadenopathy:     Cervical: No cervical adenopathy.     Upper Body:     Right upper body: No supraclavicular or axillary adenopathy.     Left upper body: No supraclavicular or axillary adenopathy.     Lower Body: No right inguinal adenopathy. No left inguinal adenopathy.  Skin:    General: Skin is warm.     Coloration: Skin is not jaundiced.     Findings: No lesion or rash.  Neurological:     General: No focal deficit present.     Mental Status: She is alert and oriented to person, place, and time. Mental status is at baseline.  Psychiatric:        Mood and Affect: Mood normal.        Behavior: Behavior normal.        Thought Content: Thought content normal.   LABS:  Latest Reference Range & Units 04/21/21 00:00  Sodium 137 - 147  139  Potassium 3.4 - 5.3  3.8  Chloride 99 - 108  109 !  CO2 13 - 22  25 !  Glucose  133  BUN 4 - 21  13  Creatinine 0.5 - 1.1  0.7  Calcium 8.7 -  10.7  9.1  Alkaline Phosphatase 25 - 125  214 !  Albumin 3.5 - 5.0  3.9  AST 13 - 35  43 !  ALT 7 - 35  26  Bilirubin, Total  1.6  WBC  4.0  RBC 3.87 - 5.11  3.33 !  Hemoglobin 12.0 - 16.0  9.9 !  HCT 36 - 46  29 !  Platelets 150 - 399  149 !  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 She will proceed with her 6th and final cycle of Bendamustine/Rituxan this week.  She will continue to receive Neulasta with each cycle of treatment to prevent severe neutropenia.  Retacrit 40,000 units will also be given for her chemotherapy-induced anemia.  Clinically, she appears to be doing well. I will see her back in 2 months for repeat clinical assessment.  CT scans of her chest/abdomen/pelvis and monoclonal protein studies will also be done before her next visit to ascertain her new disease baseline after the completion of her 6  cycles of Bendamustine/Rituxan.  The patient understands all the plans discussed today and is in agreement with them.     I, Rita Ohara, am acting as scribe for [name], MD    I have reviewed this report as typed by the medical scribe, and it is complete and accurate.  Alisha Carns Macarthur Critchley, MD

## 2021-04-21 ENCOUNTER — Inpatient Hospital Stay: Payer: Medicare PPO | Admitting: Oncology

## 2021-04-21 ENCOUNTER — Telehealth: Payer: Self-pay | Admitting: Oncology

## 2021-04-21 ENCOUNTER — Encounter: Payer: Self-pay | Admitting: Oncology

## 2021-04-21 ENCOUNTER — Other Ambulatory Visit: Payer: Self-pay | Admitting: Cardiology

## 2021-04-21 ENCOUNTER — Inpatient Hospital Stay: Payer: Medicare PPO | Attending: Oncology

## 2021-04-21 ENCOUNTER — Other Ambulatory Visit: Payer: Self-pay

## 2021-04-21 VITALS — BP 143/65 | HR 65 | Temp 97.8°F | Resp 14 | Ht 65.0 in | Wt 165.6 lb

## 2021-04-21 DIAGNOSIS — Z5112 Encounter for antineoplastic immunotherapy: Secondary | ICD-10-CM | POA: Insufficient documentation

## 2021-04-21 DIAGNOSIS — Z5189 Encounter for other specified aftercare: Secondary | ICD-10-CM | POA: Insufficient documentation

## 2021-04-21 DIAGNOSIS — C8288 Other types of follicular lymphoma, lymph nodes of multiple sites: Secondary | ICD-10-CM | POA: Diagnosis not present

## 2021-04-21 DIAGNOSIS — C8510 Unspecified B-cell lymphoma, unspecified site: Secondary | ICD-10-CM | POA: Diagnosis present

## 2021-04-21 DIAGNOSIS — D6481 Anemia due to antineoplastic chemotherapy: Secondary | ICD-10-CM | POA: Insufficient documentation

## 2021-04-21 DIAGNOSIS — Z5111 Encounter for antineoplastic chemotherapy: Secondary | ICD-10-CM | POA: Insufficient documentation

## 2021-04-21 DIAGNOSIS — D472 Monoclonal gammopathy: Secondary | ICD-10-CM | POA: Diagnosis not present

## 2021-04-21 DIAGNOSIS — C8304 Small cell B-cell lymphoma, lymph nodes of axilla and upper limb: Secondary | ICD-10-CM

## 2021-04-21 LAB — HEPATIC FUNCTION PANEL
ALT: 26 (ref 7–35)
AST: 43 — AB (ref 13–35)
Alkaline Phosphatase: 214 — AB (ref 25–125)
Bilirubin, Total: 1.6

## 2021-04-21 LAB — COMPREHENSIVE METABOLIC PANEL
Albumin: 3.9 (ref 3.5–5.0)
Calcium: 9.1 (ref 8.7–10.7)

## 2021-04-21 LAB — CBC: RBC: 3.33 — AB (ref 3.87–5.11)

## 2021-04-21 LAB — BASIC METABOLIC PANEL
BUN: 13 (ref 4–21)
CO2: 25 — AB (ref 13–22)
Chloride: 109 — AB (ref 99–108)
Creatinine: 0.7 (ref 0.5–1.1)
Glucose: 133
Potassium: 3.8 (ref 3.4–5.3)
Sodium: 139 (ref 137–147)

## 2021-04-21 LAB — CBC AND DIFFERENTIAL
HCT: 29 — AB (ref 36–46)
Hemoglobin: 9.9 — AB (ref 12.0–16.0)
Neutrophils Absolute: 2.96
Platelets: 149 — AB (ref 150–399)
WBC: 4

## 2021-04-21 MED FILL — Dexamethasone Sodium Phosphate Inj 100 MG/10ML: INTRAMUSCULAR | Qty: 1 | Status: AC

## 2021-04-21 NOTE — Telephone Encounter (Signed)
Patient has been scheduled for follow-up visit per 04/21/21 los. Pt given an appt calendar with date and time.

## 2021-04-22 ENCOUNTER — Inpatient Hospital Stay: Payer: Medicare PPO

## 2021-04-22 ENCOUNTER — Telehealth: Payer: Self-pay | Admitting: Cardiology

## 2021-04-22 ENCOUNTER — Encounter: Payer: Self-pay | Admitting: Oncology

## 2021-04-22 VITALS — BP 118/38 | HR 63 | Temp 97.9°F | Resp 20 | Ht 65.0 in | Wt 165.0 lb

## 2021-04-22 DIAGNOSIS — C8309 Small cell B-cell lymphoma, extranodal and solid organ sites: Secondary | ICD-10-CM

## 2021-04-22 DIAGNOSIS — T451X5A Adverse effect of antineoplastic and immunosuppressive drugs, initial encounter: Secondary | ICD-10-CM

## 2021-04-22 DIAGNOSIS — D508 Other iron deficiency anemias: Secondary | ICD-10-CM

## 2021-04-22 DIAGNOSIS — D6481 Anemia due to antineoplastic chemotherapy: Secondary | ICD-10-CM

## 2021-04-22 DIAGNOSIS — Z5112 Encounter for antineoplastic immunotherapy: Secondary | ICD-10-CM | POA: Diagnosis not present

## 2021-04-22 LAB — KAPPA/LAMBDA LIGHT CHAINS
Kappa free light chain: 215.1 mg/L — ABNORMAL HIGH (ref 3.3–19.4)
Kappa, lambda light chain ratio: 29.07 — ABNORMAL HIGH (ref 0.26–1.65)
Lambda free light chains: 7.4 mg/L (ref 5.7–26.3)

## 2021-04-22 MED ORDER — SODIUM CHLORIDE 0.9 % IV SOLN
375.0000 mg/m2 | Freq: Once | INTRAVENOUS | Status: AC
  Administered 2021-04-22: 700 mg via INTRAVENOUS
  Filled 2021-04-22: qty 50

## 2021-04-22 MED ORDER — HEPARIN SOD (PORK) LOCK FLUSH 100 UNIT/ML IV SOLN
500.0000 [IU] | Freq: Once | INTRAVENOUS | Status: AC | PRN
  Administered 2021-04-22: 500 [IU]

## 2021-04-22 MED ORDER — SODIUM CHLORIDE 0.9 % IV SOLN
72.0000 mg/m2 | Freq: Once | INTRAVENOUS | Status: AC
  Administered 2021-04-22: 125 mg via INTRAVENOUS
  Filled 2021-04-22: qty 5

## 2021-04-22 MED ORDER — SODIUM CHLORIDE 0.9% FLUSH
10.0000 mL | INTRAVENOUS | Status: DC | PRN
  Administered 2021-04-22: 10 mL

## 2021-04-22 MED ORDER — SODIUM CHLORIDE 0.9 % IV SOLN: Freq: Once | INTRAVENOUS | Status: AC

## 2021-04-22 MED ORDER — DIPHENHYDRAMINE HCL 25 MG PO CAPS
50.0000 mg | ORAL_CAPSULE | Freq: Once | ORAL | Status: AC
  Administered 2021-04-22: 50 mg via ORAL
  Filled 2021-04-22: qty 2

## 2021-04-22 MED ORDER — PALONOSETRON HCL INJECTION 0.25 MG/5ML
0.2500 mg | Freq: Once | INTRAVENOUS | Status: AC
  Administered 2021-04-22: 0.25 mg via INTRAVENOUS
  Filled 2021-04-22: qty 5

## 2021-04-22 MED ORDER — EPOETIN ALFA-EPBX 40000 UNIT/ML IJ SOLN
40000.0000 [IU] | Freq: Once | INTRAMUSCULAR | Status: AC
  Administered 2021-04-22: 40000 [IU] via SUBCUTANEOUS
  Filled 2021-04-22: qty 1

## 2021-04-22 MED ORDER — SODIUM CHLORIDE 0.9 % IV SOLN
10.0000 mg | Freq: Once | INTRAVENOUS | Status: AC
  Administered 2021-04-22: 10 mg via INTRAVENOUS
  Filled 2021-04-22: qty 10

## 2021-04-22 MED ORDER — ACETAMINOPHEN 325 MG PO TABS
650.0000 mg | ORAL_TABLET | Freq: Once | ORAL | Status: AC
  Administered 2021-04-22: 650 mg via ORAL
  Filled 2021-04-22: qty 2

## 2021-04-22 MED FILL — Bendamustine HCl IV Soln 100 MG/4ML (25 MG/ML): INTRAVENOUS | Qty: 5 | Status: AC

## 2021-04-22 MED FILL — Dexamethasone Sodium Phosphate Inj 100 MG/10ML: INTRAMUSCULAR | Qty: 1 | Status: AC

## 2021-04-22 NOTE — Telephone Encounter (Signed)
BP's reviewed by Dr. Geraldo Pitter and he states that the numbers are good but if pt is feeling bad we can adjust medication. Pt states she feels good. Advised to callback with problems. Pt verbalized understanding and had no additional questions.

## 2021-04-22 NOTE — Telephone Encounter (Signed)
Pt's blood pressure was too high, but after a recent medication change, weight loss, and chemo pt feels like it's too low.  Bp record put in Revankar's box  Please advise [phone]  Thank you!

## 2021-04-22 NOTE — Patient Instructions (Signed)
Rituximab Injection What is this medication? RITUXIMAB (ri TUX i mab) is a monoclonal antibody. It is used to treat certain types of cancer like non-Hodgkin lymphoma and chronic lymphocytic leukemia. It is also used to treat rheumatoid arthritis, granulomatosis with polyangiitis, microscopic polyangiitis, and pemphigus vulgaris. This medicine may be used for other purposes; ask your health care provider or pharmacist if you have questions. COMMON BRAND NAME(S): RIABNI, Rituxan, RUXIENCE What should I tell my care team before I take this medication? They need to know if you have any of these conditions: chest pain heart disease infection especially a viral infection such as chickenpox, cold sores, hepatitis B, or herpes immune system problems irregular heartbeat or rhythm kidney disease low blood counts (white cells, platelets, or red cells) lung disease recent or upcoming vaccine an unusual or allergic reaction to rituximab, other medicines, foods, dyes, or preservatives pregnant or trying to get pregnant breast-feeding How should I use this medication? This medicine is injected into a vein. It is given by a health care provider in a hospital or clinic setting. A special MedGuide will be given to you before each treatment. Be sure to read this information carefully each time. Talk to your health care provider about the use of this medicine in children. While this drug may be prescribed for children as young as 6 months for selected conditions, precautions do apply. Overdosage: If you think you have taken too much of this medicine contact a poison control center or emergency room at once. NOTE: This medicine is only for you. Do not share this medicine with others. What if I miss a dose? Keep appointments for follow-up doses. It is important not to miss your dose. Call your health care provider if you are unable to keep an appointment. What may interact with this medication? Do not take  this medicine with any of the following medicines: live vaccines This medicine may also interact with the following medicines: cisplatin This list may not describe all possible interactions. Give your health care provider a list of all the medicines, herbs, non-prescription drugs, or dietary supplements you use. Also tell them if you smoke, drink alcohol, or use illegal drugs. Some items may interact with your medicine. What should I watch for while using this medication? Your condition will be monitored carefully while you are receiving this medicine. You may need blood work done while you are taking this medicine. This medicine can cause serious infusion reactions. To reduce the risk your health care provider may give you other medicines to take before receiving this one. Be sure to follow the directions from your health care provider. This medicine may increase your risk of getting an infection. Call your health care provider for advice if you get a fever, chills, sore throat, or other symptoms of a cold or flu. Do not treat yourself. Try to avoid being around people who are sick. Call your health care provider if you are around anyone with measles, chickenpox, or if you develop sores or blisters that do not heal properly. Avoid taking medicines that contain aspirin, acetaminophen, ibuprofen, naproxen, or ketoprofen unless instructed by your health care provider. These medicines may hide a fever. This medicine may cause serious skin reactions. They can happen weeks to months after starting the medicine. Contact your health care provider right away if you notice fevers or flu-like symptoms with a rash. The rash may be red or purple and then turn into blisters or peeling of the skin. Or, you might  notice a red rash with swelling of the face, lips or lymph nodes in your neck or under your arms. In some patients, this medicine may cause a serious brain infection that may cause death. If you have any  problems seeing, thinking, speaking, walking, or standing, tell your healthcare professional right away. If you cannot reach your healthcare professional, urgently seek other source of medical care. Do not become pregnant while taking this medicine or for at least 12 months after stopping it. Women should inform their health care provider if they wish to become pregnant or think they might be pregnant. There is potential for serious harm to an unborn child. Talk to your health care provider for more information. Women should use a reliable form of birth control while taking this medicine and for 12 months after stopping it. Do not breast-feed while taking this medicine or for at least 6 months after stopping it. What side effects may I notice from receiving this medication? Side effects that you should report to your health care provider as soon as possible: allergic reactions (skin rash, itching or hives; swelling of the face, lips, or tongue) diarrhea edema (sudden weight gain; swelling of the ankles, feet, hands or other unusual swelling; trouble breathing) fast, irregular heartbeat heart attack (trouble breathing; pain or tightness in the chest, neck, back or arms; unusually weak or tired) infection (fever, chills, cough, sore throat, pain or trouble passing urine) kidney injury (trouble passing urine or change in the amount of urine) liver injury (dark yellow or brown urine; general ill feeling or flu-like symptoms; loss of appetite, right upper belly pain; unusually weak or tired, yellowing of the eyes or skin) low blood pressure (dizziness; feeling faint or lightheaded, falls; unusually weak or tired) low red blood cell counts (trouble breathing; feeling faint; lightheaded, falls; unusually weak or tired) mouth sores redness, blistering, peeling, or loosening of the skin, including inside the mouth stomach pain unusual bruising or bleeding wheezing (trouble breathing with loud or whistling  sounds) vomiting Side effects that usually do not require medical attention (report to your health care provider if they continue or are bothersome): headache joint pain muscle cramps, pain nausea This list may not describe all possible side effects. Call your doctor for medical advice about side effects. You may report side effects to FDA at 1-800-FDA-1088. Where should I keep my medication? This medicine is given in a hospital or clinic. It will not be stored at home. NOTE: This sheet is a summary. It may not cover all possible information. If you have questions about this medicine, talk to your doctor, pharmacist, or health care provider.  2022 Elsevier/Gold Standard (2020-02-18 00:00:00) Bendamustine Injection What is this medication? BENDAMUSTINE (BEN da MUS teen) is a chemotherapy drug. It is used to treat chronic lymphocytic leukemia and non-Hodgkin lymphoma. This medicine may be used for other purposes; ask your health care provider or pharmacist if you have questions. COMMON BRAND NAME(S): Kristine Royal, Treanda What should I tell my care team before I take this medication? They need to know if you have any of these conditions: infection (especially a virus infection such as chickenpox, cold sores, or herpes) kidney disease liver disease an unusual or allergic reaction to bendamustine, mannitol, other medicines, foods, dyes, or preservatives pregnant or trying to get pregnant breast-feeding How should I use this medication? This medicine is for infusion into a vein. It is given by a health care professional in a hospital or clinic setting. Talk to your  pediatrician regarding the use of this medicine in children. Special care may be needed. Overdosage: If you think you have taken too much of this medicine contact a poison control center or emergency room at once. NOTE: This medicine is only for you. Do not share this medicine with others. What if I miss a dose? It is  important not to miss your dose. Call your doctor or health care professional if you are unable to keep an appointment. What may interact with this medication? Do not take this medicine with any of the following medications: clozapine This medicine may also interact with the following medications: atazanavir cimetidine ciprofloxacin enoxacin fluvoxamine medicines for seizures like carbamazepine and phenobarbital mexiletine rifampin tacrine thiabendazole zileuton This list may not describe all possible interactions. Give your health care provider a list of all the medicines, herbs, non-prescription drugs, or dietary supplements you use. Also tell them if you smoke, drink alcohol, or use illegal drugs. Some items may interact with your medicine. What should I watch for while using this medication? This drug may make you feel generally unwell. This is not uncommon, as chemotherapy can affect healthy cells as well as cancer cells. Report any side effects. Continue your course of treatment even though you feel ill unless your doctor tells you to stop. You may need blood work done while you are taking this medicine. Call your doctor or healthcare provider for advice if you get a fever, chills or sore throat, or other symptoms of a cold or flu. Do not treat yourself. This drug decreases your body's ability to fight infections. Try to avoid being around people who are sick. This medicine may cause serious skin reactions. They can happen weeks to months after starting the medicine. Contact your healthcare provider right away if you notice fevers or flu-like symptoms with a rash. The rash may be red or purple and then turn into blisters or peeling of the skin. Or, you might notice a red rash with swelling of the face, lips or lymph nodes in your neck or under your arms. In some patients, this medicine may cause a serious brain infection that may cause death. If you have any problems seeing, thinking,  speaking, walking, or standing, tell your health care provider right away. If you cannot reach your health care provider, urgently seek other source of medical care. This medicine may increase your risk to bruise or bleed. Call your doctor or healthcare provider if you notice any unusual bleeding. Talk to your doctor about your risk of cancer. You may be more at risk for certain types of cancers if you take this medicine. This medicine may increase your risk of skin cancer. Check your skin for changes to moles or for new growths while taking this medicine. Call your health care provider if you notice any of these skin changes. Do not become pregnant while taking this medicine or for at least 6 months after stopping it. Women should inform their doctor if they wish to become pregnant or think they might be pregnant. Men should not father a child while taking this medicine and for at least 3 months after stopping it. There is a potential for serious side effects to an unborn child. Talk to your healthcare provider or pharmacist for more information. Do not breast-feed an infant while taking this medicine or for at least 1 week after stopping it. This medicine may make it more difficult to father a child. You should talk with your doctor or healthcare  provider if you are concerned about your fertility. What side effects may I notice from receiving this medication? Side effects that you should report to your doctor or health care professional as soon as possible: allergic reactions like skin rash, itching or hives, swelling of the face, lips, or tongue low blood counts - this medicine may decrease the number of white blood cells, red blood cells and platelets. You may be at increased risk for infections and bleeding. rash, fever, and swollen lymph nodes redness, blistering, peeling, or loosening of the skin, including inside the mouth signs of infection like fever or chills, cough, sore throat, pain or  difficulty passing urine signs of decreased platelets or bleeding like bruising, pinpoint red spots on the skin, black, tarry stools, blood in the urine signs of decreased red blood cells like being unusually weak or tired, fainting spells, lightheadedness signs and symptoms of kidney injury like trouble passing urine or change in the amount of urine signs and symptoms of liver injury like dark yellow or brown urine; general ill feeling or flu-like symptoms; light-colored stools; loss of appetite; nausea; right upper belly pain; unusually weak or tired; yellowing of the eyes or skin Side effects that usually do not require medical attention (report to your doctor or health care professional if they continue or are bothersome): constipation decreased appetite diarrhea headache mouth sores nausea, vomiting tiredness This list may not describe all possible side effects. Call your doctor for medical advice about side effects. You may report side effects to FDA at 1-800-FDA-1088. Where should I keep my medication? This drug is given in a hospital or clinic and will not be stored at home. NOTE: This sheet is a summary. It may not cover all possible information. If you have questions about this medicine, talk to your doctor, pharmacist, or health care provider.  2022 [employer]vier/Gold Standard (2019-08-14 00:00:00)

## 2021-04-23 ENCOUNTER — Inpatient Hospital Stay: Payer: Medicare PPO

## 2021-04-23 ENCOUNTER — Other Ambulatory Visit: Payer: Self-pay

## 2021-04-23 VITALS — BP 183/75 | HR 81 | Temp 97.8°F | Resp 18 | Ht 65.0 in | Wt 164.5 lb

## 2021-04-23 DIAGNOSIS — Z5112 Encounter for antineoplastic immunotherapy: Secondary | ICD-10-CM | POA: Diagnosis not present

## 2021-04-23 DIAGNOSIS — C8309 Small cell B-cell lymphoma, extranodal and solid organ sites: Secondary | ICD-10-CM

## 2021-04-23 LAB — MULTIPLE MYELOMA PANEL, SERUM
Albumin SerPl Elph-Mcnc: 3.6 g/dL (ref 2.9–4.4)
Albumin/Glob SerPl: 1.6 (ref 0.7–1.7)
Alpha 1: 0.3 g/dL (ref 0.0–0.4)
Alpha2 Glob SerPl Elph-Mcnc: 0.5 g/dL (ref 0.4–1.0)
B-Globulin SerPl Elph-Mcnc: 0.8 g/dL (ref 0.7–1.3)
Gamma Glob SerPl Elph-Mcnc: 0.8 g/dL (ref 0.4–1.8)
Globulin, Total: 2.3 g/dL (ref 2.2–3.9)
IgA: 8 mg/dL — ABNORMAL LOW (ref 64–422)
IgG (Immunoglobin G), Serum: 777 mg/dL (ref 586–1602)
IgM (Immunoglobulin M), Srm: 6 mg/dL — ABNORMAL LOW (ref 26–217)
M Protein SerPl Elph-Mcnc: 0.5 g/dL — ABNORMAL HIGH
Total Protein ELP: 5.9 g/dL — ABNORMAL LOW (ref 6.0–8.5)

## 2021-04-23 MED ORDER — SODIUM CHLORIDE 0.9 % IV SOLN: Freq: Once | INTRAVENOUS | Status: AC

## 2021-04-23 MED ORDER — SODIUM CHLORIDE 0.9% FLUSH
10.0000 mL | INTRAVENOUS | Status: DC | PRN
  Administered 2021-04-23: 10 mL

## 2021-04-23 MED ORDER — SODIUM CHLORIDE 0.9 % IV SOLN
72.0000 mg/m2 | Freq: Once | INTRAVENOUS | Status: AC
  Administered 2021-04-23: 125 mg via INTRAVENOUS
  Filled 2021-04-23: qty 5

## 2021-04-23 MED ORDER — SODIUM CHLORIDE 0.9 % IV SOLN
10.0000 mg | Freq: Once | INTRAVENOUS | Status: AC
  Administered 2021-04-23: 10 mg via INTRAVENOUS
  Filled 2021-04-23: qty 10

## 2021-04-23 MED ORDER — HEPARIN SOD (PORK) LOCK FLUSH 100 UNIT/ML IV SOLN
500.0000 [IU] | Freq: Once | INTRAVENOUS | Status: AC | PRN
  Administered 2021-04-23: 500 [IU]

## 2021-04-23 NOTE — Patient Instructions (Signed)
Bendamustine Injection °What is this medication? °BENDAMUSTINE (BEN da MUS teen) is a chemotherapy drug. It is used to treat chronic lymphocytic leukemia and non-Hodgkin lymphoma. °This medicine may be used for other purposes; ask your health care provider or pharmacist if you have questions. °COMMON BRAND NAME(S): BELRAPZO, BENDEKA, Treanda °What should I tell my care team before I take this medication? °They need to know if you have any of these conditions: °infection (especially a virus infection such as chickenpox, cold sores, or herpes) °kidney disease °liver disease °an unusual or allergic reaction to bendamustine, mannitol, other medicines, foods, dyes, or preservatives °pregnant or trying to get pregnant °breast-feeding °How should I use this medication? °This medicine is for infusion into a vein. It is given by a health care professional in a hospital or clinic setting. °Talk to your pediatrician regarding the use of this medicine in children. Special care may be needed. °Overdosage: If you think you have taken too much of this medicine contact a poison control center or emergency room at once. °NOTE: This medicine is only for you. Do not share this medicine with others. °What if I miss a dose? °It is important not to miss your dose. Call your doctor or health care professional if you are unable to keep an appointment. °What may interact with this medication? °Do not take this medicine with any of the following medications: °clozapine °This medicine may also interact with the following medications: °atazanavir °cimetidine °ciprofloxacin °enoxacin °fluvoxamine °medicines for seizures like carbamazepine and phenobarbital °mexiletine °rifampin °tacrine °thiabendazole °zileuton °This list may not describe all possible interactions. Give your health care provider a list of all the medicines, herbs, non-prescription drugs, or dietary supplements you use. Also tell them if you smoke, drink alcohol, or use illegal  drugs. Some items may interact with your medicine. °What should I watch for while using this medication? °This drug may make you feel generally unwell. This is not uncommon, as chemotherapy can affect healthy cells as well as cancer cells. Report any side effects. Continue your course of treatment even though you feel ill unless your doctor tells you to stop. °You may need blood work done while you are taking this medicine. °Call your doctor or healthcare provider for advice if you get a fever, chills or sore throat, or other symptoms of a cold or flu. Do not treat yourself. This drug decreases your body's ability to fight infections. Try to avoid being around people who are sick. °This medicine may cause serious skin reactions. They can happen weeks to months after starting the medicine. Contact your healthcare provider right away if you notice fevers or flu-like symptoms with a rash. The rash may be red or purple and then turn into blisters or peeling of the skin. Or, you might notice a red rash with swelling of the face, lips or lymph nodes in your neck or under your arms. °In some patients, this medicine may cause a serious brain infection that may cause death. If you have any problems seeing, thinking, speaking, walking, or standing, tell your health care provider right away. If you cannot reach your health care provider, urgently seek other source of medical care. °This medicine may increase your risk to bruise or bleed. Call your doctor or healthcare provider if you notice any unusual bleeding. °Talk to your doctor about your risk of cancer. You may be more at risk for certain types of cancers if you take this medicine. °This medicine may increase your risk of skin   cancer. Check your skin for changes to moles or for new growths while taking this medicine. Call your health care provider if you notice any of these skin changes. °Do not become pregnant while taking this medicine or for at least 6 months after  stopping it. Women should inform their doctor if they wish to become pregnant or think they might be pregnant. Men should not father a child while taking this medicine and for at least 3 months after stopping it. There is a potential for serious side effects to an unborn child. Talk to your healthcare provider or pharmacist for more information. Do not breast-feed an infant while taking this medicine or for at least 1 week after stopping it. °This medicine may make it more difficult to father a child. You should talk with your doctor or healthcare provider if you are concerned about your fertility. °What side effects may I notice from receiving this medication? °Side effects that you should report to your doctor or health care professional as soon as possible: °allergic reactions like skin rash, itching or hives, swelling of the face, lips, or tongue °low blood counts - this medicine may decrease the number of white blood cells, red blood cells and platelets. You may be at increased risk for infections and bleeding. °rash, fever, and swollen lymph nodes °redness, blistering, peeling, or loosening of the skin, including inside the mouth °signs of infection like fever or chills, cough, sore throat, pain or difficulty passing urine °signs of decreased platelets or bleeding like bruising, pinpoint red spots on the skin, black, tarry stools, blood in the urine °signs of decreased red blood cells like being unusually weak or tired, fainting spells, lightheadedness °signs and symptoms of kidney injury like trouble passing urine or change in the amount of urine °signs and symptoms of liver injury like dark yellow or brown urine; general ill feeling or flu-like symptoms; light-colored stools; loss of appetite; nausea; right upper belly pain; unusually weak or tired; yellowing of the eyes or skin °Side effects that usually do not require medical attention (report to your doctor or health care professional if they continue or  are bothersome): °constipation °decreased appetite °diarrhea °headache °mouth sores °nausea, vomiting °tiredness °This list may not describe all possible side effects. Call your doctor for medical advice about side effects. You may report side effects to FDA at 1-800-FDA-1088. °Where should I keep my medication? °This drug is given in a hospital or clinic and will not be stored at home. °NOTE: This sheet is a summary. It may not cover all possible information. If you have questions about this medicine, talk to your doctor, pharmacist, or health care provider. °© 2022 [employer]/Gold Standard (2019-08-14 00:00:00) ° °

## 2021-04-24 ENCOUNTER — Inpatient Hospital Stay: Payer: Medicare PPO

## 2021-04-24 VITALS — BP 130/48 | HR 58 | Temp 97.8°F | Resp 18 | Ht 65.0 in | Wt 170.1 lb

## 2021-04-24 DIAGNOSIS — Z5112 Encounter for antineoplastic immunotherapy: Secondary | ICD-10-CM | POA: Diagnosis not present

## 2021-04-24 DIAGNOSIS — C8309 Small cell B-cell lymphoma, extranodal and solid organ sites: Secondary | ICD-10-CM

## 2021-04-24 MED ORDER — PEGFILGRASTIM-CBQV 6 MG/0.6ML ~~LOC~~ SOSY
6.0000 mg | PREFILLED_SYRINGE | Freq: Once | SUBCUTANEOUS | Status: AC
  Administered 2021-04-24: 6 mg via SUBCUTANEOUS
  Filled 2021-04-24: qty 0.6

## 2021-05-18 ENCOUNTER — Telehealth: Payer: Self-pay

## 2021-05-18 NOTE — Telephone Encounter (Signed)
I spoke with pt. She prefers to get labs and see Melissa tomorrow. Appts made & given for tomorrow. ? ? RE: Very fatigued, SOB w/minimal exertion ? ?Received: Today ?[name], NP  Dairl Ponder, RN ?She can come in for lab check if she wants or I can see her, just not today.  ? ?Pt called to ask if it is normal to be so tired after this last & final treatment? She is very tired, and gets SOB w/minimal exertion. It is taking longer than usual to bounce back.  ? ?

## 2021-05-19 ENCOUNTER — Other Ambulatory Visit: Payer: Self-pay

## 2021-05-19 ENCOUNTER — Other Ambulatory Visit: Payer: Medicare PPO

## 2021-05-19 ENCOUNTER — Other Ambulatory Visit: Payer: Self-pay | Admitting: Oncology

## 2021-05-19 ENCOUNTER — Inpatient Hospital Stay: Payer: Medicare PPO | Admitting: Oncology

## 2021-05-19 ENCOUNTER — Ambulatory Visit: Payer: Medicare PPO | Admitting: Hematology and Oncology

## 2021-05-19 ENCOUNTER — Telehealth: Payer: Self-pay

## 2021-05-19 ENCOUNTER — Inpatient Hospital Stay: Payer: Medicare PPO | Attending: Oncology

## 2021-05-19 VITALS — BP 160/71 | HR 70 | Temp 98.3°F | Resp 16 | Ht 65.0 in | Wt 164.2 lb

## 2021-05-19 DIAGNOSIS — C8208 Follicular lymphoma grade I, lymph nodes of multiple sites: Secondary | ICD-10-CM | POA: Diagnosis not present

## 2021-05-19 DIAGNOSIS — C8309 Small cell B-cell lymphoma, extranodal and solid organ sites: Secondary | ICD-10-CM

## 2021-05-19 DIAGNOSIS — Z79899 Other long term (current) drug therapy: Secondary | ICD-10-CM | POA: Insufficient documentation

## 2021-05-19 DIAGNOSIS — D649 Anemia, unspecified: Secondary | ICD-10-CM | POA: Diagnosis not present

## 2021-05-19 DIAGNOSIS — C8510 Unspecified B-cell lymphoma, unspecified site: Secondary | ICD-10-CM | POA: Insufficient documentation

## 2021-05-19 LAB — IRON AND TIBC
Iron: 73 ug/dL (ref 28–170)
Saturation Ratios: 35 % — ABNORMAL HIGH (ref 10.4–31.8)
TIBC: 211 ug/dL — ABNORMAL LOW (ref 250–450)
UIBC: 138 ug/dL

## 2021-05-19 LAB — BASIC METABOLIC PANEL
BUN: 11 (ref 4–21)
CO2: 30 — AB (ref 13–22)
Chloride: 109 — AB (ref 99–108)
Creatinine: 0.7 (ref 0.5–1.1)
Glucose: 115
Potassium: 3.4 mEq/L — AB (ref 3.5–5.1)
Sodium: 141 (ref 137–147)

## 2021-05-19 LAB — HEPATIC FUNCTION PANEL
ALT: 25 U/L (ref 7–35)
AST: 45 — AB (ref 13–35)
Alkaline Phosphatase: 64 (ref 25–125)
Bilirubin, Total: 1.3

## 2021-05-19 LAB — FERRITIN: Ferritin: 234 ng/mL (ref 11–307)

## 2021-05-19 LAB — CBC AND DIFFERENTIAL
HCT: 26 — AB (ref 36–46)
Hemoglobin: 8.2 — AB (ref 12.0–16.0)
Neutrophils Absolute: 1.92
Platelets: 116 10*3/uL — AB (ref 150–400)
WBC: 3.1

## 2021-05-19 LAB — COMPREHENSIVE METABOLIC PANEL
Albumin: 2.9 — AB (ref 3.5–5.0)
Calcium: 8.4 — AB (ref 8.7–10.7)

## 2021-05-19 LAB — FOLATE: Folate: 11.5 ng/mL (ref >5.9)

## 2021-05-19 LAB — VITAMIN B12: Vitamin B-12: 1309 pg/mL — ABNORMAL HIGH (ref 180–914)

## 2021-05-19 LAB — CBC: RBC: 3.01 — AB (ref 3.87–5.11)

## 2021-05-19 NOTE — Telephone Encounter (Addendum)
05/20/21 Dr Bobby Rumpf reviewed all of pt's labs. He states, "her labs are all normal. She does not have any nutritional deficiencies. Ask if she would like a blood transfusion, as discussed yesterday".  I called pt back and she does want a blood transfusion. ? ?05/19/21 The lab results are not back as of yet. Pt is to call me in the am for results. ?

## 2021-05-20 ENCOUNTER — Other Ambulatory Visit: Payer: Self-pay | Admitting: Oncology

## 2021-05-20 ENCOUNTER — Inpatient Hospital Stay: Payer: Medicare PPO

## 2021-05-20 ENCOUNTER — Encounter: Payer: Self-pay | Admitting: Oncology

## 2021-05-20 DIAGNOSIS — C8200 Follicular lymphoma grade I, unspecified site: Secondary | ICD-10-CM

## 2021-05-20 NOTE — Progress Notes (Signed)
?Alisha Cochran  ?[address] ?[place],  [place]  [address] ?[phone] ? ?Clinic Day:  05/19/2021 ? ?Referring physician: Algis Greenhouse, MD ? ?This document serves as a record of services personally performed by Alisha Potter, MD. It was created on their behalf by Curry,Lauren E, a trained medical scribe. The creation of this record is based on the scribe's personal observations and the provider's statements to them. ? 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  She just finished her 6 cycles of Bendamustine/Rituxan in February 2023.  She comes in off-schedule today due to feeling extremely weak.  Over the past month, she claims her energy level has been very poor to where she spends much of the day sitting/lying down.  She denies having any overt forms of blood loss.   ? ?PHYSICAL EXAM:  ?Blood pressure (!) 160/71, pulse 70, temperature 98.3 ?F (36.8 ?C), resp. rate 16, height '5\' 5"'$  (1.651 m), weight 164 lb 3.2 oz (74.5 kg), SpO2 100 %. ?Wt Readings from Last 3 Encounters:  ?05/19/21 164 lb 3.2 oz (74.5 kg)  ?04/24/21 170 lb 1.9 oz (77.2 kg)  ?04/23/21 164 lb 8 oz (74.6 kg)  ? ?Body mass index is 27.32 kg/m?Marland Kitchen ?Performance status (ECOG): 1 - Symptomatic but completely ambulatory ?Physical Exam ?Constitutional:   ?   Appearance: Normal appearance. She is not ill-appearing.  ?HENT:  ?   Mouth/Throat:  ?   Mouth: Mucous membranes are moist.  ?   Pharynx: Oropharynx is clear. No oropharyngeal exudate or posterior oropharyngeal erythema.  ?Cardiovascular:  ?   Rate and Rhythm: Normal rate and regular rhythm.  ?   Heart sounds: No murmur heard. ?  No friction rub. No gallop.  ?Pulmonary:  ?   Effort: Pulmonary effort is normal. No respiratory distress.  ?   Breath sounds: Normal breath sounds. No wheezing, rhonchi or rales.  ?Abdominal:  ?   General: Bowel sounds are normal. There is no distension.  ?   Palpations: Abdomen is soft.  There is no mass.  ?   Tenderness: There is no abdominal tenderness.  ?Musculoskeletal:     ?   General: No swelling.  ?   Right lower leg: No edema.  ?   Left lower leg: No edema.  ?Lymphadenopathy:  ?   Cervical: No cervical adenopathy.  ?   Upper Body:  ?   Right upper body: No supraclavicular or axillary adenopathy.  ?   Left upper body: No supraclavicular or axillary adenopathy.  ?   Lower Body: No right inguinal adenopathy. No left inguinal adenopathy.  ?Skin: ?   General: Skin is warm.  ?   Coloration: Skin is not jaundiced.  ?   Findings: No lesion or rash.  ?Neurological:  ?   General: No focal deficit present.  ?   Mental Status: She is alert and oriented to person, place, and time. Mental status is at baseline.  ?Psychiatric:     ?   Mood and Affect: Mood normal.     ?   Behavior: Behavior normal.     ?   Thought Content: Thought content normal.  ? ?LABS: ? Latest Reference Range & Units 05/19/21 00:00  ?WBC  3.1 (E)  ?RBC 3.87 - 5.11  3.01 ! (E)  ?Hemoglobin 12.0 - 16.0  8.2 ! (E)  ?HCT 36 - 46  26 ! (E)  ?Platelets 150 -  400 K/uL 116 ! (E)  ?NEUT#  1.92 (E)  ?!: Data is abnormal ?(E): External lab result ? 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  When evaluating her labs today, her hemoglobin of 8.2 is the lowest it has been since I have been following her.  This is likely related to the chemotherapy she recently completed.  Her other labs today did not show any evidence of a nutritional deficiency factoring into her anemia.  Per her request, I will arrange for  her to be transfused 2 units of blood this week.  She is already scheduled to see me back in April to reassess her small B-cell lymphoma.  Her hemoglobin will also be reassessed at that time.  The patient understands all the plans discussed today and is in agreement with them.   ? ?Alisha Wickliffe Macarthur Critchley, MD ? ? ? ? ?

## 2021-06-09 ENCOUNTER — Telehealth: Payer: Self-pay | Admitting: Oncology

## 2021-06-09 NOTE — Telephone Encounter (Signed)
CT C/A/P has been scheduled for 06/18/21 @ 11 am ; Check in @ 10:45 am ? ?LVM notifying pt of date,time and instructions.  ?

## 2021-06-15 ENCOUNTER — Inpatient Hospital Stay: Payer: Medicare PPO | Attending: Oncology

## 2021-06-15 ENCOUNTER — Encounter: Payer: Self-pay | Admitting: Oncology

## 2021-06-15 DIAGNOSIS — C8300 Small cell B-cell lymphoma, unspecified site: Secondary | ICD-10-CM | POA: Insufficient documentation

## 2021-06-15 DIAGNOSIS — Z862 Personal history of diseases of the blood and blood-forming organs and certain disorders involving the immune mechanism: Secondary | ICD-10-CM | POA: Diagnosis not present

## 2021-06-15 DIAGNOSIS — Z9221 Personal history of antineoplastic chemotherapy: Secondary | ICD-10-CM | POA: Diagnosis not present

## 2021-06-15 DIAGNOSIS — D472 Monoclonal gammopathy: Secondary | ICD-10-CM

## 2021-06-15 LAB — CBC AND DIFFERENTIAL
HCT: 29 — AB (ref 36–46)
Hemoglobin: 9.8 — AB (ref 12.0–16.0)
Neutrophils Absolute: 2.11
Platelets: 150 10*3/uL (ref 150–400)
WBC: 3.4

## 2021-06-15 LAB — BASIC METABOLIC PANEL
BUN: 12 (ref 4–21)
CO2: 26 — AB (ref 13–22)
Chloride: 107 (ref 99–108)
Creatinine: 0.7 (ref 0.5–1.1)
Glucose: 109
Potassium: 3.7 mEq/L (ref 3.5–5.1)
Sodium: 141 (ref 137–147)

## 2021-06-15 LAB — HEPATIC FUNCTION PANEL
ALT: 26 U/L (ref 7–35)
AST: 38 — AB (ref 13–35)
Alkaline Phosphatase: 69 (ref 25–125)
Bilirubin, Total: 1.1

## 2021-06-15 LAB — COMPREHENSIVE METABOLIC PANEL
Albumin: 3.9 (ref 3.5–5.0)
Calcium: 8.9 (ref 8.7–10.7)

## 2021-06-15 LAB — CBC: RBC: 3.37 — AB (ref 3.87–5.11)

## 2021-06-16 ENCOUNTER — Encounter: Payer: Self-pay | Admitting: Oncology

## 2021-06-16 LAB — KAPPA/LAMBDA LIGHT CHAINS
Kappa free light chain: 144.8 mg/L — ABNORMAL HIGH (ref 3.3–19.4)
Kappa, lambda light chain ratio: 21.94 — ABNORMAL HIGH (ref 0.26–1.65)
Lambda free light chains: 6.6 mg/L (ref 5.7–26.3)

## 2021-06-17 LAB — MULTIPLE MYELOMA PANEL, SERUM
Albumin SerPl Elph-Mcnc: 3.7 g/dL (ref 2.9–4.4)
Albumin/Glob SerPl: 1.9 — ABNORMAL HIGH (ref 0.7–1.7)
Alpha 1: 0.2 g/dL (ref 0.0–0.4)
Alpha2 Glob SerPl Elph-Mcnc: 0.5 g/dL (ref 0.4–1.0)
B-Globulin SerPl Elph-Mcnc: 0.7 g/dL (ref 0.7–1.3)
Gamma Glob SerPl Elph-Mcnc: 0.7 g/dL (ref 0.4–1.8)
Globulin, Total: 2 g/dL — ABNORMAL LOW (ref 2.2–3.9)
IgA: 6 mg/dL — ABNORMAL LOW (ref 64–422)
IgG (Immunoglobin G), Serum: 701 mg/dL (ref 586–1602)
IgM (Immunoglobulin M), Srm: 6 mg/dL — ABNORMAL LOW (ref 26–217)
M Protein SerPl Elph-Mcnc: 0.5 g/dL — ABNORMAL HIGH
Total Protein ELP: 5.7 g/dL — ABNORMAL LOW (ref 6.0–8.5)

## 2021-06-19 ENCOUNTER — Inpatient Hospital Stay: Payer: Medicare PPO | Admitting: Oncology

## 2021-06-19 ENCOUNTER — Other Ambulatory Visit: Payer: Self-pay

## 2021-06-19 VITALS — BP 137/63 | HR 66 | Temp 97.5°F | Resp 16 | Ht 65.0 in | Wt 162.8 lb

## 2021-06-19 DIAGNOSIS — C8200 Follicular lymphoma grade I, unspecified site: Secondary | ICD-10-CM | POA: Diagnosis not present

## 2021-06-20 NOTE — Progress Notes (Signed)
?Alisha Cochran  ?[address] ?[place],  [place]  [address] ?[phone] ? ?Clinic Day:  06/19/2021 ? ?Referring physician: Algis Greenhouse, MD ? ? 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  She comes in today to go over her labs and scans to ascertain her new disease baseline after the completion of therapy.  Overall, the patient claims to be feeling much better, particularly active she received a blood transfusion for anemia last month.  She denies having any new symptoms or findings which concern her for early disease recurrence.  ? ?PHYSICAL EXAM:  ?Blood pressure 137/63, pulse 66, temperature (!) 97.5 ?F (36.4 ?C), resp. rate 16, height '5\' 5"'  (1.651 m), weight 162 lb 12.8 oz (73.8 kg), SpO2 98 %. ?Wt Readings from Last 3 Encounters:  ?06/19/21 162 lb 12.8 oz (73.8 kg)  ?05/19/21 164 lb 3.2 oz (74.5 kg)  ?04/24/21 170 lb 1.9 oz (77.2 kg)  ? ?Body mass index is 27.09 kg/m?Marland Kitchen ?Performance status (ECOG): 1 - Symptomatic but completely ambulatory ?Physical Exam ?Constitutional:   ?   Appearance: Normal appearance. She is not ill-appearing.  ?HENT:  ?   Mouth/Throat:  ?   Mouth: Mucous membranes are moist.  ?   Pharynx: Oropharynx is clear. No oropharyngeal exudate or posterior oropharyngeal erythema.  ?Cardiovascular:  ?   Rate and Rhythm: Normal rate and regular rhythm.  ?   Heart sounds: No murmur heard. ?  No friction rub. No gallop.  ?Pulmonary:  ?   Effort: Pulmonary effort is normal. No respiratory distress.  ?   Breath sounds: Normal breath sounds. No wheezing, rhonchi or rales.  ?Abdominal:  ?   General: Bowel sounds are normal. There is no distension.  ?   Palpations: Abdomen is soft. There is no mass.  ?   Tenderness: There is no abdominal tenderness.  ?Musculoskeletal:     ?   General: No swelling.  ?   Right lower leg: No edema.  ?   Left lower leg: No  edema.  ?Lymphadenopathy:  ?   Cervical: No cervical adenopathy.  ?   Upper Body:  ?   Right upper body: No supraclavicular or axillary adenopathy.  ?   Left upper body: No supraclavicular or axillary adenopathy.  ?   Lower Body: No right inguinal adenopathy. No left inguinal adenopathy.  ?Skin: ?   General: Skin is warm.  ?   Coloration: Skin is not jaundiced.  ?   Findings: No lesion or rash.  ?Neurological:  ?   General: No focal deficit present.  ?   Mental Status: She is alert and oriented to person, place, and time. Mental status is at baseline.  ?Psychiatric:     ?   Mood and Affect: Mood normal.     ?   Behavior: Behavior normal.     ?   Thought Content: Thought content normal.  ? ?SCANS: CT scans of his chest/abdomen/pelvis revealed the following: ?FINDINGS:  ?CT CHEST FINDINGS  ?Cardiovascular: Atherosclerosis of the aorta, great vessels and  ?coronary arteries again noted. No acute vascular findings. Right IJ  ?Port-A-Cath extends to the superior cavoatrial junction. The heart  ?size is normal. There is no pericardial effusion.  ?Mediastinum/Nodes: There are no enlarged mediastinal, hilar or  ?axillary lymph nodes. Stable small hiatal hernia. The thyroid gland  ?and trachea demonstrate no significant  findings.  ?Lungs/Pleura: There is no pleural effusion. Stable  ?well-circumscribed peribronchovascular nodule in the right lower  ?lobe measuring 5 mm on image 71/301. No new or enlarging pulmonary  ?nodules. The lungs are otherwise clear.  ?Musculoskeletal/Chest wall: No chest wall mass or suspicious osseous  ?findings. Mild spondylosis.  ? ?CT ABDOMEN AND PELVIS FINDINGS  ?Hepatobiliary: The liver appears stable without suspicious findings.  ?Small low-density lesions are unchanged, likely cysts. No biliary  ?dilatation status post cholecystectomy.  ?Pancreas: Unremarkable. No pancreatic ductal dilatation or  ?surrounding inflammatory changes.  ?Spleen: Stable mild splenomegaly. No focal splenic lesion.   ?Adrenals/Urinary Tract: Both adrenal glands appear normal. The  ?kidneys appear normal without evidence of urinary tract calculus,  ?suspicious lesion or hydronephrosis. No bladder abnormalities are  ?seen.  ?Stomach/Bowel: Enteric contrast was administered and has passed into  ?the distal colon. The stomach appears unremarkable for its degree of  ?distention. The small bowel appears normal. There are chronic  ?diverticular changes of the descending and sigmoid colon with mild  ?wall thickening, similar to previous study. No definite acute  ?inflammatory changes.  ?Vascular/Lymphatic: There are no enlarged abdominal or pelvic lymph  ?nodes. Aortic and branch vessel atherosclerosis without acute  ?vascular findings. The portal, superior mesenteric and splenic veins  ?are patent.  ?Reproductive: Hysterectomy. The adnexa appear unchanged.  ?Other: No evidence of abdominal wall mass or hernia. No ascites.  ?Musculoskeletal: No acute or significant osseous findings. Convex  ?right lumbar scoliosis with associated spondylosis.  ? ?IMPRESSION:  ?1. Stable CTs of the chest, abdomen and pelvis. No recurrent  ?lymphadenopathy identified.  ?2. Stable mild splenomegaly without focal abnormality.  ?3. Stable well-circumscribed peribronchovascular nodule at the right  ?lung base, possibly a lymph node based on stability. Fleischner  ?criteria do not apply given the patient's history. Attention on  ?routine follow-up suggested.  ?4. Similar appearance of distal colonic diverticulosis without  ?evidence of acute inflammation.  ?5. Coronary and Aortic Atherosclerosis (ICD10-I70.0).  ? ?LABS: ? Latest Reference Range & Units 06/15/21 00:00  ?WBC  3.4 (E)  ?RBC 3.87 - 5.11  3.37 ! (E)  ?Hemoglobin 12.0 - 16.0  9.8 ! (E)  ?HCT 36 - 46  29 ! (E)  ?Platelets 150 - 400 K/uL 150 (E)  ?NEUT#  2.11 (E)  ?!: Data is abnormal ?(E): External lab result ? Latest Reference Range & Units 06/15/21 00:00  ?Sodium 137 - 147  141 (E)  ?Potassium 3.5  - 5.1 mEq/L 3.7 (E)  ?Chloride 99 - 108  107 (E)  ?CO2 13 - 22  26 ! (E)  ?Glucose  109 (E)  ?BUN 4 - 21  12 (E)  ?Creatinine 0.5 - 1.1  0.7 (E)  ?Calcium 8.7 - 10.7  8.9 (E)  ?Alkaline Phosphatase 25 - 125  69 (E)  ?Albumin 3.5 - 5.0  3.9 (E)  ?AST 13 - 35  38 ! (E)  ?ALT 7 - 35 U/L 26 (E)  ?Bilirubin, Total  1.1 (E)  ?!: Data is abnormal ?(E): External lab result ? ? ? ? ? ? ?ASSESSMENT & PLAN:  ?Assessment/Plan:  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  In clinic today, I went over her CT scan images with her, for which she could see she no longer has any lymphadenopathy which is concerning for radiographically persistent lymphoma.  Of note, the reason her lymphoma came to the forefront was due to abnormal  monoclonal parameters which initially suggested the presence of multiple myeloma.  However, her bone marrow biopsy proved otherwise.  Although her monoclonal parameters have not completely dissipated, there has at least been a 90% reduction in her IgG, M spike, and kappa light chain levels, which is a reflection of the positive disease response she had from her 6 cycles of Bendamustine/Rituxan.  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  With respect to her disease surveillance, I will follow her with serial CBCs, monoclonal parameter studies, and physical exams to ensure there remains no evidence of disease progression that would warrant repeat therapy.  Her next visit will be in 4 months.  The patient understands all the plans discussed today and is in agreement with them.   ? ?Donnamarie Shankles Macarthur Critchley, MD ? ? ? ? ?

## 2021-06-24 ENCOUNTER — Ambulatory Visit: Payer: Medicare PPO | Admitting: Cardiology

## 2021-06-24 ENCOUNTER — Encounter: Payer: Self-pay | Admitting: Cardiology

## 2021-06-24 VITALS — BP 134/52 | HR 64 | Ht 65.0 in | Wt 165.4 lb

## 2021-06-24 DIAGNOSIS — E782 Mixed hyperlipidemia: Secondary | ICD-10-CM | POA: Diagnosis not present

## 2021-06-24 DIAGNOSIS — I10 Essential (primary) hypertension: Secondary | ICD-10-CM

## 2021-06-24 DIAGNOSIS — I7090 Unspecified atherosclerosis: Secondary | ICD-10-CM | POA: Diagnosis not present

## 2021-06-24 IMAGING — MG DIGITAL SCREENING BILAT W/ TOMO W/ CAD
8 series · 8 of 24 positions shown · non-contrast
Comparison: Previous exam(s).

CLINICAL DATA: Screening.

EXAM:
DIGITAL SCREENING BILATERAL MAMMOGRAM WITH TOMO AND CAD

[R MLO synth-2D]
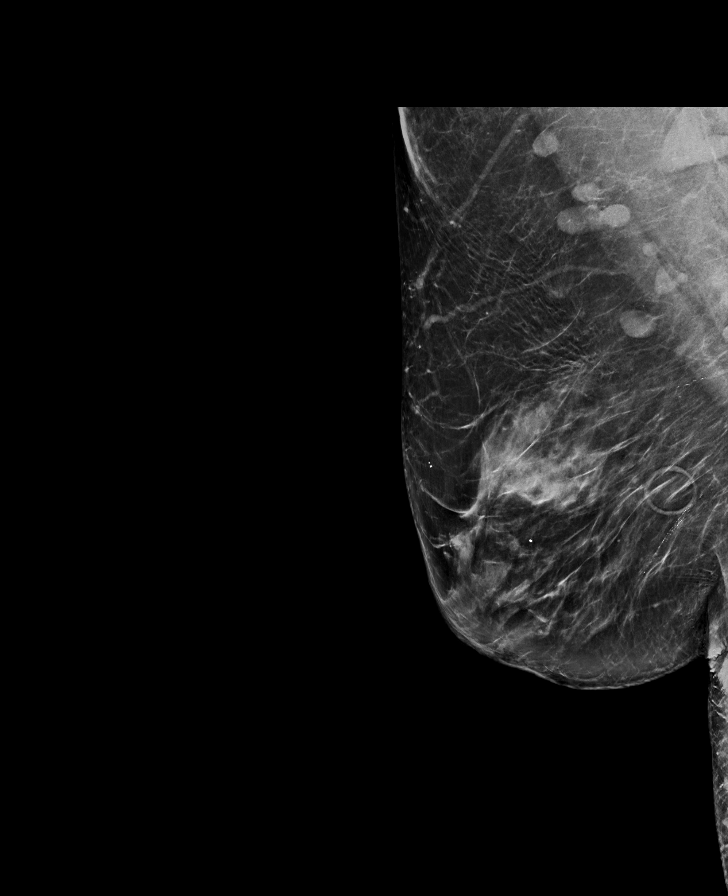

[L MLO synth-2D]
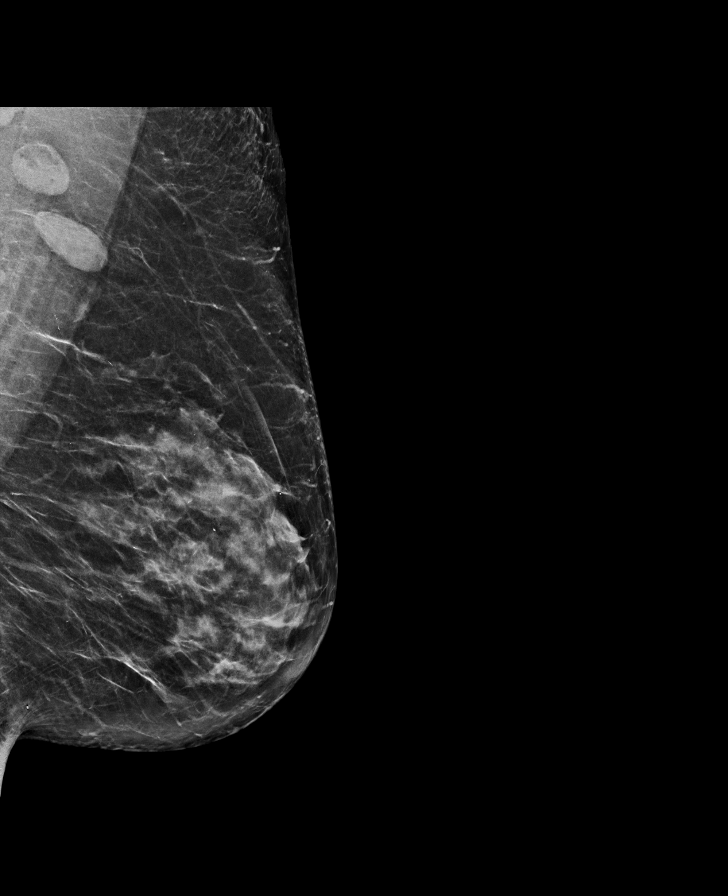

[L CC synth-2D]
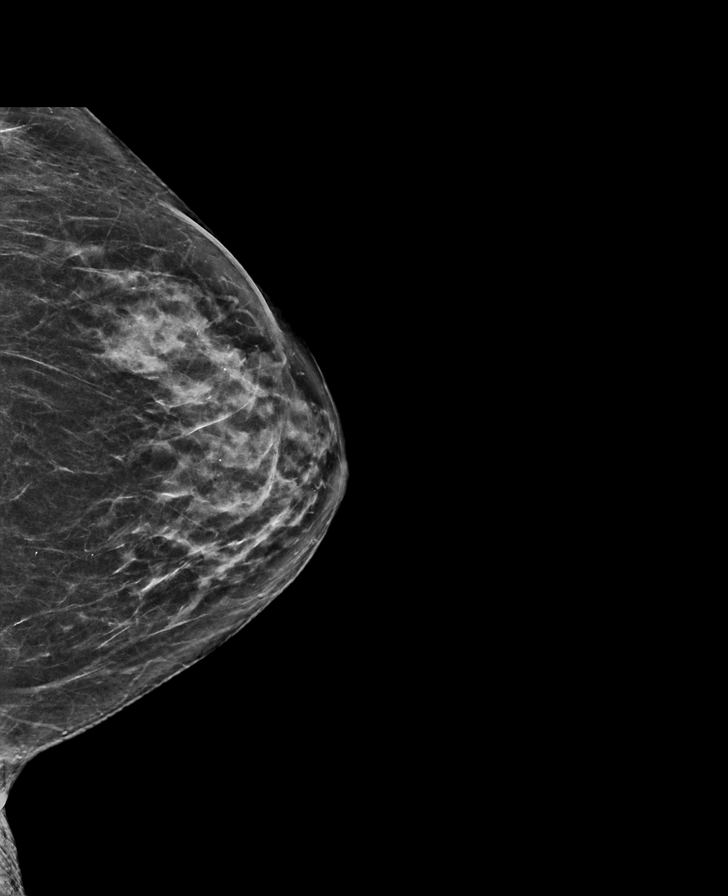

[R CC synth-2D]
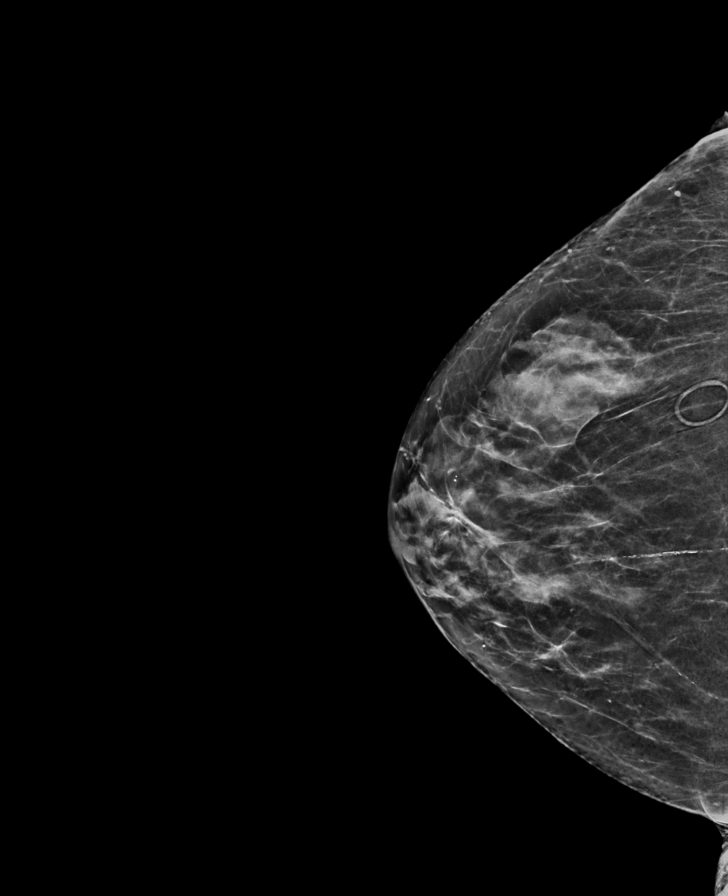

[R MLO tomo · tomo slice 39/77.0]
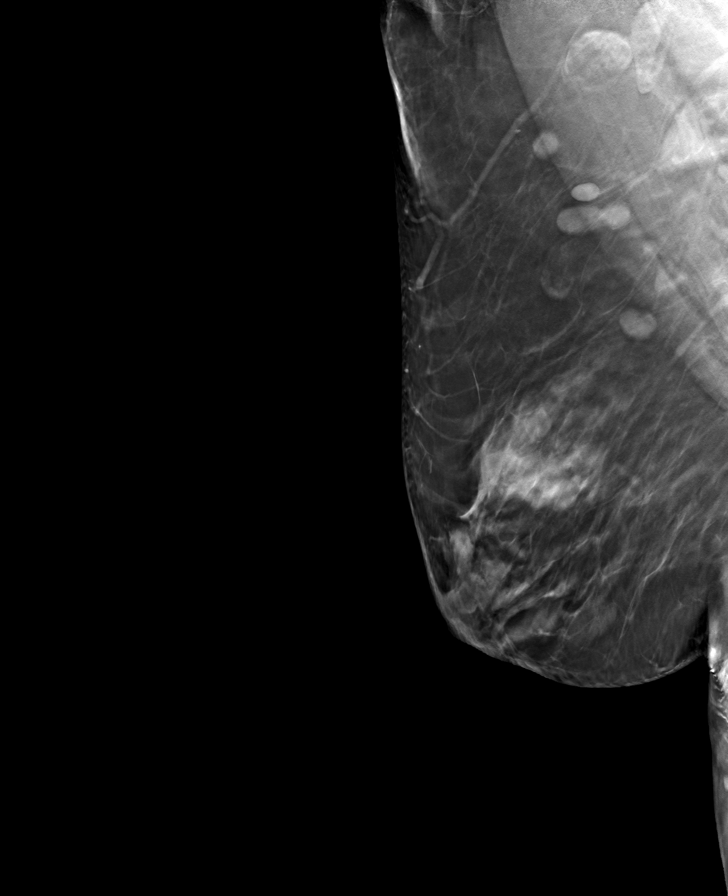

[R CC tomo · tomo slice 29/57.0]
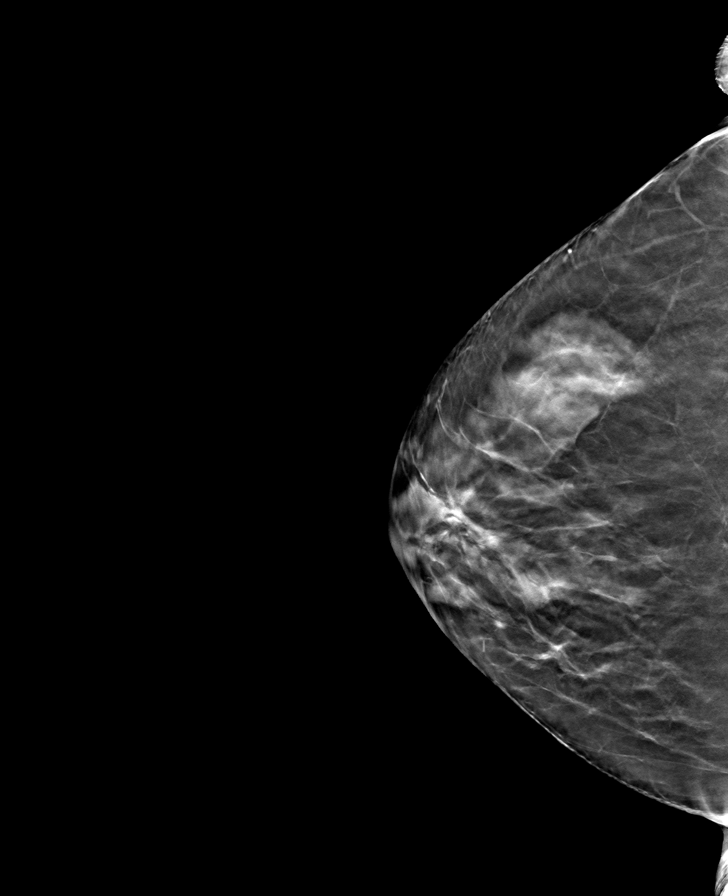

[L CC tomo · tomo slice 33/66.0]
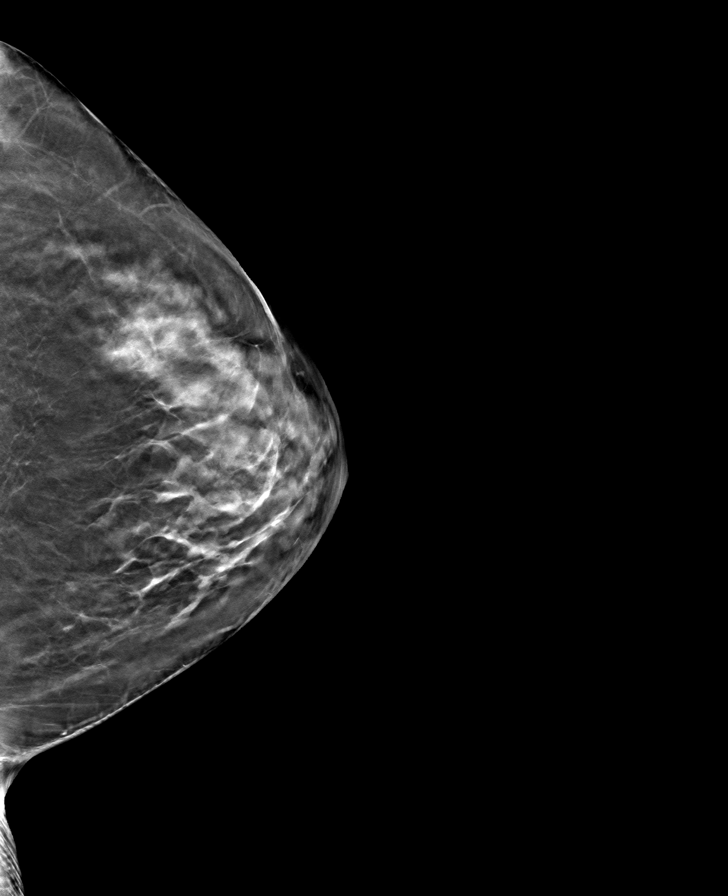

[L MLO tomo · tomo slice 33/66.0]
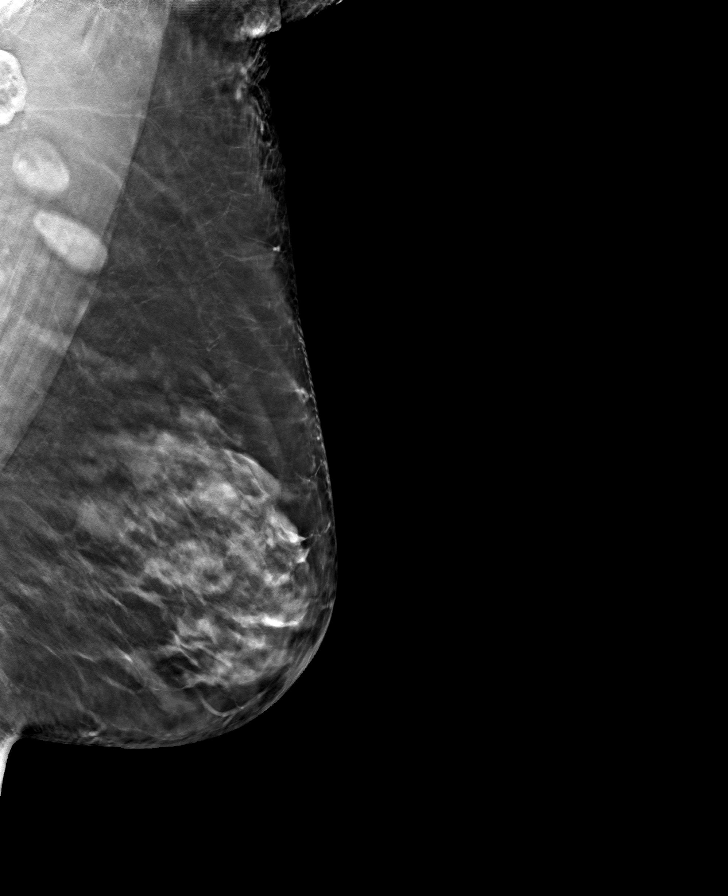

[8 of 24 positions shown; findings below may reference images not displayed]

ACR Breast Density Category c: The breast tissue is heterogeneously
dense, which may obscure small masses.
FINDINGS: There are no findings suspicious for malignancy. Images were
processed with CAD.
IMPRESSION: No mammographic evidence of malignancy. A result letter of this
screening mammogram will be mailed directly to the patient.

RECOMMENDATION:
Screening mammogram in one year. (Code:[id number])

BI-RADS CATEGORY  1: Negative.

## 2021-06-24 MED ORDER — SPIRONOLACTONE-HCTZ 25-25 MG PO TABS: 1.0000 | ORAL_TABLET | Freq: Every day | ORAL | 3 refills | Status: DC

## 2021-06-24 NOTE — Progress Notes (Signed)
?Cardiology Office Note:   ? ?Date:  06/24/2021  ? ?ID:  Alisha Cochran, DOB [birth year], MRN [id number] ? ?PCP:  Algis Greenhouse, MD  ?Cardiologist:  Jenean Lindau, MD  ? ?Referring MD: Algis Greenhouse, MD  ? ? ?ASSESSMENT:   ? ?1. Essential hypertension   ?2. Atherosclerotic vascular disease   ?3. Mixed dyslipidemia   ? ?PLAN:   ? ?In order of problems listed above: ? ?Atherosclerotic vascular disease: Secondary prevention stressed with the patient.  Importance of compliance with diet medication stressed and she vocalized understanding. ?Essential hypertension: Blood pressure stable and diet was emphasized.  Lifestyle modification urged. ?Mixed dyslipidemia: On statin therapy.  Lipids reviewed.  These are followed by primary care. ?Bilateral pedal edema: I suspect some of this is amlodipine related issue.  It affects her quality of life.  I have asked her to quit her medication.  She will be initiated on Spirozide and will come back in 1 week with her blood pressure logs and Chem-7 will be done on that day.  Lifestyle modification urged salt intake issues were discussed ?Patient will be seen in follow-up appointment in 6 months or earlier if the patient has any concerns ? ? ? ?Medication Adjustments/Labs and Tests Ordered: ?Current medicines are reviewed at length with the patient today.  Concerns regarding medicines are outlined above.  ?No orders of the defined types were placed in this encounter. ? ?No orders of the defined types were placed in this encounter. ? ? ? ?No chief complaint on file. ?  ? ?History of Present Illness:   ? ?Alisha Cochran is a 76 y.o. female.  Patient has past medical history of essential hypertension, dyslipidemia and multiple myeloma-like syndrome.  She denies any problems at this time except bilateral pedal edema.  She has used furosemide with relief to the symptoms.  At the time of my evaluation, the patient is alert awake oriented and in no distress. ? ?Past Medical  History:  ?Diagnosis Date  ? Acute pain of right knee 09/04/2019  ? Formatting of this note might be different from the original. 2021  ? Allergic rhinitis 09/15/2015  ? Anemia 11/16/2020  ? Anemia, iron deficiency 07/21/2015  ? Antineoplastic chemotherapy induced anemia 12/26/2020  ? Arthropathy 09/15/2015  ? Atherosclerotic vascular disease 03/16/2021  ? Benign hypertension 07/21/2015  ? Bruising 07/04/2020  ? Formatting of this note might be different from the original. 07/04/2020: arms  ? Chronic pain syndrome 05/22/2015  ? Degeneration of intervertebral disc of lumbar region 05/22/2015  ? Overview:  2014: MRI DDD L4/5  Formatting of this note might be different from the original. 2014: MRI DDD L4/5  ? Diverticulosis of colon 09/15/2015  ? 2014, 2020: diverticulitis  ? Dyspnea on exertion 10/25/2018  ? 2020  ? Essential hypertension 05/08/2019  ? Family history of premature coronary artery disease 04/12/2018  ? Fibrocystic breast disease 09/15/2015  ? Herpes zoster without complication 38/93/7342  ? 2020: left T12  ? History of TIA (transient ischemic attack) 07/23/2020  ? Insomnia 05/21/2015  ? Intermittent claudication ([place]) 07/14/2020  ? Lumbar radiculopathy 09/25/2012  ? Overview:  2008: onset 2009: MRI DDD with tear and protrusion L5-S1, facet arthropathy L5-S1 2014: MRI L4-5 DDD with progression of L5-S1 changes 2014: Pain eval, limited  Formatting of this note might be different from the original. 2008: onset 2009: MRI DDD with tear and protrusion L5-S1, facet arthropathy L5-S1 2014: MRI L4-5 DDD with progression of L5-S1 changes 2014:  Pain eval, limited  ? Mixed dyslipidemia 03/16/2021  ? 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 Non-Hodgkin lymphoma ([place]) 11/17/2020  ? Obesity (BMI 30-39.9) 03/21/2016  ? Other intervertebral disc degeneration, lumbar region 05/22/2015  ? Overview:  2014: MRI DDD L4/5  ? Overweight 05/08/2019  ? Peripheral arterial disease ([place]) 09/15/2015  ?  Overview:  2009: right CFA 50%, asx 2017: insurance nurse left 0.86 right 0.94  ? Pharyngeal lesion 10/08/2016  ? Overview:  2018: right  ? Prediabetes 10/20/2018  ? Radiculopathy of lumbar region 09/25/2012  ? Overview:  2008: onset 2009: MRI DDD with tear and protrusion L5-S1, facet arthropathy L5-S1 2014: MRI L4-5 DDD with progression of L5-S1 changes 2014: Pain eval, limited  ? Screening for osteoporosis 09/15/2015  ? 2008: -0.7 2016: -1.6 2019: -1.5  Formatting of this note might be different from the original. 2008: -0.7 2016: -1.6 2019: -1.5  Formatting of this note might be different from the original. 2008: -0.7 2016: -1.6 2019: -1.5 2021: -1.6  ? Small cell B-cell lymphoma ([place]) 12/01/2020  ? Formatting of this note might be different from the original. 2022: dx/rx  ? 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 Formatting of this note might be different from the original. 2021: doppler 1-39%, right 0%  ? Stroke [facility])   ? 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 Wellness examination 09/15/2015  ? ? ?Past Surgical History:  ?Procedure Laterality Date  ? BREAST EXCISIONAL BIOPSY Right   ? x2  ? BREAST EXCISIONAL BIOPSY Left   ? x2  ? CHOLECYSTECTOMY    ? OVARIAN CYST SURGERY    ? ROTATOR CUFF REPAIR Right 2008  ? ROTATOR CUFF REPAIR Left 2006  ? TONSILLECTOMY    ? VAGINAL HYSTERECTOMY  1987  ? ? ?Current Medications: ?Current Meds  ?Medication Sig  ? amLODipine (NORVASC) 10 MG tablet Take 1 tablet (10 mg total) by mouth daily.  ? cyanocobalamin (,VITAMIN B-12,) 1000 MCG/ML injection Inject 1,000 mg into the muscle every 30 (thirty) days.  ? furosemide (LASIX) 40 MG tablet Take 1 tablet (40 mg total) by mouth as needed for fluid or edema.  ? losartan (COZAAR) 100 MG tablet TAKE 1 TABLET(100 MG) BY MOUTH DAILY  ? metoprolol tartrate (LOPRESSOR) 50 MG tablet Take 50 mg by mouth every 12 (twelve) hours.  ? Omeprazole Magnesium (PRILOSEC PO) Take 1 tablet by mouth as needed (heartburn).  ? pregabalin (LYRICA) 75  MG capsule Take 75 mg by mouth at bedtime.  ? rosuvastatin (CRESTOR) 40 MG tablet Take 40 mg by mouth daily.  ? traMADol (ULTRAM) 50 MG tablet Take 50 mg by mouth as needed for moderate pain.  ? zolpidem (AMBIEN) 10 MG tablet TAKE 1/2 TABLET(5 MG) BY MOUTH EVERY NIGHT AS NEEDED FOR SLEEP  ?  ? ?Allergies:   Lisinopril  ? ?Social History  ? ?Socioeconomic History  ? Marital status: Married  ?  Spouse name: Not on file  ? Number of children: 2  ? Years of education: Not on file  ? Highest education level: Not on file  ?Occupational History  ? Occupation: retired Consulting civil engineer  ?Tobacco Use  ? Smoking status: Former  ?  Types: Cigarettes  ?  Start date: 1966  ?  Quit date: 03/01/1978  ?  Years since quitting: 43.3  ? Smokeless tobacco: Never  ?Substance and Sexual Activity  ? Alcohol use: Yes  ?  Alcohol/week: 14.0 standard drinks  ?  Types: 14 Glasses  of wine per week  ?  Comment: with diinner daily for 50 years  ? Drug use: Never  ? Sexual activity: Not on file  ?Other Topics Concern  ? Not on file  ?Social History Narrative  ? Not on file  ? ?Social Determinants of Health  ? ?Financial Resource Strain: Not on file  ?Food Insecurity: Not on file  ?Transportation Needs: Not on file  ?Physical Activity: Not on file  ?Stress: Not on file  ?Social Connections: Not on file  ?  ? ?Family History: ?The patient's family history includes Breast cancer in her maternal grandmother, mother, and paternal grandmother; Heart attack in her paternal grandfather and paternal grandmother; Heart disease in her father. ? ?ROS:   ?Please see the history of present illness.    ?All other systems reviewed and are negative. ? ?EKGs/Labs/Other Studies Reviewed:   ? ?The following studies were reviewed today: ?I discussed my findings with the patient at length ? ? ?Recent Labs: ?06/15/2021: ALT 26; BUN 12; Creatinine 0.7; Hemoglobin 9.8; Platelets 150; Potassium 3.7; Sodium 141  ?Recent Lipid Panel ?No results found for: CHOL, TRIG, HDL,  CHOLHDL, VLDL, LDLCALC, LDLDIRECT ? ?Physical Exam:   ? ?VS:  BP (!) 134/52   Pulse 64   Ht '5\' 5"'  (1.651 m)   Wt 165 lb 6.4 oz (75 kg)   SpO2 99%   BMI 27.52 kg/m?    ? ?Wt Readings from Last 3 Encounters:  ?04

## 2021-06-24 NOTE — Patient Instructions (Addendum)
Medication Instructions:  ?Your physician has recommended you make the following change in your medication:  ? ?Stop Amlodipine ? ?Start Spironolactone-Hydrochlorothiazide 25-25 mg take 1 daily. ? ? ?*If you need a refill on your cardiac medications before your next appointment, please call your pharmacy* ? ? ?Lab Work: ?Your physician recommends that you return for lab work in: 1 week for a BMET. ?You do not need to fast.  You can come Monday through Friday 8:30 am to 12:00 pm and 1:15 to 4:30. You do not need to make an appointment as the order has already been placed.  ? ?If you have labs (blood work) drawn today and your tests are completely normal, you will receive your results only by: ?MyChart Message (if you have MyChart) OR ?A paper copy in the mail ?If you have any lab test that is abnormal or we need to change your treatment, we will call you to review the results. ? ? ?Testing/Procedures: ?None ordered ? ? ?Follow-Up: ?At [facility], you and your health needs are our priority.  As part of our continuing mission to provide you with exceptional heart care, we have created designated Provider Care Teams.  These Care Teams include your primary Cardiologist (physician) and Advanced Practice Providers (APPs -  Physician Assistants and Nurse Practitioners) who all work together to provide you with the care you need, when you need it. ? ?We recommend signing up for the patient portal called "MyChart".  Sign up information is provided on this After Visit Summary.  MyChart is used to connect with patients for Virtual Visits (Telemedicine).  Patients are able to view lab/test results, encounter notes, upcoming appointments, etc.  Non-urgent messages can be sent to your provider as well.   ?To learn more about what you can do with MyChart, go to [email or web address].   ? ?Your next appointment:   ?6 month(s) ? ?The format for your next appointment:   ?In Person ? ?Provider:   ?Jyl Heinz, MD ? ? ?Other  Instructions ?NA ? ?

## 2021-07-07 NOTE — Addendum Note (Signed)
Addended by: Truddie Hidden on: 07/07/2021 09:45 AM ? ? Modules accepted: Orders ? ?

## 2021-07-08 LAB — CBC WITH DIFFERENTIAL/PLATELET
Basophils Absolute: 0 10*3/uL (ref 0.0–0.2)
Basos: 1 %
EOS (ABSOLUTE): 0 10*3/uL (ref 0.0–0.4)
Eos: 1 %
Hematocrit: 30.4 % — ABNORMAL LOW (ref 34.0–46.6)
Hemoglobin: 10.4 g/dL — ABNORMAL LOW (ref 11.1–15.9)
Immature Grans (Abs): 0 10*3/uL (ref 0.0–0.1)
Immature Granulocytes: 0 %
Lymphocytes Absolute: 0.8 10*3/uL (ref 0.7–3.1)
Lymphs: 32 %
MCH: 30 pg (ref 26.6–33.0)
MCHC: 34.2 g/dL (ref 31.5–35.7)
MCV: 88 fL (ref 79–97)
Monocytes Absolute: 0.4 10*3/uL (ref 0.1–0.9)
Monocytes: 14 %
Neutrophils Absolute: 1.3 10*3/uL — ABNORMAL LOW (ref 1.4–7.0)
Neutrophils: 52 %
Platelets: 136 10*3/uL — ABNORMAL LOW (ref 150–450)
RBC: 3.47 x10E6/uL — ABNORMAL LOW (ref 3.77–5.28)
RDW: 16 % — ABNORMAL HIGH (ref 11.7–15.4)
WBC: 2.6 10*3/uL — ABNORMAL LOW (ref 3.4–10.8)

## 2021-07-08 LAB — BASIC METABOLIC PANEL
BUN/Creatinine Ratio: 34 — ABNORMAL HIGH (ref 12–28)
BUN: 37 mg/dL — ABNORMAL HIGH (ref 8–27)
CO2: 21 mmol/L (ref 20–29)
Calcium: 9.7 mg/dL (ref 8.7–10.3)
Chloride: 104 mmol/L (ref 96–106)
Creatinine, Ser: 1.09 mg/dL — ABNORMAL HIGH (ref 0.57–1.00)
Glucose: 113 mg/dL — ABNORMAL HIGH (ref 70–99)
Potassium: 4.5 mmol/L (ref 3.5–5.2)
Sodium: 140 mmol/L (ref 134–144)
eGFR: 53 mL/min/{1.73_m2} — ABNORMAL LOW (ref >59)

## 2021-07-13 DIAGNOSIS — R5382 Chronic fatigue, unspecified: Secondary | ICD-10-CM | POA: Insufficient documentation

## 2021-07-13 DIAGNOSIS — J40 Bronchitis, not specified as acute or chronic: Secondary | ICD-10-CM | POA: Insufficient documentation

## 2021-07-13 HISTORY — DX: Bronchitis, not specified as acute or chronic: J40

## 2021-07-13 HISTORY — DX: Chronic fatigue, unspecified: R53.82

## 2021-07-14 ENCOUNTER — Telehealth: Payer: Self-pay

## 2021-07-14 NOTE — Telephone Encounter (Addendum)
Appt made for Thursday afternoon. ? ?RE: Increased fatigue, increased SOB on exertion ?Received: Today ?[name], NP  Dairl Ponder, RN ?I can see her Thursday or Friday this week.  ? ?Pt is feeling more tired. She is SOB on exertion, just walking from the kitchen to another room. She does have intermittent dizziness/lightheadness. No fevers. No night sweats. She has had about 13-14# weight loss, some of which is intentional. She saw her PCP, Dr Garlon Hatchet, yesterday and he told her she needed to call Dr Bobby Rumpf. Dr Garlon Hatchet couldn't find a reason why she would be feeling like this from his stand point and thought Dr Bobby Rumpf should evaluate her. Pt states, "When I saw Dr Bobby Rumpf in April, I was feeling fine. I don't know what has happened". I sent the above message to Melissa,NP, @ [phone]-[facility]c. ?

## 2021-07-16 ENCOUNTER — Inpatient Hospital Stay: Payer: Medicare PPO | Attending: Oncology | Admitting: Hematology and Oncology

## 2021-07-16 ENCOUNTER — Other Ambulatory Visit: Payer: Self-pay

## 2021-07-16 ENCOUNTER — Inpatient Hospital Stay: Payer: Medicare PPO

## 2021-07-16 VITALS — BP 128/59 | HR 61 | Temp 98.0°F | Resp 16 | Ht 65.0 in | Wt 151.8 lb

## 2021-07-16 DIAGNOSIS — C8200 Follicular lymphoma grade I, unspecified site: Secondary | ICD-10-CM

## 2021-07-16 DIAGNOSIS — C8309 Small cell B-cell lymphoma, extranodal and solid organ sites: Secondary | ICD-10-CM

## 2021-07-16 DIAGNOSIS — R0602 Shortness of breath: Secondary | ICD-10-CM | POA: Diagnosis not present

## 2021-07-16 DIAGNOSIS — C8590 Non-Hodgkin lymphoma, unspecified, unspecified site: Secondary | ICD-10-CM | POA: Diagnosis not present

## 2021-07-16 DIAGNOSIS — Z79899 Other long term (current) drug therapy: Secondary | ICD-10-CM | POA: Diagnosis not present

## 2021-07-16 DIAGNOSIS — N189 Chronic kidney disease, unspecified: Secondary | ICD-10-CM

## 2021-07-16 MED ORDER — AZITHROMYCIN 250 MG PO TABS: ORAL_TABLET | ORAL | 0 refills | Status: DC

## 2021-07-16 NOTE — Progress Notes (Signed)
Patient recently had surgery to right forehead - basal cell

## 2021-07-17 ENCOUNTER — Encounter: Payer: Self-pay | Admitting: Hematology and Oncology

## 2021-07-17 ENCOUNTER — Other Ambulatory Visit: Payer: Self-pay | Admitting: Hematology and Oncology

## 2021-07-17 LAB — KAPPA/LAMBDA LIGHT CHAINS
Kappa free light chain: 224 mg/L — ABNORMAL HIGH (ref 3.3–19.4)
Kappa, lambda light chain ratio: 26.35 — ABNORMAL HIGH (ref 0.26–1.65)
Lambda free light chains: 8.5 mg/L (ref 5.7–26.3)

## 2021-07-17 LAB — CBC AND DIFFERENTIAL
HCT: 29 — AB (ref 36–46)
Hemoglobin: 9.7 — AB (ref 12.0–16.0)
Neutrophils Absolute: 2.28
Platelets: 157 10*3/uL (ref 150–400)
WBC: 3.8

## 2021-07-17 LAB — COMPREHENSIVE METABOLIC PANEL
Albumin: 4.7 (ref 3.5–5.0)
Calcium: 9.7 (ref 8.7–10.7)

## 2021-07-17 LAB — BASIC METABOLIC PANEL
BUN: 36 — AB (ref 4–21)
CO2: 22 (ref 13–22)
Chloride: 105 (ref 99–108)
Creatinine: 1.2 — AB (ref 0.5–1.1)
Glucose: 109
Potassium: 4.4 mEq/L (ref 3.5–5.1)
Sodium: 141 (ref 137–147)

## 2021-07-17 LAB — HEPATIC FUNCTION PANEL
ALT: 17 U/L (ref 7–35)
AST: 27 (ref 13–35)
Alkaline Phosphatase: 60 (ref 25–125)
Bilirubin, Total: 1.2

## 2021-07-17 LAB — CBC: RBC: 3.36 — AB (ref 3.87–5.11)

## 2021-07-17 NOTE — Assessment & Plan Note (Addendum)
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 CT scan images revealed she no longer has any lymphadenopathy which is concerning for radiographically persistent lymphoma.  Of note, the reason her lymphoma came to the forefront was due to abnormal monoclonal parameters which initially suggested the presence of multiple myeloma.  However, her bone marrow biopsy proved otherwise.  Although her monoclonal parameters have not completely dissipated, there has at least been a 90% reduction in her IgG, M spike, and kappa light chain levels, which is a reflection of the positive disease response she had from her 6 cycles of Bendamustine/Rituxan.  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 She presents to clinic today with new onset shortness of breath and fatigue. CTA imaging today was negative for PE. Hemoglobin is 9.7 today. She does have upper respiratory symptoms, so I will start her on zithromax. Myeloma panel is pending from today. She will return to clinic in one week for repeat evaluation.

## 2021-07-17 NOTE — Progress Notes (Cosign Needed)
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 CT scan images revealed she no longer has any lymphadenopathy which is concerning for radiographically persistent lymphoma.  Of note, the reason her lymphoma came to the forefront was due to abnormal monoclonal parameters which initially suggested the presence of multiple myeloma.  However, her bone marrow biopsy proved otherwise.  Although her monoclonal parameters have not completely dissipated, there has at least been a 90% reduction in her IgG, M spike, and kappa light chain levels, which is a reflection of the positive disease response she had from her 6 cycles of Bendamustine/Rituxan.  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 She presents to clinic today with new onset shortness of breath and fatigue. CTA imaging today was negative for PE. Hemoglobin is 9.7 today. She does have upper respiratory symptoms, so I will start her on zithromax. Myeloma panel is pending from today. She will return to clinic in one week for repeat evaluation.    The patient understands the plans discussed today and is in agreement with them.  She knows to contact our office if she develops concerns prior to her next appointment.    [name], NP  [place] [address] [place] [address] Dept: [phone] Dept Fax: [phone]   Orders Placed This Encounter  Procedures   CT Angio Chest Pulmonary Embolism (PE) W or WO Contrast    Standing Status:    Future    Standing Expiration Date:   07/17/2022    Order Specific Question:   If indicated for the ordered procedure, I authorize the administration of contrast media per Radiology protocol    Answer:   Yes    Order Specific Question:   Preferred imaging location?    Answer:   External   CBC w Diff ([facility] scanned report) STAT    Standing Status:   Future    Number of Occurrences:   1    Standing Expiration Date:   07/17/2022   CMP ([facility] scanned report) STAT    Standing Status:   Future    Number of Occurrences:   1    Standing Expiration Date:   07/17/2022   Multiple Myeloma Panel (SPEP&IFE w/QIG)    Standing Status:   Future    Number of Occurrences:   1    Standing Expiration Date:   07/17/2022   Kappa/lambda light chains    Standing Status:   Future    Number of Occurrences:   1    Standing Expiration Date:   07/16/2022   CBC and differential    This external order was created through the Results Console.   CBC    This external order was created through the Results Console.   Basic metabolic panel    This external order was created through the Results Console.   Comprehensive metabolic panel    This external order was created through the Results Console.   Hepatic function panel    This external order was created through the Results Console.  CHIEF COMPLAINT:  CC: A 76 year old female with history of myeloma here for symptom management; shortness of breath.   Current Treatment:  Surveillance  INTERVAL HISTORY:  Haidy is here today for repeat clinical assessment. She denies fevers or chills. She denies pain. Her appetite is good. Her weight has been stable.  I have reviewed the past medical history, past surgical history, social history and family history with the patient and they are unchanged from previous note.  ALLERGIES:  is allergic to lisinopril.  MEDICATIONS:  Current Outpatient Medications  Medication Sig Dispense Refill   azithromycin  (ZITHROMAX Z-PAK) 250 MG tablet Take as directed on package 6 each 0   cyanocobalamin (,VITAMIN B-12,) 1000 MCG/ML injection Inject 1,000 mg into the muscle every 30 (thirty) days.     furosemide (LASIX) 40 MG tablet Take 1 tablet (40 mg total) by mouth as needed for fluid or edema. 90 tablet 2   losartan (COZAAR) 100 MG tablet TAKE 1 TABLET(100 MG) BY MOUTH DAILY 90 tablet 2   metoprolol tartrate (LOPRESSOR) 50 MG tablet Take 50 mg by mouth every 12 (twelve) hours.     Omeprazole Magnesium (PRILOSEC PO) Take 1 tablet by mouth as needed (heartburn).     pregabalin (LYRICA) 75 MG capsule Take 75 mg by mouth at bedtime.     rosuvastatin (CRESTOR) 40 MG tablet Take 40 mg by mouth daily.     spironolactone-hydrochlorothiazide (ALDACTAZIDE) 25-25 MG tablet Take 1 tablet by mouth daily. 30 tablet 3   traMADol (ULTRAM) 50 MG tablet Take 50 mg by mouth as needed for moderate pain.     zolpidem (AMBIEN) 10 MG tablet TAKE 1/2 TABLET(5 MG) BY MOUTH EVERY NIGHT AS NEEDED FOR SLEEP     No current facility-administered medications for this visit.    HISTORY OF PRESENT ILLNESS:   Oncology History  Non-Hodgkin lymphoma ([place])  11/17/2020 Initial Diagnosis   Non-Hodgkin lymphoma ([place])    12/02/2020 - 04/24/2021 Chemotherapy   Patient is on Treatment Plan : NON-HODGKINS LYMPHOMA Rituximab D1 / Bendamustine D1,2 q28d          REVIEW OF SYSTEMS:   Constitutional: Denies fevers, chills or abnormal weight loss Eyes: Denies blurriness of vision Ears, nose, mouth, throat, and face: Denies mucositis or sore throat Respiratory: Denies cough, dyspnea or wheezes Cardiovascular: Denies palpitation, chest discomfort or lower extremity swelling Gastrointestinal:  Denies nausea, heartburn or change in bowel habits Skin: Denies abnormal skin rashes Lymphatics: Denies new lymphadenopathy or easy bruising Neurological:Denies numbness, tingling or new weaknesses Behavioral/Psych: Mood is stable, no new changes   All other systems were reviewed with the patient and are negative.   VITALS:  Blood pressure (!) 128/59, pulse 61, temperature 98 F (36.7 C), resp. rate 16, height 5' 5" (1.651 m), weight 151 lb 12.8 oz (68.9 kg), SpO2 100 %.  Wt Readings from Last 3 Encounters:  07/16/21 151 lb 12.8 oz (68.9 kg)  06/24/21 165 lb 6.4 oz (75 kg)  06/19/21 162 lb 12.8 oz (73.8 kg)    Body mass index is 25.26 kg/m.  Performance status (ECOG): 1 - Symptomatic but completely ambulatory  PHYSICAL EXAM:   GENERAL:alert, no distress and comfortable SKIN: skin color, texture, turgor are normal, no rashes or significant lesions EYES: normal, Conjunctiva are pink and non-injected, sclera clear OROPHARYNX:no exudate, no erythema and lips, buccal mucosa, and tongue normal  NECK: supple, thyroid normal size, non-tender, without nodularity LYMPH:  no palpable lymphadenopathy in the cervical, axillary or inguinal  LUNGS: clear to auscultation and percussion with normal breathing effort HEART: regular rate & rhythm and no murmurs and no lower extremity edema ABDOMEN:abdomen soft, non-tender and normal bowel sounds Musculoskeletal:no cyanosis of digits and no clubbing  NEURO: alert & oriented x 3 with fluent speech, no focal motor/sensory deficits  LABORATORY DATA:  I have reviewed the data as listed    Component Value Date/Time   NA 141 07/17/2021 0000   K 4.4 07/17/2021 0000   CL 105 07/17/2021 0000   CO2 22 07/17/2021 0000   GLUCOSE 113 (H) 07/07/2021 0938   BUN 36 (A) 07/17/2021 0000   CREATININE 1.2 (A) 07/17/2021 0000   CREATININE 1.09 (H) 07/07/2021 0938   CALCIUM 9.7 07/17/2021 0000   ALBUMIN 4.7 07/17/2021 0000   AST 27 07/17/2021 0000   ALT 17 07/17/2021 0000   ALKPHOS 60 07/17/2021 0000   GFRNONAA 76 01/30/2020 0957   GFRAA 88 01/30/2020 0957    No results found for: SPEP, UPEP  Lab Results  Component Value Date   WBC 3.8 07/17/2021   NEUTROABS 2.28 07/17/2021   HGB 9.7 (A)  07/17/2021   HCT 29 (A) 07/17/2021   MCV 88 07/07/2021   PLT 157 07/17/2021      Chemistry      Component Value Date/Time   NA 141 07/17/2021 0000   K 4.4 07/17/2021 0000   CL 105 07/17/2021 0000   CO2 22 07/17/2021 0000   BUN 36 (A) 07/17/2021 0000   CREATININE 1.2 (A) 07/17/2021 0000   CREATININE 1.09 (H) 07/07/2021 0938   GLU 109 07/17/2021 0000      Component Value Date/Time   CALCIUM 9.7 07/17/2021 0000   ALKPHOS 60 07/17/2021 0000   AST 27 07/17/2021 0000   ALT 17 07/17/2021 0000       RADIOGRAPHIC STUDIES: I have personally reviewed the radiological images as listed and agreed with the findings in the report. No results found.

## 2021-07-21 LAB — MULTIPLE MYELOMA PANEL, SERUM
Albumin SerPl Elph-Mcnc: 3.8 g/dL (ref 2.9–4.4)
Albumin/Glob SerPl: 1.6 (ref 0.7–1.7)
Alpha 1: 0.3 g/dL (ref 0.0–0.4)
Alpha2 Glob SerPl Elph-Mcnc: 0.6 g/dL (ref 0.4–1.0)
B-Globulin SerPl Elph-Mcnc: 0.8 g/dL (ref 0.7–1.3)
Gamma Glob SerPl Elph-Mcnc: 0.7 g/dL (ref 0.4–1.8)
Globulin, Total: 2.4 g/dL (ref 2.2–3.9)
IgA: 6 mg/dL — ABNORMAL LOW (ref 64–422)
IgG (Immunoglobin G), Serum: 743 mg/dL (ref 586–1602)
IgM (Immunoglobulin M), Srm: 5 mg/dL — ABNORMAL LOW (ref 26–217)
M Protein SerPl Elph-Mcnc: 0.5 g/dL — ABNORMAL HIGH
Total Protein ELP: 6.2 g/dL (ref 6.0–8.5)

## 2021-07-23 ENCOUNTER — Other Ambulatory Visit: Payer: Self-pay | Admitting: Hematology and Oncology

## 2021-07-23 ENCOUNTER — Encounter: Payer: Self-pay | Admitting: Hematology and Oncology

## 2021-07-23 ENCOUNTER — Inpatient Hospital Stay: Payer: Medicare PPO

## 2021-07-23 ENCOUNTER — Encounter: Payer: Self-pay | Admitting: Oncology

## 2021-07-23 ENCOUNTER — Inpatient Hospital Stay: Payer: Medicare PPO | Admitting: Hematology and Oncology

## 2021-07-23 VITALS — BP 135/63 | HR 82 | Temp 97.7°F | Resp 18 | Ht 65.0 in | Wt 149.6 lb

## 2021-07-23 DIAGNOSIS — C8200 Follicular lymphoma grade I, unspecified site: Secondary | ICD-10-CM

## 2021-07-23 DIAGNOSIS — N189 Chronic kidney disease, unspecified: Secondary | ICD-10-CM

## 2021-07-23 DIAGNOSIS — C8590 Non-Hodgkin lymphoma, unspecified, unspecified site: Secondary | ICD-10-CM | POA: Diagnosis not present

## 2021-07-23 HISTORY — DX: Chronic kidney disease, unspecified: N18.9

## 2021-07-23 LAB — VITAMIN B12: Vitamin B-12: 917 pg/mL — ABNORMAL HIGH (ref 180–914)

## 2021-07-23 LAB — CBC AND DIFFERENTIAL
HCT: 29 — AB (ref 36–46)
Hemoglobin: 9.7 — AB (ref 12.0–16.0)
Neutrophils Absolute: 3.35
Platelets: 138 10*3/uL — AB (ref 150–400)
WBC: 5.4

## 2021-07-23 LAB — FERRITIN: Ferritin: 381 ng/mL — ABNORMAL HIGH (ref 11–307)

## 2021-07-23 LAB — IRON AND TIBC
Iron: 33 ug/dL (ref 28–170)
Saturation Ratios: 11 % (ref 10.4–31.8)
TIBC: 295 ug/dL (ref 250–450)
UIBC: 262 ug/dL

## 2021-07-23 LAB — FOLATE: Folate: 21.4 ng/mL (ref >5.9)

## 2021-07-23 LAB — CBC: RBC: 3.3 — AB (ref 3.87–5.11)

## 2021-07-23 NOTE — Progress Notes (Cosign Needed)
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 CT scan images revealed she no longer has any lymphadenopathy which is concerning for radiographically persistent lymphoma.  Of note, the reason her lymphoma came to the forefront was due to abnormal monoclonal parameters which initially suggested the presence of multiple myeloma.  However, her bone marrow biopsy proved otherwise.  Although her monoclonal parameters have not completely dissipated, there has at least been a 90% reduction in her IgG, M spike, and kappa light chain levels, which is a reflection of the positive disease response she had from her 6 cycles of Bendamustine/Rituxan.  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 She had increasing shortness of breath last week and CTA was negative for PE. She was treated by PCP for URI. She presents to clinic today with even worse shortness of breath. Hemoglobin remains 9.7 today. Oxygen sats were 100% today with ambulation. Dr. Bobby Rumpf was in to examine as well. We will obtain iron studies as well as vitamin B12, folate and thyroid panel today. If iron studies, folate and B12 are normal, we will proceed with Retacrit injections tomorrow, if possible.    The patient understands the plans discussed today and is in agreement with them.  She knows to contact our office if she develops concerns prior to her next appointment.     [name], NP  [place] [address] [place] [address] Dept:  [phone] Dept Fax: [phone]   Orders Placed This Encounter  Procedures   DG Chest 2 View    Standing Status:   Future    Standing Expiration Date:   07/23/2022    Order Specific Question:   Reason for exam:    Answer:   shortness of breath    Order Specific Question:   Preferred imaging location?    Answer:   External   Iron and TIBC    Standing Status:   Future    Number of Occurrences:   1    Standing Expiration Date:   07/24/2022   Ferritin    Standing Status:   Future    Number of Occurrences:   1    Standing Expiration Date:   07/23/2022   Vitamin B12    Standing Status:   Future    Number of Occurrences:   1    Standing Expiration Date:   07/23/2022   Folate    Standing Status:   Future    Number of Occurrences:   1    Standing Expiration Date:   07/24/2022   Iron and TIBC    Standing Status:   Future    Number of Occurrences:   1    Standing Expiration Date:   07/24/2022   Thyroid Panel With TSH    Standing Status:   Future    Number of Occurrences:   1    Standing Expiration Date:   07/23/2022   CBC and differential    This external order was created through the Results Console.   CBC  This external order was created through the Results Console.   ECHOCARDIOGRAM COMPLETE    Standing Status:   Future    Standing Expiration Date:   07/24/2022    Order Specific Question:   Where should this test be performed    Answer:   [facility]    Order Specific Question:   Perflutren DEFINITY (image enhancing agent) should be administered unless hypersensitivity or allergy exist    Answer:   Administer Perflutren    Order Specific Question:   Reason for exam-Echo    Answer:   Dyspnea  R06.00      CHIEF COMPLAINT:  CC: A 77 year old female with history of lymphoma here for symptom management; shortness of breath  Current Treatment:  Surveillance  INTERVAL HISTORY:  Alisha Cochran is here today for repeat clinical assessment. She denies fevers or chills. She denies pain.  Her appetite is good. Her weight has decreased 2 pounds over last 2 weeks .  I have reviewed the past medical history, past surgical history, social history and family history with the patient and they are unchanged from previous note.  ALLERGIES:  is allergic to lisinopril.  MEDICATIONS:  Current Outpatient Medications  Medication Sig Dispense Refill   benzonatate (TESSALON) 200 MG capsule Take by mouth.     cyanocobalamin (,VITAMIN B-12,) 1000 MCG/ML injection Inject 1,000 mg into the muscle every 30 (thirty) days.     furosemide (LASIX) 40 MG tablet Take 1 tablet (40 mg total) by mouth as needed for fluid or edema. 90 tablet 2   losartan (COZAAR) 100 MG tablet TAKE 1 TABLET(100 MG) BY MOUTH DAILY 90 tablet 2   metoprolol tartrate (LOPRESSOR) 50 MG tablet Take 50 mg by mouth every 12 (twelve) hours.     omeprazole (PRILOSEC OTC) 20 MG tablet Take by mouth.     potassium chloride SA (KLOR-CON M) 20 MEQ tablet Take by mouth.     pregabalin (LYRICA) 75 MG capsule Take 75 mg by mouth at bedtime.     rosuvastatin (CRESTOR) 40 MG tablet Take 40 mg by mouth daily.     spironolactone-hydrochlorothiazide (ALDACTAZIDE) 25-25 MG tablet Take 1 tablet by mouth daily. 30 tablet 3   traMADol (ULTRAM) 50 MG tablet Take 50 mg by mouth as needed for moderate pain.     zolpidem (AMBIEN) 10 MG tablet TAKE 1/2 TABLET(5 MG) BY MOUTH EVERY NIGHT AS NEEDED FOR SLEEP     No current facility-administered medications for this visit.    HISTORY OF PRESENT ILLNESS:   Oncology History  Non-Hodgkin lymphoma ([place])  11/17/2020 Initial Diagnosis   Non-Hodgkin lymphoma ([place])    12/02/2020 - 04/24/2021 Chemotherapy   Patient is on Treatment Plan : NON-HODGKINS LYMPHOMA Rituximab D1 / Bendamustine D1,2 q28d          REVIEW OF SYSTEMS:   Constitutional: Denies fevers, chills or abnormal weight loss Eyes: Denies blurriness of vision Ears, nose, mouth, throat, and face: Denies mucositis or sore  throat Respiratory: Denies cough, dyspnea or wheezes Cardiovascular: Denies palpitation, chest discomfort or lower extremity swelling Gastrointestinal:  Denies nausea, heartburn or change in bowel habits Skin: Denies abnormal skin rashes Lymphatics: Denies new lymphadenopathy or easy bruising Neurological:Denies numbness, tingling or new weaknesses Behavioral/Psych: Mood is stable, no new changes  All other systems were reviewed with the patient and are negative.   VITALS:  Blood pressure 135/63, pulse 82, temperature 97.7 F (36.5 C), temperature source Oral, resp. rate 18, height 5' 5" (1.651 m), weight 149  lb 9.6 oz (67.9 kg), SpO2 100 %.  Wt Readings from Last 3 Encounters:  07/23/21 149 lb 9.6 oz (67.9 kg)  07/16/21 151 lb 12.8 oz (68.9 kg)  06/24/21 165 lb 6.4 oz (75 kg)    Body mass index is 24.89 kg/m.  Performance status (ECOG): 1 - Symptomatic but completely ambulatory  PHYSICAL EXAM:   GENERAL:alert, no distress and comfortable SKIN: skin color, texture, turgor are normal, no rashes or significant lesions EYES: normal, Conjunctiva are pink and non-injected, sclera clear OROPHARYNX:no exudate, no erythema and lips, buccal mucosa, and tongue normal  NECK: supple, thyroid normal size, non-tender, without nodularity LYMPH:  no palpable lymphadenopathy in the cervical, axillary or inguinal LUNGS: clear to auscultation and percussion with normal breathing effort HEART: regular rate & rhythm and no murmurs and no lower extremity edema ABDOMEN:abdomen soft, non-tender and normal bowel sounds Musculoskeletal:no cyanosis of digits and no clubbing  NEURO: alert & oriented x 3 with fluent speech, no focal motor/sensory deficits  LABORATORY DATA:  I have reviewed the data as listed    Component Value Date/Time   NA 141 07/17/2021 0000   K 4.4 07/17/2021 0000   CL 105 07/17/2021 0000   CO2 22 07/17/2021 0000   GLUCOSE 113 (H) 07/07/2021 0938   BUN 36 (A) 07/17/2021 0000    CREATININE 1.2 (A) 07/17/2021 0000   CREATININE 1.09 (H) 07/07/2021 0938   CALCIUM 9.7 07/17/2021 0000   ALBUMIN 4.7 07/17/2021 0000   AST 27 07/17/2021 0000   ALT 17 07/17/2021 0000   ALKPHOS 60 07/17/2021 0000   GFRNONAA 76 01/30/2020 0957   GFRAA 88 01/30/2020 0957    No results found for: SPEP, UPEP  Lab Results  Component Value Date   WBC 5.4 07/23/2021   NEUTROABS 3.35 07/23/2021   HGB 9.7 (A) 07/23/2021   HCT 29 (A) 07/23/2021   MCV 88 07/07/2021   PLT 138 (A) 07/23/2021      Chemistry      Component Value Date/Time   NA 141 07/17/2021 0000   K 4.4 07/17/2021 0000   CL 105 07/17/2021 0000   CO2 22 07/17/2021 0000   BUN 36 (A) 07/17/2021 0000   CREATININE 1.2 (A) 07/17/2021 0000   CREATININE 1.09 (H) 07/07/2021 0938   GLU 109 07/17/2021 0000      Component Value Date/Time   CALCIUM 9.7 07/17/2021 0000   ALKPHOS 60 07/17/2021 0000   AST 27 07/17/2021 0000   ALT 17 07/17/2021 0000       RADIOGRAPHIC STUDIES: I have personally reviewed the radiological images as listed and agreed with the findings in the report. No results found.

## 2021-07-23 NOTE — Assessment & Plan Note (Signed)
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 CT scan images revealed she no longer has any lymphadenopathy which is concerning for radiographically persistent lymphoma.  Of note, the reason her lymphoma came to the forefront was due to abnormal monoclonal parameters which initially suggested the presence of multiple myeloma.  However, her bone marrow biopsy proved otherwise.  Although her monoclonal parameters have not completely dissipated, there has at least been a 90% reduction in her IgG, M spike, and kappa light chain levels, which is a reflection of the positive disease response she had from her 6 cycles of Bendamustine/Rituxan.  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 She had increasing shortness of breath last week and CTA was negative for PE. She was treated by PCP for URI. She presents to clinic today with even worse shortness of breath. Hemoglobin remains 9.7 today. Oxygen sats were 100% today with ambulation. Dr. Bobby Rumpf was in to examine as well. We will obtain iron studies as well as vitamin B12, folate and thyroid panel today. If iron studies, folate and B12 are normal, we will proceed with Retacrit injections tomorrow, if possible.

## 2021-07-24 ENCOUNTER — Ambulatory Visit (INDEPENDENT_AMBULATORY_CARE_PROVIDER_SITE_OTHER): Payer: Medicare PPO

## 2021-07-24 ENCOUNTER — Other Ambulatory Visit: Payer: Self-pay

## 2021-07-24 DIAGNOSIS — I517 Cardiomegaly: Secondary | ICD-10-CM | POA: Diagnosis not present

## 2021-07-24 DIAGNOSIS — N189 Chronic kidney disease, unspecified: Secondary | ICD-10-CM

## 2021-07-24 DIAGNOSIS — I5030 Unspecified diastolic (congestive) heart failure: Secondary | ICD-10-CM | POA: Diagnosis not present

## 2021-07-24 DIAGNOSIS — I083 Combined rheumatic disorders of mitral, aortic and tricuspid valves: Secondary | ICD-10-CM | POA: Diagnosis not present

## 2021-07-24 DIAGNOSIS — R0602 Shortness of breath: Secondary | ICD-10-CM | POA: Diagnosis not present

## 2021-07-24 DIAGNOSIS — C8200 Follicular lymphoma grade I, unspecified site: Secondary | ICD-10-CM

## 2021-07-24 LAB — ECHOCARDIOGRAM COMPLETE
Area-P 1/2: 2.13 cm2
S' Lateral: 2.5 cm

## 2021-07-25 LAB — THYROID PANEL WITH TSH
Free Thyroxine Index: 2.4 (ref 1.2–4.9)
T3 Uptake Ratio: 27 % (ref 24–39)
T4, Total: 9 ug/dL (ref 4.5–12.0)
TSH: 1.3 u[IU]/mL (ref 0.450–4.500)

## 2021-08-03 ENCOUNTER — Inpatient Hospital Stay: Payer: Medicare PPO | Attending: Oncology

## 2021-08-03 ENCOUNTER — Encounter: Payer: Self-pay | Admitting: Oncology

## 2021-08-03 VITALS — BP 116/50 | HR 57 | Temp 98.1°F | Resp 18 | Ht 65.0 in | Wt 150.8 lb

## 2021-08-03 DIAGNOSIS — D631 Anemia in chronic kidney disease: Secondary | ICD-10-CM | POA: Insufficient documentation

## 2021-08-03 DIAGNOSIS — D508 Other iron deficiency anemias: Secondary | ICD-10-CM

## 2021-08-03 DIAGNOSIS — N189 Chronic kidney disease, unspecified: Secondary | ICD-10-CM | POA: Insufficient documentation

## 2021-08-03 MED ORDER — EPOETIN ALFA-EPBX 40000 UNIT/ML IJ SOLN
40000.0000 [IU] | Freq: Once | INTRAMUSCULAR | Status: AC
  Administered 2021-08-03: 40000 [IU] via SUBCUTANEOUS

## 2021-08-03 MED ORDER — EPOETIN ALFA 40000 UNIT/ML IJ SOLN: 40000.0000 [IU] | Freq: Once | INTRAMUSCULAR | Status: DC

## 2021-08-28 ENCOUNTER — Other Ambulatory Visit: Payer: Self-pay | Admitting: Cardiology

## 2021-08-28 ENCOUNTER — Telehealth: Payer: Self-pay | Admitting: Cardiology

## 2021-08-28 NOTE — Telephone Encounter (Signed)
Sent earlier in the day

## 2021-08-28 NOTE — Telephone Encounter (Signed)
*  STAT* If patient is at the pharmacy, call can be transferred to refill team.   1. Which medications need to be refilled? (please list name of each medication and dose if known) losartan (COZAAR) 100 MG tablet  2. Which pharmacy/location (including street and city if local pharmacy) is medication to be sent to?[id number] - [place], [place] - [address] AT [address]  3. Do they need a 30 day or 90 day supply? [place]

## 2021-08-31 ENCOUNTER — Inpatient Hospital Stay: Payer: Medicare PPO | Attending: Oncology

## 2021-08-31 ENCOUNTER — Inpatient Hospital Stay: Payer: Medicare PPO

## 2021-08-31 ENCOUNTER — Other Ambulatory Visit: Payer: Self-pay | Admitting: Pharmacist

## 2021-08-31 DIAGNOSIS — C8309 Small cell B-cell lymphoma, extranodal and solid organ sites: Secondary | ICD-10-CM

## 2021-08-31 DIAGNOSIS — C8590 Non-Hodgkin lymphoma, unspecified, unspecified site: Secondary | ICD-10-CM | POA: Insufficient documentation

## 2021-08-31 DIAGNOSIS — N189 Chronic kidney disease, unspecified: Secondary | ICD-10-CM

## 2021-08-31 LAB — CBC AND DIFFERENTIAL
HCT: 32 — AB (ref 36–46)
Hemoglobin: 10.9 — AB (ref 12.0–16.0)
Neutrophils Absolute: 2.11
Platelets: 149 10*3/uL — AB (ref 150–400)
WBC: 3.2

## 2021-08-31 LAB — CBC: RBC: 3.58 — AB (ref 3.87–5.11)

## 2021-08-31 NOTE — Patient Instructions (Signed)
Epoetin Alfa injection ?What is this medication? ?EPOETIN ALFA (e POE e tin AL fa) helps your body make more red blood cells. This medicine is used to treat anemia caused by chronic kidney disease, cancer chemotherapy, or HIV-therapy. It may also be used before surgery if you have anemia. ?This medicine may be used for other purposes; ask your health care provider or pharmacist if you have questions. ?COMMON BRAND NAME(S): Epogen, Procrit, Retacrit ?What should I tell my care team before I take this medication? ?They need to know if you have any of these conditions: ?cancer ?heart disease ?high blood pressure ?history of blood clots ?history of stroke ?low levels of folate, iron, or vitamin B12 in the blood ?seizures ?an unusual or allergic reaction to erythropoietin, albumin, benzyl alcohol, hamster proteins, other medicines, foods, dyes, or preservatives ?pregnant or trying to get pregnant ?breast-feeding ?How should I use this medication? ?This medicine is for injection into a vein or under the skin. It is usually given by a health care professional in a hospital or clinic setting. ?If you get this medicine at home, you will be taught how to prepare and give this medicine. Use exactly as directed. Take your medicine at regular intervals. Do not take your medicine more often than directed. ?It is important that you put your used needles and syringes in a special sharps container. Do not put them in a trash can. If you do not have a sharps container, call your pharmacist or healthcare provider to get one. ?A special MedGuide will be given to you by the pharmacist with each prescription and refill. Be sure to read this information carefully each time. ?Talk to your pediatrician regarding the use of this medicine in children. While this drug may be prescribed for selected conditions, precautions do apply. ?Overdosage: If you think you have taken too much of this medicine contact a poison control center or emergency  room at once. ?NOTE: This medicine is only for you. Do not share this medicine with others. ?What if I miss a dose? ?If you miss a dose, take it as soon as you can. If it is almost time for your next dose, take only that dose. Do not take double or extra doses. ?What may interact with this medication? ?Interactions have not been studied. ?This list may not describe all possible interactions. Give your health care provider a list of all the medicines, herbs, non-prescription drugs, or dietary supplements you use. Also tell them if you smoke, drink alcohol, or use illegal drugs. Some items may interact with your medicine. ?What should I watch for while using this medication? ?Your condition will be monitored carefully while you are receiving this medicine. ?You may need blood work done while you are taking this medicine. ?This medicine may cause a decrease in vitamin B6. You should make sure that you get enough vitamin B6 while you are taking this medicine. Discuss the foods you eat and the vitamins you take with your health care professional. ?What side effects may I notice from receiving this medication? ?Side effects that you should report to your doctor or health care professional as soon as possible: ?allergic reactions like skin rash, itching or hives, swelling of the face, lips, or tongue ?seizures ?signs and symptoms of a blood clot such as breathing problems; changes in vision; chest pain; severe, sudden headache; pain, swelling, warmth in the leg; trouble speaking; sudden numbness or weakness of the face, arm or leg ?signs and symptoms of a stroke like   changes in vision; confusion; trouble speaking or understanding; severe headaches; sudden numbness or weakness of the face, arm or leg; trouble walking; dizziness; loss of balance or coordination ?Side effects that usually do not require medical attention (report to your doctor or health care professional if they continue or are  bothersome): ?chills ?cough ?dizziness ?fever ?headaches ?joint pain ?muscle cramps ?muscle pain ?nausea, vomiting ?pain, redness, or irritation at site where injected ?This list may not describe all possible side effects. Call your doctor for medical advice about side effects. You may report side effects to FDA at 1-800-FDA-1088. ?Where should I keep my medication? ?Keep out of the reach of children. ?Store in a refrigerator between 2 and 8 degrees C (36 and 46 degrees F). Do not freeze or shake. Throw away any unused portion if using a single-dose vial. Multi-dose vials can be kept in the refrigerator for up to 21 days after the initial dose. Throw away unused medicine. ?NOTE: This sheet is a summary. It may not cover all possible information. If you have questions about this medicine, talk to your doctor, pharmacist, or health care provider. ?? 2023 Elsevier/Gold Standard (2016-10-19 00:00:00) ? ?

## 2021-08-31 NOTE — Progress Notes (Signed)
HGB 10.9- Ulice Dash Pharm D spoke with patient regarding risks and benefits of Retacrit- Retacrit held and patient scheduled for recheck in 2 weeks- Patient states understanding to call for any change in physical condition.

## 2021-09-03 LAB — SOLUBLE TRANSFERRIN RECEPTOR: Transferrin Receptor: 22.7 nmol/L (ref 12.2–27.3)

## 2021-09-14 ENCOUNTER — Other Ambulatory Visit: Payer: Self-pay | Admitting: Pharmacist

## 2021-09-14 ENCOUNTER — Inpatient Hospital Stay: Payer: Medicare PPO

## 2021-09-14 DIAGNOSIS — C8309 Small cell B-cell lymphoma, extranodal and solid organ sites: Secondary | ICD-10-CM

## 2021-09-14 LAB — CBC AND DIFFERENTIAL
HCT: 30 — AB (ref 36–46)
Hemoglobin: 10.3 — AB (ref 12.0–16.0)
Neutrophils Absolute: 1.85
Platelets: 129 10*3/uL — AB (ref 150–400)
WBC: 2.8

## 2021-09-14 LAB — CBC: RBC: 3.39 — AB (ref 3.87–5.11)

## 2021-09-14 NOTE — Progress Notes (Signed)
1058 The patient wanted to hold her retacrit today for hgb = 10.3. The patient understands to call if she has any symptoms of shortness of breath. Informed Georgena Spurling.

## 2021-09-15 ENCOUNTER — Other Ambulatory Visit: Payer: Self-pay | Admitting: Cardiology

## 2021-09-28 ENCOUNTER — Other Ambulatory Visit: Payer: Medicare PPO

## 2021-09-28 ENCOUNTER — Ambulatory Visit: Payer: Medicare PPO

## 2021-10-07 ENCOUNTER — Other Ambulatory Visit: Payer: Self-pay | Admitting: Pharmacist

## 2021-10-12 ENCOUNTER — Other Ambulatory Visit: Payer: Self-pay | Admitting: Oncology

## 2021-10-12 DIAGNOSIS — C8309 Small cell B-cell lymphoma, extranodal and solid organ sites: Secondary | ICD-10-CM

## 2021-10-12 NOTE — Progress Notes (Signed)
[facility]  [address] 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  She comes in today for routine follow-up.  Since her last visit, the patient has felt much better.  Her energy level is essentially back to where she recalls it being before her anemia and subsequent lymphoma were found.  She denies having any B symptoms or lymphadenopathy which concerned her for recurrent lymphoma.  PHYSICAL EXAM:  Blood pressure (!) 169/71, pulse (!) 57, temperature 98.4 F (36.9 C), resp. rate 14, height '5\' 5"'$  (1.651 m), weight 159 lb (72.1 kg), SpO2 100 %. Wt Readings from Last 3 Encounters:  10/13/21 159 lb (72.1 kg)  09/14/21 158 lb (71.7 kg)  08/03/21 150 lb 12 oz (68.4 kg)   Body mass index is 26.46 kg/m. Performance status (ECOG): 1 - Symptomatic but completely ambulatory Physical Exam Constitutional:      Appearance: Normal appearance. She is not ill-appearing.  HENT:     Mouth/Throat:     Mouth: Mucous membranes are moist.     Pharynx: Oropharynx is clear. No oropharyngeal exudate or posterior oropharyngeal erythema.  Cardiovascular:     Rate and Rhythm: Normal rate and regular rhythm.     Heart sounds: No murmur heard.    No friction rub. No gallop.  Pulmonary:     Effort: Pulmonary effort is normal. No respiratory distress.     Breath sounds: Normal breath sounds. No wheezing, rhonchi or rales.  Abdominal:     General: Bowel sounds are normal. There is no distension.     Palpations: Abdomen is soft. There is no mass.     Tenderness: There is no abdominal tenderness.  Musculoskeletal:        General: No swelling.     Right lower leg: No edema.     Left lower leg: No edema.  Lymphadenopathy:     Cervical: No cervical  adenopathy.     Upper Body:     Right upper body: No supraclavicular or axillary adenopathy.     Left upper body: No supraclavicular or axillary adenopathy.     Lower Body: No right inguinal adenopathy. No left inguinal adenopathy.  Skin:    General: Skin is warm.     Coloration: Skin is not jaundiced.     Findings: No lesion or rash.  Neurological:     General: No focal deficit present.     Mental Status: She is alert and oriented to person, place, and time. Mental status is at baseline.  Psychiatric:        Mood and Affect: Mood normal.        Behavior: Behavior normal.        Thought Content: Thought content normal.    LABS:  Latest Reference Range & Units 10/13/21 00:00  WBC  3.4 (E)  RBC 3.87 - 5.11  3.83 ! (E)  Hemoglobin 12.0 - 16.0  12.0 (E)  HCT 36 - 46  35 ! (E)  Platelets 150 - 400 K/uL 136 ! (E)  NEUT#  2.14 (E)  !: Data is abnormal (E): External lab result  ASSESSMENT & PLAN:  Assessment/Plan:  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  I am very pleased with her CBC today as her hemoglobin of 12 is the highest it has been since I been following her.  This likely represents the continued positive effects her Bendamustine/Rituxan have had in eradicating the lymphoma within her bone marrow.  Clinically, the patient is doing great.  Her monoclonal parameters studies are pending.  We will notify her of these results when they become available later this week.  Otherwise, I will see her back in 4 months for repeat clinical assessment.  The patient understands all the plans discussed today and is in agreement with them.    Domitila Stetler Macarthur Critchley, MD

## 2021-10-13 ENCOUNTER — Inpatient Hospital Stay: Payer: Medicare PPO

## 2021-10-13 ENCOUNTER — Telehealth: Payer: Self-pay | Admitting: Oncology

## 2021-10-13 ENCOUNTER — Inpatient Hospital Stay: Payer: Medicare PPO | Attending: Oncology | Admitting: Oncology

## 2021-10-13 ENCOUNTER — Other Ambulatory Visit: Payer: Self-pay | Admitting: Oncology

## 2021-10-13 ENCOUNTER — Ambulatory Visit: Payer: Medicare PPO

## 2021-10-13 ENCOUNTER — Other Ambulatory Visit: Payer: Self-pay | Admitting: Hematology and Oncology

## 2021-10-13 VITALS — BP 169/71 | HR 57 | Temp 98.4°F | Resp 14 | Ht 65.0 in | Wt 159.0 lb

## 2021-10-13 DIAGNOSIS — N189 Chronic kidney disease, unspecified: Secondary | ICD-10-CM | POA: Diagnosis not present

## 2021-10-13 DIAGNOSIS — C8309 Small cell B-cell lymphoma, extranodal and solid organ sites: Secondary | ICD-10-CM

## 2021-10-13 DIAGNOSIS — C8300 Small cell B-cell lymphoma, unspecified site: Secondary | ICD-10-CM | POA: Insufficient documentation

## 2021-10-13 DIAGNOSIS — D508 Other iron deficiency anemias: Secondary | ICD-10-CM | POA: Diagnosis not present

## 2021-10-13 LAB — CBC AND DIFFERENTIAL
HCT: 35 — AB (ref 36–46)
Hemoglobin: 12 (ref 12.0–16.0)
Neutrophils Absolute: 2.14
Platelets: 136 10*3/uL — AB (ref 150–400)
WBC: 3.4

## 2021-10-13 LAB — HEPATIC FUNCTION PANEL
ALT: 27 U/L (ref 7–35)
AST: 28 (ref 13–35)
Alkaline Phosphatase: 68 (ref 25–125)
Bilirubin, Total: 1.2

## 2021-10-13 LAB — BASIC METABOLIC PANEL
BUN: 32 — AB (ref 4–21)
CO2: 24 — AB (ref 13–22)
Chloride: 106 (ref 99–108)
Creatinine: 1.1 (ref 0.5–1.1)
Glucose: 97
Potassium: 4.7 mEq/L (ref 3.5–5.1)
Sodium: 136 — AB (ref 137–147)

## 2021-10-13 LAB — CBC: RBC: 3.83 — AB (ref 3.87–5.11)

## 2021-10-13 LAB — COMPREHENSIVE METABOLIC PANEL
Albumin: 4.6 (ref 3.5–5.0)
Calcium: 9.4 (ref 8.7–10.7)

## 2021-10-13 MED ORDER — HEPARIN SOD (PORK) LOCK FLUSH 100 UNIT/ML IV SOLN
500.0000 [IU] | Freq: Once | INTRAVENOUS | Status: AC | PRN
  Administered 2021-10-13: 500 [IU]

## 2021-10-13 MED ORDER — SODIUM CHLORIDE 0.9% FLUSH
10.0000 mL | INTRAVENOUS | Status: DC | PRN
  Administered 2021-10-13: 10 mL

## 2021-10-13 NOTE — Telephone Encounter (Signed)
Per 10/13/21 los next appt scheduled and confirmed with patient

## 2021-10-14 LAB — KAPPA/LAMBDA LIGHT CHAINS
Kappa free light chain: 122.4 mg/L — ABNORMAL HIGH (ref 3.3–19.4)
Kappa, lambda light chain ratio: 17.49 — ABNORMAL HIGH (ref 0.26–1.65)
Lambda free light chains: 7 mg/L (ref 5.7–26.3)

## 2021-10-16 LAB — MULTIPLE MYELOMA PANEL, SERUM
Albumin SerPl Elph-Mcnc: 4 g/dL (ref 2.9–4.4)
Albumin/Glob SerPl: 2 — ABNORMAL HIGH (ref 0.7–1.7)
Alpha 1: 0.2 g/dL (ref 0.0–0.4)
Alpha2 Glob SerPl Elph-Mcnc: 0.5 g/dL (ref 0.4–1.0)
B-Globulin SerPl Elph-Mcnc: 0.8 g/dL (ref 0.7–1.3)
Gamma Glob SerPl Elph-Mcnc: 0.6 g/dL (ref 0.4–1.8)
Globulin, Total: 2.1 g/dL — ABNORMAL LOW (ref 2.2–3.9)
IgA: 8 mg/dL — ABNORMAL LOW (ref 64–422)
IgG (Immunoglobin G), Serum: 726 mg/dL (ref 586–1602)
IgM (Immunoglobulin M), Srm: 7 mg/dL — ABNORMAL LOW (ref 26–217)
M Protein SerPl Elph-Mcnc: 0.4 g/dL — ABNORMAL HIGH
Total Protein ELP: 6.1 g/dL (ref 6.0–8.5)

## 2021-10-26 ENCOUNTER — Other Ambulatory Visit: Payer: Medicare PPO

## 2021-10-26 ENCOUNTER — Ambulatory Visit: Payer: Medicare PPO

## 2021-11-23 ENCOUNTER — Ambulatory Visit: Payer: Medicare PPO

## 2021-11-23 ENCOUNTER — Other Ambulatory Visit: Payer: Medicare PPO

## 2022-01-13 DIAGNOSIS — E538 Deficiency of other specified B group vitamins: Secondary | ICD-10-CM

## 2022-01-13 HISTORY — DX: Deficiency of other specified B group vitamins: E53.8

## 2022-02-03 ENCOUNTER — Inpatient Hospital Stay: Payer: Medicare PPO | Attending: Oncology

## 2022-02-03 DIAGNOSIS — M79605 Pain in left leg: Secondary | ICD-10-CM | POA: Diagnosis not present

## 2022-02-03 DIAGNOSIS — M25552 Pain in left hip: Secondary | ICD-10-CM | POA: Insufficient documentation

## 2022-02-03 DIAGNOSIS — C8309 Small cell B-cell lymphoma, extranodal and solid organ sites: Secondary | ICD-10-CM

## 2022-02-03 DIAGNOSIS — C8300 Small cell B-cell lymphoma, unspecified site: Secondary | ICD-10-CM | POA: Insufficient documentation

## 2022-02-03 LAB — CBC WITH DIFFERENTIAL (CANCER CENTER ONLY)
Abs Immature Granulocytes: 0.01 10*3/uL (ref 0.00–0.07)
Basophils Absolute: 0 10*3/uL (ref 0.0–0.1)
Basophils Relative: 1 %
Eosinophils Absolute: 0.1 10*3/uL (ref 0.0–0.5)
Eosinophils Relative: 3 %
HCT: 34.2 % — ABNORMAL LOW (ref 36.0–46.0)
Hemoglobin: 10.9 g/dL — ABNORMAL LOW (ref 12.0–15.0)
Immature Granulocytes: 0 %
Lymphocytes Relative: 18 %
Lymphs Abs: 0.7 10*3/uL (ref 0.7–4.0)
MCH: 30.9 pg (ref 26.0–34.0)
MCHC: 31.9 g/dL (ref 30.0–36.0)
MCV: 96.9 fL (ref 80.0–100.0)
Monocytes Absolute: 0.5 10*3/uL (ref 0.1–1.0)
Monocytes Relative: 12 %
Neutro Abs: 2.5 10*3/uL (ref 1.7–7.7)
Neutrophils Relative %: 66 %
Platelet Count: 162 10*3/uL (ref 150–400)
RBC: 3.53 MIL/uL — ABNORMAL LOW (ref 3.87–5.11)
RDW: 13.4 % (ref 11.5–15.5)
WBC Count: 3.8 10*3/uL — ABNORMAL LOW (ref 4.0–10.5)
nRBC: 0 % (ref 0.0–0.2)

## 2022-02-03 LAB — CMP (CANCER CENTER ONLY)
ALT: 16 U/L (ref 0–44)
AST: 21 U/L (ref 15–41)
Albumin: 4.2 g/dL (ref 3.5–5.0)
Alkaline Phosphatase: 57 U/L (ref 38–126)
Anion gap: 7 (ref 5–15)
BUN: 24 mg/dL — ABNORMAL HIGH (ref 8–23)
CO2: 28 mmol/L (ref 22–32)
Calcium: 9.2 mg/dL (ref 8.9–10.3)
Chloride: 109 mmol/L (ref 98–111)
Creatinine: 1.23 mg/dL — ABNORMAL HIGH (ref 0.44–1.00)
GFR, Estimated: 46 mL/min — ABNORMAL LOW (ref >60)
Glucose, Bld: 98 mg/dL (ref 70–99)
Potassium: 4.3 mmol/L (ref 3.5–5.1)
Sodium: 144 mmol/L (ref 135–145)
Total Bilirubin: 1 mg/dL (ref 0.3–1.2)
Total Protein: 6.8 g/dL (ref 6.5–8.1)

## 2022-02-04 LAB — KAPPA/LAMBDA LIGHT CHAINS
Kappa free light chain: 142.4 mg/L — ABNORMAL HIGH (ref 3.3–19.4)
Kappa, lambda light chain ratio: 16.37 — ABNORMAL HIGH (ref 0.26–1.65)
Lambda free light chains: 8.7 mg/L (ref 5.7–26.3)

## 2022-02-08 LAB — MULTIPLE MYELOMA PANEL, SERUM
Albumin SerPl Elph-Mcnc: 3.8 g/dL (ref 2.9–4.4)
Albumin/Glob SerPl: 1.8 — ABNORMAL HIGH (ref 0.7–1.7)
Alpha 1: 0.2 g/dL (ref 0.0–0.4)
Alpha2 Glob SerPl Elph-Mcnc: 0.5 g/dL (ref 0.4–1.0)
B-Globulin SerPl Elph-Mcnc: 0.7 g/dL (ref 0.7–1.3)
Gamma Glob SerPl Elph-Mcnc: 0.7 g/dL (ref 0.4–1.8)
Globulin, Total: 2.2 g/dL (ref 2.2–3.9)
IgA: 11 mg/dL — ABNORMAL LOW (ref 64–422)
IgG (Immunoglobin G), Serum: 776 mg/dL (ref 586–1602)
IgM (Immunoglobulin M), Srm: 10 mg/dL — ABNORMAL LOW (ref 26–217)
M Protein SerPl Elph-Mcnc: 0.4 g/dL — ABNORMAL HIGH
Total Protein ELP: 6 g/dL (ref 6.0–8.5)

## 2022-02-08 NOTE — Progress Notes (Signed)
[facility]  [address] [place],  [place]  [address] [phone]  Clinic Day:  02/09/2022  Referring physician: Algis Greenhouse, MD  HISTORY OF PRESENT ILLNESS:  The patient is a 76 y.o. 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  She comes in today for routine follow-up.  Since her last visit, the patient has been doing fairly well.  She denies having any B symptoms or lymphadenopathy which concerned her for recurrent lymphoma.  The major health issue that impacts her daily quality of life he has left hip pain that radiates down the side of her leg.  He has already been seen by orthopedics for this issue, for which in the past have not helped with this discomfort.  PHYSICAL EXAM:  Blood pressure (!) 199/86, pulse (!) 57, temperature 99.2 F (37.3 C), resp. rate 16, height '5\' 5"'$  (1.651 m), weight 170 lb 1.6 oz (77.2 kg), SpO2 100 %. Wt Readings from Last 3 Encounters:  02/09/22 170 lb 1.6 oz (77.2 kg)  10/13/21 159 lb (72.1 kg)  09/14/21 158 lb (71.7 kg)   Body mass index is 28.31 kg/m. Performance status (ECOG): 1 - Symptomatic but completely ambulatory Physical Exam Constitutional:      Appearance: Normal appearance. She is not ill-appearing.  HENT:     Mouth/Throat:     Mouth: Mucous membranes are moist.     Pharynx: Oropharynx is clear. No oropharyngeal exudate or posterior oropharyngeal erythema.  Cardiovascular:     Rate and Rhythm: Normal rate and regular rhythm.     Heart sounds: No murmur heard.    No friction rub. No gallop.  Pulmonary:     Effort: Pulmonary effort is normal. No respiratory distress.     Breath sounds: Normal breath sounds. No wheezing, rhonchi or rales.  Abdominal:     General: Bowel sounds are normal. There is no distension.     Palpations: Abdomen is soft. There is no mass.     Tenderness: There is no abdominal tenderness.  Musculoskeletal:         General: No swelling.     Right lower leg: No edema.     Left lower leg: No edema.  Lymphadenopathy:     Cervical: No cervical adenopathy.     Upper Body:     Right upper body: No supraclavicular or axillary adenopathy.     Left upper body: No supraclavicular or axillary adenopathy.     Lower Body: No right inguinal adenopathy. No left inguinal adenopathy.  Skin:    General: Skin is warm.     Coloration: Skin is not jaundiced.     Findings: No lesion or rash.  Neurological:     General: No focal deficit present.     Mental Status: She is alert and oriented to person, place, and time. Mental status is at baseline.  Psychiatric:        Mood and Affect: Mood normal.        Behavior: Behavior normal.        Thought Content: Thought content normal.    LABS:  Latest Reference Range & Units 02/03/22 10:17  WBC 4.0 - 10.5 K/uL 3.8 (L)  RBC 3.87 - 5.11 MIL/uL 3.53 (L)  Hemoglobin 12.0 - 15.0 g/dL 10.9 (L)  HCT 36.0 - 46.0 % 34.2 (L)  MCV 80.0 - 100.0 fL 96.9  MCH 26.0 - 34.0 pg 30.9  MCHC 30.0 - 36.0  g/dL 31.9  RDW 11.5 - 15.5 % 13.4  Platelets 150 - 400 K/uL 162  nRBC 0.0 - 0.2 % 0.0  Neutrophils % 66  Lymphocytes % 18  Monocytes Relative % 12  Eosinophil % 3  Basophil % 1  Immature Granulocytes % 0  (L): Data is abnormally low  Latest Reference Range & Units 02/03/22 10:17  Sodium 135 - 145 mmol/L 144  Potassium 3.5 - 5.1 mmol/L 4.3  Chloride 98 - 111 mmol/L 109  CO2 22 - 32 mmol/L 28  Glucose 70 - 99 mg/dL 98  BUN 8 - 23 mg/dL 24 (H)  Creatinine 0.44 - 1.00 mg/dL 1.23 (H)  Calcium 8.9 - 10.3 mg/dL 9.2  Anion gap 5 - 15  7  Alkaline Phosphatase 38 - 126 U/L 57  Albumin 3.5 - 5.0 g/dL 4.2  AST 15 - 41 U/L 21  ALT 0 - 44 U/L 16  Total Protein 6.5 - 8.1 g/dL 6.8  Total Bilirubin 0.3 - 1.2 mg/dL 1.0  GFR, Est Non African American >60 mL/min 46 (L)  (H): Data is abnormally high (L): Data is abnormally low   Latest Reference Range & Units 10/13/21 10:40 02/03/22 10:17   M Protein SerPl Elph-Mcnc Not Observed g/dL 0.4 (H) (C) 0.4 (H) (C)  (H): Data is abnormally high (C): Corrected Her initial M protein level was 2.5 g   Latest Reference Range & Units 10/13/21 10:40 02/03/22 10:17  IgG (Immunoglobin G), Serum 586 - 1,602 mg/dL [phone]  Her initial IgG level was 3,255.   Latest Reference Range & Units 10/13/21 10:40 02/03/22 10:17  Kappa free light chain 3.3 - 19.4 mg/L 122.4 (H) 142.4 (H)  (H): Data is abnormally high Her initial kappa light chain level was 1430.5  ASSESSMENT & PLAN:  Assessment/Plan:  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  I am pleased as her recent CBC does not show any significant cytopenias.  Furthermore, her monoclonal parameters studies are holding stable.  Although she has B-cell lymphoma, her monoclonal parameters have historically shown how her disease has been behaving within her body.  From a lymphoma standpoint, the patient is doing well.  With respect to her left hip and leg pain, I did write a prescription for tramadol, for which she will be given 50 tablets.  She also knows to use ibuprofen as well to help with this pain.  She also knows to stay in contact with orthopedics with respect to possibly getting a more detailed scan of her left hip/femur if her pain persist.  Otherwise, I will see her back in 4 months for repeat clinical assessment.  CT scans, in addition to labs, will be done before her next visit to ascertain her new disease baseline.  The patient understands all the plans discussed today and is in agreement with them.    Tramaine Snell Macarthur Critchley, MD

## 2022-02-09 ENCOUNTER — Inpatient Hospital Stay: Payer: Medicare PPO | Admitting: Oncology

## 2022-02-09 ENCOUNTER — Other Ambulatory Visit: Payer: Self-pay | Admitting: Oncology

## 2022-02-09 ENCOUNTER — Telehealth: Payer: Self-pay | Admitting: Oncology

## 2022-02-09 VITALS — BP 199/86 | HR 57 | Temp 99.2°F | Resp 16 | Ht 65.0 in | Wt 170.1 lb

## 2022-02-09 DIAGNOSIS — N189 Chronic kidney disease, unspecified: Secondary | ICD-10-CM

## 2022-02-09 DIAGNOSIS — C8300 Small cell B-cell lymphoma, unspecified site: Secondary | ICD-10-CM | POA: Diagnosis not present

## 2022-02-09 DIAGNOSIS — C8208 Follicular lymphoma grade I, lymph nodes of multiple sites: Secondary | ICD-10-CM | POA: Diagnosis not present

## 2022-02-09 DIAGNOSIS — C8309 Small cell B-cell lymphoma, extranodal and solid organ sites: Secondary | ICD-10-CM

## 2022-02-09 DIAGNOSIS — D508 Other iron deficiency anemias: Secondary | ICD-10-CM

## 2022-02-09 MED ORDER — HEPARIN SOD (PORK) LOCK FLUSH 100 UNIT/ML IV SOLN
500.0000 [IU] | Freq: Once | INTRAVENOUS | Status: AC | PRN
  Administered 2022-02-09: 500 [IU]

## 2022-02-09 MED ORDER — TRAMADOL HCL 50 MG PO TABS: 50.0000 mg | ORAL_TABLET | Freq: Four times a day (QID) | ORAL | 0 refills | Status: DC | PRN

## 2022-02-09 MED ORDER — SODIUM CHLORIDE 0.9% FLUSH
10.0000 mL | INTRAVENOUS | Status: AC | PRN
  Administered 2022-02-09: 10 mL

## 2022-02-09 NOTE — Telephone Encounter (Signed)
02/09/22 Next appt scheduled and confirmed with patient

## 2022-02-11 ENCOUNTER — Telehealth: Payer: Self-pay | Admitting: Oncology

## 2022-02-11 NOTE — Telephone Encounter (Signed)
Pt has been notified that we will be calling her closer to April 2024 with appt information for CT Scan.    Scheduling Message Entered by Lavera Guise A on 02/09/2022 at  4:28 PM Priority: Routine <No visit type provided>  Department: [facility]  Provider:  Scheduling Notes:  Please schedule CT scans on 06-10-22 for pt.  Also, let pt know about these scans being scheduled.

## 2022-03-30 ENCOUNTER — Other Ambulatory Visit: Payer: Self-pay

## 2022-04-01 ENCOUNTER — Ambulatory Visit: Payer: Medicare PPO | Admitting: Cardiology

## 2022-05-24 ENCOUNTER — Telehealth: Payer: Self-pay | Admitting: Oncology

## 2022-05-24 NOTE — Telephone Encounter (Signed)
3/25/24Spoke with patient and scheduled ct scan on 06/10/22@11am 

## 2022-06-04 ENCOUNTER — Inpatient Hospital Stay: Payer: Medicare PPO | Attending: Oncology

## 2022-06-04 DIAGNOSIS — C8300 Small cell B-cell lymphoma, unspecified site: Secondary | ICD-10-CM | POA: Insufficient documentation

## 2022-06-04 DIAGNOSIS — C8309 Small cell B-cell lymphoma, extranodal and solid organ sites: Secondary | ICD-10-CM

## 2022-06-04 LAB — CMP (CANCER CENTER ONLY)
ALT: 14 U/L (ref 0–44)
AST: 19 U/L (ref 15–41)
Albumin: 3.7 g/dL (ref 3.5–5.0)
Alkaline Phosphatase: 60 U/L (ref 38–126)
Anion gap: 5 (ref 5–15)
BUN: 18 mg/dL (ref 8–23)
CO2: 25 mmol/L (ref 22–32)
Calcium: 8.8 mg/dL — ABNORMAL LOW (ref 8.9–10.3)
Chloride: 111 mmol/L (ref 98–111)
Creatinine: 0.94 mg/dL (ref 0.44–1.00)
GFR, Estimated: >60 mL/min (ref >60)
Glucose, Bld: 115 mg/dL — ABNORMAL HIGH (ref 70–99)
Potassium: 4 mmol/L (ref 3.5–5.1)
Sodium: 141 mmol/L (ref 135–145)
Total Bilirubin: 0.7 mg/dL (ref 0.3–1.2)
Total Protein: 6.6 g/dL (ref 6.5–8.1)

## 2022-06-04 LAB — CBC WITH DIFFERENTIAL (CANCER CENTER ONLY)
Abs Immature Granulocytes: 0.02 10*3/uL (ref 0.00–0.07)
Basophils Absolute: 0 10*3/uL (ref 0.0–0.1)
Basophils Relative: 1 %
Eosinophils Absolute: 0.1 10*3/uL (ref 0.0–0.5)
Eosinophils Relative: 2 %
HCT: 30.4 % — ABNORMAL LOW (ref 36.0–46.0)
Hemoglobin: 9.4 g/dL — ABNORMAL LOW (ref 12.0–15.0)
Immature Granulocytes: 1 %
Lymphocytes Relative: 17 %
Lymphs Abs: 0.7 10*3/uL (ref 0.7–4.0)
MCH: 29.3 pg (ref 26.0–34.0)
MCHC: 30.9 g/dL (ref 30.0–36.0)
MCV: 94.7 fL (ref 80.0–100.0)
Monocytes Absolute: 0.4 10*3/uL (ref 0.1–1.0)
Monocytes Relative: 9 %
Neutro Abs: 2.9 10*3/uL (ref 1.7–7.7)
Neutrophils Relative %: 70 %
Platelet Count: 223 10*3/uL (ref 150–400)
RBC: 3.21 MIL/uL — ABNORMAL LOW (ref 3.87–5.11)
RDW: 15 % (ref 11.5–15.5)
WBC Count: 4.2 10*3/uL (ref 4.0–10.5)
nRBC: 0 % (ref 0.0–0.2)

## 2022-06-04 LAB — LACTATE DEHYDROGENASE: LDH: 149 U/L (ref 98–192)

## 2022-06-07 LAB — KAPPA/LAMBDA LIGHT CHAINS
Kappa free light chain: 109.8 mg/L — ABNORMAL HIGH (ref 3.3–19.4)
Kappa, lambda light chain ratio: 4.58 — ABNORMAL HIGH (ref 0.26–1.65)
Lambda free light chains: 24 mg/L (ref 5.7–26.3)

## 2022-06-09 ENCOUNTER — Encounter: Payer: Self-pay | Admitting: Cardiology

## 2022-06-09 ENCOUNTER — Ambulatory Visit: Payer: Medicare PPO | Attending: Cardiology | Admitting: Cardiology

## 2022-06-09 VITALS — BP 164/60 | HR 56 | Ht 65.0 in | Wt 167.8 lb

## 2022-06-09 DIAGNOSIS — I2584 Coronary atherosclerosis due to calcified coronary lesion: Secondary | ICD-10-CM

## 2022-06-09 DIAGNOSIS — I7090 Unspecified atherosclerosis: Secondary | ICD-10-CM

## 2022-06-09 DIAGNOSIS — I10 Essential (primary) hypertension: Secondary | ICD-10-CM

## 2022-06-09 DIAGNOSIS — E782 Mixed hyperlipidemia: Secondary | ICD-10-CM | POA: Diagnosis not present

## 2022-06-09 DIAGNOSIS — I2510 Atherosclerotic heart disease of native coronary artery without angina pectoris: Secondary | ICD-10-CM | POA: Insufficient documentation

## 2022-06-09 HISTORY — DX: Atherosclerotic heart disease of native coronary artery without angina pectoris: I25.10

## 2022-06-09 LAB — MULTIPLE MYELOMA PANEL, SERUM
Albumin SerPl Elph-Mcnc: 3.3 g/dL (ref 2.9–4.4)
Albumin/Glob SerPl: 1.3 (ref 0.7–1.7)
Alpha 1: 0.3 g/dL (ref 0.0–0.4)
Alpha2 Glob SerPl Elph-Mcnc: 0.6 g/dL (ref 0.4–1.0)
B-Globulin SerPl Elph-Mcnc: 0.7 g/dL (ref 0.7–1.3)
Gamma Glob SerPl Elph-Mcnc: 0.9 g/dL (ref 0.4–1.8)
Globulin, Total: 2.6 g/dL (ref 2.2–3.9)
IgA: 41 mg/dL — ABNORMAL LOW (ref 64–422)
IgG (Immunoglobin G), Serum: 1028 mg/dL (ref 586–1602)
IgM (Immunoglobulin M), Srm: 96 mg/dL (ref 26–217)
M Protein SerPl Elph-Mcnc: 0.3 g/dL — ABNORMAL HIGH
Total Protein ELP: 5.9 g/dL — ABNORMAL LOW (ref 6.0–8.5)

## 2022-06-09 NOTE — Patient Instructions (Signed)
Medication Instructions:  Your physician recommends that you continue on your current medications as directed. Please refer to the Current Medication list given to you today.  *If you need a refill on your cardiac medications before your next appointment, please call your pharmacy*   Lab Work: None ordered If you have labs (blood work) drawn today and your tests are completely normal, you will receive your results only by: MyChart Message (if you have MyChart) OR A paper copy in the mail If you have any lab test that is abnormal or we need to change your treatment, we will call you to review the results.   Testing/Procedures: None ordered   Follow-Up: At [facility], you and your health needs are our priority.  As part of our continuing mission to provide you with exceptional heart care, we have created designated Provider Care Teams.  These Care Teams include your primary Cardiologist (physician) and Advanced Practice Providers (APPs -  Physician Assistants and Nurse Practitioners) who all work together to provide you with the care you need, when you need it.  We recommend signing up for the patient portal called "MyChart".  Sign up information is provided on this After Visit Summary.  MyChart is used to connect with patients for Virtual Visits (Telemedicine).  Patients are able to view lab/test results, encounter notes, upcoming appointments, etc.  Non-urgent messages can be sent to your provider as well.   To learn more about what you can do with MyChart, go to [email or web address].    Your next appointment:   9 month(s)  The format for your next appointment:   In Person  Provider:   Rajan Revankar, MD    Other Instructions none  Important Information About Sugar       

## 2022-06-09 NOTE — Progress Notes (Signed)
Cardiology Office Note:    Date:  06/09/2022   ID:  Alisha Cochran, Alisha Cochran 1945/05/19, MRN [id number]  PCP:  Olive Bass, MD  Cardiologist:  Garwin Brothers, MD   Referring MD: Olive Bass, MD    ASSESSMENT:    1. Atherosclerotic vascular disease   2. Essential hypertension   3. Coronary artery calcification   4. Mixed dyslipidemia    PLAN:    In order of problems listed above:  Coronary artery calcification: Secondary prevention stressed with the patient.  Importance of compliance with diet medication stressed and she vocalized understanding.  She was advised to walk at least half an hour a day 5 days a week and she promises to do so. Essential hypertension: Blood pressure stable and diet was emphasized.  Lifestyle modification urged.  Her blood pressure is elevated today.  She has an element of whitecoat hypertension.  She has kept an extensive log of blood pressures at home and they are fine.  At times that they are in the range of 120/70.  So I do not want to be more aggressive with blood pressure lowering for fear of hypotension.  I discussed this with her and she vocalized understanding and questions were answered to her satisfaction. Mixed dyslipidemia: On lipid-lowering medications followed by primary care.  Lipids reviewed and discussed with patient and they are fine. Patient will be seen in follow-up appointment in 6 months or earlier if the patient has any concerns.    Medication Adjustments/Labs and Tests Ordered: Current medicines are reviewed at length with the patient today.  Concerns regarding medicines are outlined above.  No orders of the defined types were placed in this encounter.  No orders of the defined types were placed in this encounter.    No chief complaint on file.    History of Present Illness:    Alisha Cochran is a 77 y.o. female.  Patient has past medical history of coronary artery calcification, essential hypertension,  mixed dyslipidemia.  She denies any problems at this time and takes care of activities of daily living.  No chest pain orthopnea or PND.  At the time of my evaluation, the patient is alert awake oriented and in no distress.  Past Medical History:  Diagnosis Date   Acute pain of right knee 09/04/2019   Formatting of this note might be different from the original. 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 Initially GI, then H/O   Benign hypertension 07/21/2015   Bronchitis 07/13/2021   Formatting of this note might be different from the original. 07/13/2021   Bruising 07/04/2020   Formatting of this note might be different from the original. 07/04/2020: arms   Chronic fatigue 07/13/2021   Formatting of this note might be different from the original. 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 2014: MRI DDD L4/5   Diverticulosis of colon 09/15/2015   2014, 2020: diverticulitis   Dyspnea on exertion 10/25/2018   2020   Essential hypertension 05/08/2019   Family history of premature  coronary artery disease 04/12/2018   Fibrocystic breast disease 09/15/2015   Herpes zoster without complication 01/15/2019   2020: left T12   History of TIA (transient ischemic attack) 07/23/2020   Insomnia 05/21/2015   Intermittent claudication 07/14/2020   Lumbar radiculopathy 09/25/2012   Overview:  2008: onset 2009: MRI DDD with tear and protrusion L5-S1, facet arthropathy L5-S1 2014: MRI L4-5 DDD with progression of L5-S1 changes 2014: Pain  eval, limited  Formatting of this note might be different from the original. 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 2008: -0.7 2016: -1.6 2019: -1.5  Formatting of this note might be different from the original. 2008: -0.7 2016: -1.6 2019: -1.5 2021: -1.6   Small cell B-cell lymphoma 12/01/2020   Formatting of this note might be different from the original. 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   metoprolol tartrate (LOPRESSOR) 50 MG tablet Take 50 mg by mouth every 12 (twelve) hours.   omeprazole (PRILOSEC OTC) 20 MG tablet Take 20 mg by mouth as needed (heartburn or indigestion).   pregabalin (LYRICA) 75 MG capsule Take 75 mg by mouth as needed (pain).   rosuvastatin (CRESTOR) 40 MG tablet Take 40 mg by mouth daily.   traMADol (ULTRAM) 50 MG tablet Take 1 tablet (50 mg total) by mouth every 6 (six) hours as needed.   zolpidem (AMBIEN) 10 MG tablet TAKE 1/2 TABLET(5 MG) BY MOUTH EVERY NIGHT AS NEEDED FOR SLEEP     Allergies:   Lisinopril   Social History   Socioeconomic History   Marital status: Married    Spouse name: Not on file   Number of children: 2   Years of education: Not on file   Highest education level: Not on file  Occupational History   Occupation: retired Agricultural consultantteaching  assistant  Tobacco Use   Smoking status: Former    Types: Cigarettes    Start date: 1966    Quit date: 03/01/1978    Years since quitting: 44.3   Smokeless tobacco: Never  Substance and Sexual Activity   Alcohol use: Yes    Alcohol/week: 14.0 standard drinks of alcohol    Types: 14 Glasses of wine per week    Comment: with diinner daily for 50 years   Drug use: Never   Sexual activity: Not on file  Other Topics Concern   Not on file  Social History Narrative   Not on file   Social Determinants of Health   Financial Resource Strain: Not on file  Food Insecurity: Not on file  Transportation Needs: Not on file  Physical Activity: Not on file  Stress: Not on file  Social Connections: Not on file     Family History: The patient's family history includes Breast cancer in her maternal grandmother, mother, and paternal grandmother; Heart attack in her paternal grandfather and paternal grandmother; Heart disease in her father.  ROS:   Please see the history of present illness.    All other systems reviewed and are  negative.  EKGs/Labs/Other Studies Reviewed:    The following studies were reviewed today: EKG revealed sinus rhythm with nonspecific ST changes   Recent Labs: 07/23/2021: TSH 1.300 06/04/2022: ALT 14; BUN 18; Creatinine 0.94; Hemoglobin 9.4; Platelet Count 223; Potassium 4.0; Sodium 141  Recent Lipid Panel No results found for: "CHOL", "TRIG", "HDL", "CHOLHDL", "VLDL", "LDLCALC", "LDLDIRECT"  Physical Exam:    VS:  BP (!) 164/60   Pulse (!) 56   Ht 5\' 5"  (1.651 m)   Wt 167 lb 12.8 oz (76.1 kg)   SpO2 97%   BMI 27.92 kg/m     Wt Readings from Last 3 Encounters:  06/09/22 167 lb 12.8 oz (76.1 kg)  02/09/22 170 lb 1.6 oz (77.2 kg)  10/13/21 159 lb (72.1 kg)     GEN: Patient is in no acute distress HEENT: Normal NECK: No JVD; No carotid bruits LYMPHATICS: No lymphadenopathy CARDIAC: Hear sounds regular, 2/6 systolic murmur at the apex. RESPIRATORY:  Clear to auscultation without rales, wheezing or rhonchi  ABDOMEN: Soft, non-tender, non-distended MUSCULOSKELETAL:  No edema; No deformity  SKIN: Warm and dry NEUROLOGIC:  Alert and oriented x 3 PSYCHIATRIC:  Normal affect   Signed, Garwin Brothers, MD  06/09/2022 10:44 AM    [facility]

## 2022-06-10 ENCOUNTER — Other Ambulatory Visit: Payer: Self-pay | Admitting: Oncology

## 2022-06-10 DIAGNOSIS — D649 Anemia, unspecified: Secondary | ICD-10-CM

## 2022-06-10 NOTE — Progress Notes (Signed)
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  She comes in today for routine follow-up, as well as to review her most recent CT scans.  Since her last visit, the patient has been doing fairly well.  She denies having any B symptoms or lymphadenopathy which concern her for recurrent lymphoma.    PHYSICAL EXAM:  Blood pressure (!) 217/87, pulse (!) 59, temperature 98.8 F (37.1 C), resp. rate 16, height 5\' 5"  (1.651 m), weight 167 lb 9.6 oz (76 kg), SpO2 98 %. Wt Readings from Last 3 Encounters:  06/11/22 167 lb 9.6 oz (76 kg)  06/09/22 167 lb 12.8 oz (76.1 kg)  02/09/22 170 lb 1.6 oz (77.2 kg)   Body mass index is 27.89 kg/m. Performance status (ECOG): 1 - Symptomatic but completely ambulatory Physical Exam Constitutional:      Appearance: Normal appearance. She is not ill-appearing.  HENT:     Mouth/Throat:     Mouth: Mucous membranes are moist.     Pharynx: Oropharynx is clear. No oropharyngeal exudate or posterior oropharyngeal erythema.  Cardiovascular:     Rate and Rhythm: Normal rate and regular rhythm.     Heart sounds: No murmur heard.    No friction rub. No gallop.  Pulmonary:     Effort: Pulmonary effort is normal. No respiratory distress.     Breath sounds: Normal breath sounds. No wheezing, rhonchi or rales.  Abdominal:     General: Bowel sounds are normal. There is no distension.     Palpations: Abdomen is soft. There is no mass.     Tenderness: There is no abdominal tenderness.  Musculoskeletal:        General: No swelling.     Right lower leg: No edema.     Left lower leg: No edema.  Lymphadenopathy:     Cervical: No cervical adenopathy.     Upper Body:     Right upper body: No  supraclavicular or axillary adenopathy.     Left upper body: No supraclavicular or axillary adenopathy.     Lower Body: No right inguinal adenopathy. No left inguinal adenopathy.  Skin:    General: Skin is warm.     Coloration: Skin is not jaundiced.     Findings: No lesion or rash.  Neurological:     General: No focal deficit present.     Mental Status: She is alert and oriented to person, place, and time. Mental status is at baseline.  Psychiatric:        Mood and Affect: Mood normal.        Behavior: Behavior normal.        Thought Content: Thought content normal.   SCANS:  CT scans of her chest/abdomen/pelvis revealed the following: FINDINGS: CT CHEST FINDINGS  Cardiovascular: Heart size is normal. No pericardial effusion. Aortic atherosclerosis. Coronary artery calcifications.  Mediastinum/Nodes: 1.7 cm lower pole thyroid nodule is identified, image 129/601. The trachea and esophagus are unremarkable. No enlarged axillary, supraclavicular, mediastinal or hilar lymph nodes.  Lungs/Pleura: No pleural effusion or airspace consolidation. Index nodule within the right lower lobe measures 5 mm, image 81/301. This is unchanged compared with the previous exam. No new lung nodules identified.  Musculoskeletal: No chest  wall mass or suspicious bone lesions identified.  CT ABDOMEN PELVIS FINDINGS  Hepatobiliary: No suspicious liver abnormality. The small low-density lesions are unchanged, likely cysts. There is signs of prior cholecystectomy. No significant bile duct dilatation.  Pancreas: Unremarkable. No pancreatic ductal dilatation or surrounding inflammatory changes.  Spleen: Interval improvement in previous mild splenomegaly. No focal splenic abnormality.  Adrenals/Urinary Tract: Adrenal glands are unremarkable. Kidneys are normal, without renal calculi, focal lesion, or hydronephrosis. Bladder is unremarkable.  Stomach/Bowel: Stomach is within normal limits.  Appendix appears normal. No evidence of bowel wall thickening, distention, or inflammatory changes. Left-sided colonic diverticulosis. No signs of acute diverticulitis.  Vascular/Lymphatic: Aortic atherosclerosis. No enlarged abdominal or pelvic lymph nodes.  Reproductive: Status post hysterectomy. No adnexal masses.  Other: No ascites or focal fluid collections.  Musculoskeletal: Scoliosis and degenerative disc disease. No acute or suspicious osseous findings.  IMPRESSION: 1. No acute findings within the chest, abdomen or pelvis. No signs of residual/recurrent lymphoma. 2. Stable 5 mm right lower lobe pulmonary nodule. 3. Interval improvement in previous mild splenomegaly. No focal splenic lesion. 4. 1.7 cm lower pole thyroid nodule. Recommend thyroid US (ref: J Am Coll Radiol. 2015 Feb;12(2): 143-50). 5. Aortic Atherosclerosis (ICD10-I70.0).  LABS:    Latest Reference Range & Units 06/11/22 09:24  Iron 28 - 170 ug/dL 74  UIBC ug/dL 161  TIBC 096 - 045 ug/dL 409  Saturation Ratios 10.4 - 31.8 % 21  Ferritin 11 - 307 ng/mL 132  Transferrin Receptor 12.2 - 27.3 nmol/L 44.6 (H)  (H): Data is abnormally high   Latest Reference Range & Units 06/04/22 09:15  Sodium 135 - 145 mmol/L 141  Potassium 3.5 - 5.1 mmol/L 4.0  Chloride 98 - 111 mmol/L 111  CO2 22 - 32 mmol/L 25  Glucose 70 - 99 mg/dL 811 (H)  BUN 8 - 23 mg/dL 18  Creatinine 9.14 - 7.82 mg/dL 9.56  Calcium 8.9 - 21.3 mg/dL 8.8 (L)  Anion gap 5 - 15  5  Alkaline Phosphatase 38 - 126 U/L 60  Albumin 3.5 - 5.0 g/dL 3.7  AST 15 - 41 U/L 19  ALT 0 - 44 U/L 14  Total Protein 6.5 - 8.1 g/dL 6.6  Total Bilirubin 0.3 - 1.2 mg/dL 0.7  GFR, Est Non African American >60 mL/min >60  (H): Data is abnormally high (L): Data is abnormally low   Latest Reference Range & Units 02/03/22 10:17 06/04/22 09:15  M Protein SerPl Elph-Mcnc Not Observed g/dL 0.4 (H) (C) 0.3 (H) (C)  (H): Data is abnormally high (C): Corrected Her  initial M protein level was 2.5 g   Latest Reference Range & Units 02/03/22 10:17 06/04/22 09:15  IgG (Immunoglobin G), Serum 586 - 1,602 mg/dL 086 5,784   Her initial IgG level was 3,255.   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  In clinic today, I went over her CT scans images with her, which show no radiographic evidence of disease progression.  Understandably, the patient was pleased with her scans. I am pleased as her monoclonal parameters studies remain low.  Although she has [name], the level of her monoclonal parameters have historically shown  how her disease is behaving.  From a lymphoma standpoint, the patient is doing well.  I will see her back in 4 months for repeat clinical assessment.  The patient understands all the plans discussed today and is in agreement with them.    Milanya Sunderland Kirby FunkA Caswell Alvillar, MD

## 2022-06-11 ENCOUNTER — Other Ambulatory Visit: Payer: Self-pay

## 2022-06-11 ENCOUNTER — Telehealth: Payer: Self-pay | Admitting: Oncology

## 2022-06-11 ENCOUNTER — Other Ambulatory Visit: Payer: Self-pay | Admitting: Oncology

## 2022-06-11 ENCOUNTER — Inpatient Hospital Stay: Payer: Medicare PPO

## 2022-06-11 ENCOUNTER — Inpatient Hospital Stay: Payer: Medicare PPO | Admitting: Oncology

## 2022-06-11 VITALS — BP 217/87 | HR 59 | Temp 98.8°F | Resp 16 | Ht 65.0 in | Wt 167.6 lb

## 2022-06-11 DIAGNOSIS — D508 Other iron deficiency anemias: Secondary | ICD-10-CM

## 2022-06-11 DIAGNOSIS — C8208 Follicular lymphoma grade I, lymph nodes of multiple sites: Secondary | ICD-10-CM

## 2022-06-11 DIAGNOSIS — D649 Anemia, unspecified: Secondary | ICD-10-CM

## 2022-06-11 DIAGNOSIS — C8300 Small cell B-cell lymphoma, unspecified site: Secondary | ICD-10-CM | POA: Diagnosis not present

## 2022-06-11 LAB — CBC AND DIFFERENTIAL
HCT: 32 — AB (ref 36–46)
Hemoglobin: 10.6 — AB (ref 12.0–16.0)
Neutrophils Absolute: 3.05
Platelets: 182 10*3/uL (ref 150–400)
WBC: 4.3

## 2022-06-11 LAB — IRON AND TIBC
Iron: 74 ug/dL (ref 28–170)
Saturation Ratios: 21 % (ref 10.4–31.8)
TIBC: 347 ug/dL (ref 250–450)
UIBC: 273 ug/dL

## 2022-06-11 LAB — FERRITIN: Ferritin: 132 ng/mL (ref 11–307)

## 2022-06-11 LAB — FOLATE: Folate: 10 ng/mL (ref >5.9)

## 2022-06-11 LAB — CBC: RBC: 3.6 — AB (ref 3.87–5.11)

## 2022-06-11 NOTE — Telephone Encounter (Signed)
Patient has been scheduled for follow-up visit per 06/11/22 LOS.  Pt given an appt calendar with date and time.

## 2022-06-12 LAB — SOLUBLE TRANSFERRIN RECEPTOR: Transferrin Receptor: 44.6 nmol/L — ABNORMAL HIGH (ref 12.2–27.3)

## 2022-06-13 ENCOUNTER — Encounter: Payer: Self-pay | Admitting: Hematology and Oncology

## 2022-06-14 ENCOUNTER — Encounter: Payer: Self-pay | Admitting: Oncology

## 2022-06-25 ENCOUNTER — Encounter: Payer: Self-pay | Admitting: Oncology

## 2022-07-06 ENCOUNTER — Other Ambulatory Visit: Payer: Self-pay | Admitting: Cardiology

## 2022-10-04 ENCOUNTER — Ambulatory Visit: Payer: Medicare PPO

## 2022-10-04 ENCOUNTER — Inpatient Hospital Stay: Payer: Medicare PPO | Attending: Oncology

## 2022-10-04 DIAGNOSIS — C8300 Small cell B-cell lymphoma, unspecified site: Secondary | ICD-10-CM | POA: Diagnosis present

## 2022-10-04 DIAGNOSIS — D508 Other iron deficiency anemias: Secondary | ICD-10-CM

## 2022-10-04 DIAGNOSIS — Z9221 Personal history of antineoplastic chemotherapy: Secondary | ICD-10-CM | POA: Insufficient documentation

## 2022-10-04 LAB — CBC WITH DIFFERENTIAL (CANCER CENTER ONLY)
Abs Immature Granulocytes: 0.01 10*3/uL (ref 0.00–0.07)
Basophils Absolute: 0 10*3/uL (ref 0.0–0.1)
Basophils Relative: 1 %
Eosinophils Absolute: 0.1 10*3/uL (ref 0.0–0.5)
Eosinophils Relative: 3 %
HCT: 38.6 % (ref 36.0–46.0)
Hemoglobin: 12 g/dL (ref 12.0–15.0)
Immature Granulocytes: 0 %
Lymphocytes Relative: 22 %
Lymphs Abs: 1 10*3/uL (ref 0.7–4.0)
MCH: 29.5 pg (ref 26.0–34.0)
MCHC: 31.1 g/dL (ref 30.0–36.0)
MCV: 94.8 fL (ref 80.0–100.0)
Monocytes Absolute: 0.4 10*3/uL (ref 0.1–1.0)
Monocytes Relative: 9 %
Neutro Abs: 2.8 10*3/uL (ref 1.7–7.7)
Neutrophils Relative %: 65 %
Platelet Count: 167 10*3/uL (ref 150–400)
RBC: 4.07 MIL/uL (ref 3.87–5.11)
RDW: 14.3 % (ref 11.5–15.5)
WBC Count: 4.3 10*3/uL (ref 4.0–10.5)
nRBC: 0 % (ref 0.0–0.2)

## 2022-10-04 LAB — IRON AND TIBC
Iron: 57 ug/dL (ref 28–170)
Saturation Ratios: 17 % (ref 10.4–31.8)
TIBC: 344 ug/dL (ref 250–450)
UIBC: 287 ug/dL

## 2022-10-04 LAB — FERRITIN: Ferritin: 48 ng/mL (ref 11–307)

## 2022-10-04 LAB — VITAMIN B12: Vitamin B-12: 280 pg/mL (ref 180–914)

## 2022-10-10 NOTE — Progress Notes (Unsigned)
[facility]  [address] 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  She comes in today for routine follow-up.  Since her last visit, the patient has been doing fairly well.  She denies having any B symptoms or lymphadenopathy which concerns her for recurrent lymphoma.    PHYSICAL EXAM:  Blood pressure (!) 203/86, pulse 69, temperature 99.2 F (37.3 C), resp. rate 16, height 5\' 5"  (1.651 m), weight 174 lb 6.4 oz (79.1 kg), SpO2 98%. Wt Readings from Last 3 Encounters:  10/11/22 174 lb 6.4 oz (79.1 kg)  06/11/22 167 lb 9.6 oz (76 kg)  06/09/22 167 lb 12.8 oz (76.1 kg)   Body mass index is 29.02 kg/m. Performance status (ECOG): 1 - Symptomatic but completely ambulatory Physical Exam Constitutional:      Appearance: Normal appearance. She is not ill-appearing.  HENT:     Mouth/Throat:     Mouth: Mucous membranes are moist.     Pharynx: Oropharynx is clear. No oropharyngeal exudate or posterior oropharyngeal erythema.  Cardiovascular:     Rate and Rhythm: Normal rate and regular rhythm.     Heart sounds: No murmur heard.    No friction rub. No gallop.  Pulmonary:     Effort: Pulmonary effort is normal. No respiratory distress.     Breath sounds: Normal breath sounds. No wheezing, rhonchi or rales.  Abdominal:     General: Bowel sounds are normal. There is no distension.     Palpations: Abdomen is soft. There is no mass.     Tenderness: There is no abdominal tenderness.  Musculoskeletal:        General: No swelling.     Right lower leg: No edema.     Left lower leg: No edema.  Lymphadenopathy:     Cervical: No cervical adenopathy.     Upper Body:     Right upper body: No supraclavicular or axillary adenopathy.     Left  upper body: No supraclavicular or axillary adenopathy.     Lower Body: No right inguinal adenopathy. No left inguinal adenopathy.  Skin:    General: Skin is warm.     Coloration: Skin is not jaundiced.     Findings: No lesion or rash.  Neurological:     General: No focal deficit present.     Mental Status: She is alert and oriented to person, place, and time. Mental status is at baseline.  Psychiatric:        Mood and Affect: Mood normal.        Behavior: Behavior normal.        Thought Content: Thought content normal.    LABS:  Latest Reference Range & Units 10/04/22 10:03  WBC 4.0 - 10.5 K/uL 4.3  RBC 3.87 - 5.11 MIL/uL 4.07  Hemoglobin 12.0 - 15.0 g/dL 91.4  HCT 78.2 - 95.6 % 38.6  MCV 80.0 - 100.0 fL 94.8  MCH 26.0 - 34.0 pg 29.5  MCHC 30.0 - 36.0 g/dL 21.3  RDW 08.6 - 57.8 % 14.3  Platelets 150 - 400 K/uL 167  nRBC 0.0 - 0.2 % 0.0  Neutrophils % 65  Lymphocytes % 22  Monocytes Relative % 9  Eosinophil % 3  Basophil % 1  Latest Reference Range & Units 06/11/22 10:31 10/04/22 10:03  Iron 28 - 170 ug/dL  57  UIBC ug/dL  295  TIBC 284 - 132 ug/dL  440  Saturation Ratios 10.4 - 31.8 %  17  Ferritin 11 - 307 ng/mL  48  Folate >5.9 ng/mL 10.0   Transferrin Receptor 12.2 - 27.3 nmol/L  32.6 (H)  (H): Data is abnormally high   Latest Reference Range & Units 06/04/22 09:15 10/04/22 10:03  M Protein SerPl Elph-Mcnc Not Observed g/dL 0.3 (H) (C) 0.4 (H) (C)  (H): Data is abnormally high (C): Corrected  Her initial M protein level was 2.5 g   Latest Reference Range & Units 06/04/22 09:15 10/04/22 10:03  IgG (Immunoglobin G), Serum 586 - 1,602 mg/dL 1,027 253    Her initial IgG level was 3,255.   Latest Reference Range & Units 06/04/22 09:15 10/04/22 10:03  Kappa free light chain 3.3 - 19.4 mg/L 109.8 (H) 94.0 (H)  (H): Data is abnormally high Her initial kappa light chain level was 1430.5  ASSESSMENT & PLAN:  Assessment/Plan:  A 77 y.o. female with a small  B-cell lymphoma, who completed 6 cycles of Bendamustine/Rituxan in February 2023.  In clinic today, I went over her recent labs with her, which continue to show no evidence of her B-cell lymphoma returning.  Clinically, the patient is doing very well.  At that is the case, I will see her back in 4 months for repeat clinical assessment. The patient understands all the plans discussed today and is in agreement with them.     Kirby Funk, MD

## 2022-10-11 ENCOUNTER — Other Ambulatory Visit: Payer: Self-pay | Admitting: Oncology

## 2022-10-11 ENCOUNTER — Inpatient Hospital Stay: Payer: Medicare PPO | Admitting: Oncology

## 2022-10-11 VITALS — BP 203/86 | HR 69 | Temp 99.2°F | Resp 16 | Ht 65.0 in | Wt 174.4 lb

## 2022-10-11 DIAGNOSIS — C8300 Small cell B-cell lymphoma, unspecified site: Secondary | ICD-10-CM

## 2022-10-11 DIAGNOSIS — C8308 Small cell B-cell lymphoma, lymph nodes of multiple sites: Secondary | ICD-10-CM

## 2023-02-03 ENCOUNTER — Inpatient Hospital Stay: Payer: Medicare PPO | Attending: Oncology

## 2023-02-03 DIAGNOSIS — C8300 Small cell B-cell lymphoma, unspecified site: Secondary | ICD-10-CM | POA: Insufficient documentation

## 2023-02-03 LAB — CBC WITH DIFFERENTIAL (CANCER CENTER ONLY)
Abs Immature Granulocytes: 0.01 10*3/uL (ref 0.00–0.07)
Basophils Absolute: 0.1 10*3/uL (ref 0.0–0.1)
Basophils Relative: 1 %
Eosinophils Absolute: 0.1 10*3/uL (ref 0.0–0.5)
Eosinophils Relative: 3 %
HCT: 36.2 % (ref 36.0–46.0)
Hemoglobin: 11.7 g/dL — ABNORMAL LOW (ref 12.0–15.0)
Immature Granulocytes: 0 %
Lymphocytes Relative: 22 %
Lymphs Abs: 0.8 10*3/uL (ref 0.7–4.0)
MCH: 28.5 pg (ref 26.0–34.0)
MCHC: 32.3 g/dL (ref 30.0–36.0)
MCV: 88.3 fL (ref 80.0–100.0)
Monocytes Absolute: 0.3 10*3/uL (ref 0.1–1.0)
Monocytes Relative: 9 %
Neutro Abs: 2.5 10*3/uL (ref 1.7–7.7)
Neutrophils Relative %: 65 %
Platelet Count: 169 10*3/uL (ref 150–400)
RBC: 4.1 MIL/uL (ref 3.87–5.11)
RDW: 15 % (ref 11.5–15.5)
WBC Count: 3.8 10*3/uL — ABNORMAL LOW (ref 4.0–10.5)
nRBC: 0 % (ref 0.0–0.2)
nRBC: 0 /100{WBCs}

## 2023-02-03 LAB — CMP (CANCER CENTER ONLY)
ALT: 12 U/L (ref 0–44)
AST: 23 U/L (ref 15–41)
Albumin: 4.3 g/dL (ref 3.5–5.0)
Alkaline Phosphatase: 81 U/L (ref 38–126)
Anion gap: 11 (ref 5–15)
BUN: 18 mg/dL (ref 8–23)
CO2: 26 mmol/L (ref 22–32)
Calcium: 9.8 mg/dL (ref 8.9–10.3)
Chloride: 105 mmol/L (ref 98–111)
Creatinine: 1.04 mg/dL — ABNORMAL HIGH (ref 0.44–1.00)
GFR, Estimated: 56 mL/min — ABNORMAL LOW (ref >60)
Glucose, Bld: 124 mg/dL — ABNORMAL HIGH (ref 70–99)
Potassium: 4 mmol/L (ref 3.5–5.1)
Sodium: 142 mmol/L (ref 135–145)
Total Bilirubin: 0.8 mg/dL (ref <1.2)
Total Protein: 6.7 g/dL (ref 6.5–8.1)

## 2023-02-04 LAB — KAPPA/LAMBDA LIGHT CHAINS
Kappa free light chain: 67.6 mg/L — ABNORMAL HIGH (ref 3.3–19.4)
Kappa, lambda light chain ratio: 4.51 — ABNORMAL HIGH (ref 0.26–1.65)
Lambda free light chains: 15 mg/L (ref 5.7–26.3)

## 2023-02-09 LAB — MULTIPLE MYELOMA PANEL, SERUM
Albumin SerPl Elph-Mcnc: 3.9 g/dL (ref 2.9–4.4)
Albumin/Glob SerPl: 1.6 (ref 0.7–1.7)
Alpha 1: 0.3 g/dL (ref 0.0–0.4)
Alpha2 Glob SerPl Elph-Mcnc: 0.6 g/dL (ref 0.4–1.0)
B-Globulin SerPl Elph-Mcnc: 0.8 g/dL (ref 0.7–1.3)
Gamma Glob SerPl Elph-Mcnc: 0.8 g/dL (ref 0.4–1.8)
Globulin, Total: 2.5 g/dL (ref 2.2–3.9)
IgA: 37 mg/dL — ABNORMAL LOW (ref 64–422)
IgG (Immunoglobin G), Serum: 824 mg/dL (ref 586–1602)
IgM (Immunoglobulin M), Srm: 32 mg/dL (ref 26–217)
M Protein SerPl Elph-Mcnc: 0.4 g/dL — ABNORMAL HIGH
Total Protein ELP: 6.4 g/dL (ref 6.0–8.5)

## 2023-02-09 NOTE — Progress Notes (Unsigned)
[facility]  [address] 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  She comes in today for routine follow-up.  Since her last visit, the patient has been doing fairly well.  She denies having any B symptoms or lymphadenopathy which concerns her for recurrent lymphoma.    PHYSICAL EXAM:  There were no vitals taken for this visit. Wt Readings from Last 3 Encounters:  10/11/22 174 lb 6.4 oz (79.1 kg)  06/11/22 167 lb 9.6 oz (76 kg)  06/09/22 167 lb 12.8 oz (76.1 kg)   There is no height or weight on file to calculate BMI. Performance status (ECOG): 1 - Symptomatic but completely ambulatory Physical Exam Constitutional:      Appearance: Normal appearance. She is not ill-appearing.  HENT:     Mouth/Throat:     Mouth: Mucous membranes are moist.     Pharynx: Oropharynx is clear. No oropharyngeal exudate or posterior oropharyngeal erythema.  Cardiovascular:     Rate and Rhythm: Normal rate and regular rhythm.     Heart sounds: No murmur heard.    No friction rub. No gallop.  Pulmonary:     Effort: Pulmonary effort is normal. No respiratory distress.     Breath sounds: Normal breath sounds. No wheezing, rhonchi or rales.  Abdominal:     General: Bowel sounds are normal. There is no distension.     Palpations: Abdomen is soft. There is no mass.     Tenderness: There is no abdominal tenderness.  Musculoskeletal:        General: No swelling.     Right lower leg: No edema.     Left lower leg: No edema.  Lymphadenopathy:     Cervical: No cervical adenopathy.     Upper Body:     Right upper body: No supraclavicular or axillary adenopathy.     Left upper body: No supraclavicular or axillary adenopathy.     Lower Body: No right  inguinal adenopathy. No left inguinal adenopathy.  Skin:    General: Skin is warm.     Coloration: Skin is not jaundiced.     Findings: No lesion or rash.  Neurological:     General: No focal deficit present.     Mental Status: She is alert and oriented to person, place, and time. Mental status is at baseline.  Psychiatric:        Mood and Affect: Mood normal.        Behavior: Behavior normal.        Thought Content: Thought content normal.    LABS:  Latest Reference Range & Units 02/03/23 09:56  WBC 4.0 - 10.5 K/uL 3.8 (L)  RBC 3.87 - 5.11 MIL/uL 4.10  Hemoglobin 12.0 - 15.0 g/dL 95.6 (L)  HCT 38.7 - 56.4 % 36.2  MCV 80.0 - 100.0 fL 88.3  MCH 26.0 - 34.0 pg 28.5  MCHC 30.0 - 36.0 g/dL 33.2  RDW 95.1 - 88.4 % 15.0  Platelets 150 - 400 K/uL 169  nRBC 0.0 - 0.2 % 0 /100 WBC 0.00 0  Neutrophils % 65  Lymphocytes % 22  Monocytes Relative % 9  Eosinophil % 3  Basophil % 1  Immature Granulocytes % 0  (L): Data is abnormally low  Latest Reference Range & Units 06/04/22 09:15 10/04/22 10:03  M Protein SerPl Elph-Mcnc Not Observed g/dL 0.3 (H) (C) 0.4 (H) (C)  (H): Data is abnormally high (C): Corrected  Her initial M protein level was 2.5 g   Latest Reference Range & Units 06/04/22 09:15 10/04/22 10:03  IgG (Immunoglobin G), Serum 586 - 1,602 mg/dL 1,914 782    Her initial IgG level was 3,255.   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  In clinic today, I went over her recent labs with her, which continue to show no evidence of her B-cell lymphoma returning.  Clinically, the patient is doing very well.  At that is the case, I will see her back in 4 months for repeat  clinical assessment. The patient understands all the plans discussed today and is in agreement with them.    Alisha Cochran Alisha Funk, MD

## 2023-02-10 ENCOUNTER — Other Ambulatory Visit: Payer: Self-pay | Admitting: Oncology

## 2023-02-10 ENCOUNTER — Inpatient Hospital Stay: Payer: Medicare PPO | Admitting: Oncology

## 2023-02-10 VITALS — BP 155/56 | HR 54 | Temp 99.2°F | Resp 14 | Ht 65.0 in | Wt 168.4 lb

## 2023-02-10 DIAGNOSIS — C8300 Small cell B-cell lymphoma, unspecified site: Secondary | ICD-10-CM | POA: Diagnosis not present

## 2023-03-25 ENCOUNTER — Other Ambulatory Visit: Payer: Self-pay | Admitting: Physician Assistant

## 2023-03-25 DIAGNOSIS — M79669 Pain in unspecified lower leg: Secondary | ICD-10-CM

## 2023-03-31 NOTE — Discharge Instructions (Signed)

## 2023-04-01 ENCOUNTER — Inpatient Hospital Stay
Admission: RE | Admit: 2023-04-01 | Discharge: 2023-04-01 | Disposition: A | Discharge status: Home / Self Care | Payer: Medicare PPO | Source: Ambulatory Visit | Attending: Physician Assistant | Admitting: Physician Assistant

## 2023-06-03 ENCOUNTER — Inpatient Hospital Stay: Payer: Medicare PPO | Attending: Oncology

## 2023-06-03 DIAGNOSIS — C8300 Small cell B-cell lymphoma, unspecified site: Secondary | ICD-10-CM | POA: Diagnosis present

## 2023-06-03 LAB — CBC WITH DIFFERENTIAL (CANCER CENTER ONLY)
Abs Immature Granulocytes: 0.01 10*3/uL (ref 0.00–0.07)
Basophils Absolute: 0 10*3/uL (ref 0.0–0.1)
Basophils Relative: 1 %
Eosinophils Absolute: 0.1 10*3/uL (ref 0.0–0.5)
Eosinophils Relative: 2 %
HCT: 37.4 % (ref 36.0–46.0)
Hemoglobin: 12.4 g/dL (ref 12.0–15.0)
Immature Granulocytes: 0 %
Lymphocytes Relative: 21 %
Lymphs Abs: 1 10*3/uL (ref 0.7–4.0)
MCH: 29.7 pg (ref 26.0–34.0)
MCHC: 33.2 g/dL (ref 30.0–36.0)
MCV: 89.7 fL (ref 80.0–100.0)
Monocytes Absolute: 0.5 10*3/uL (ref 0.1–1.0)
Monocytes Relative: 10 %
Neutro Abs: 3 10*3/uL (ref 1.7–7.7)
Neutrophils Relative %: 66 %
Platelet Count: 184 10*3/uL (ref 150–400)
RBC: 4.17 MIL/uL (ref 3.87–5.11)
RDW: 14.2 % (ref 11.5–15.5)
WBC Count: 4.6 10*3/uL (ref 4.0–10.5)
nRBC: 0 % (ref 0.0–0.2)
nRBC: 0 /100{WBCs}

## 2023-06-03 LAB — CMP (CANCER CENTER ONLY)
ALT: 12 U/L (ref 0–44)
AST: 21 U/L (ref 15–41)
Albumin: 4.6 g/dL (ref 3.5–5.0)
Alkaline Phosphatase: 82 U/L (ref 38–126)
Anion gap: 11 (ref 5–15)
BUN: 17 mg/dL (ref 8–23)
CO2: 26 mmol/L (ref 22–32)
Calcium: 10 mg/dL (ref 8.9–10.3)
Chloride: 103 mmol/L (ref 98–111)
Creatinine: 1.15 mg/dL — ABNORMAL HIGH (ref 0.44–1.00)
GFR, Estimated: 49 mL/min — ABNORMAL LOW (ref >60)
Glucose, Bld: 125 mg/dL — ABNORMAL HIGH (ref 70–99)
Potassium: 4 mmol/L (ref 3.5–5.1)
Sodium: 139 mmol/L (ref 135–145)
Total Bilirubin: 1.1 mg/dL (ref 0.0–1.2)
Total Protein: 6.7 g/dL (ref 6.5–8.1)

## 2023-06-06 LAB — KAPPA/LAMBDA LIGHT CHAINS
Kappa free light chain: 49.1 mg/L — ABNORMAL HIGH (ref 3.3–19.4)
Kappa, lambda light chain ratio: 4.31 — ABNORMAL HIGH (ref 0.26–1.65)
Lambda free light chains: 11.4 mg/L (ref 5.7–26.3)

## 2023-06-07 LAB — MULTIPLE MYELOMA PANEL, SERUM
Albumin SerPl Elph-Mcnc: 3.9 g/dL (ref 2.9–4.4)
Albumin/Glob SerPl: 1.7 (ref 0.7–1.7)
Alpha 1: 0.3 g/dL (ref 0.0–0.4)
Alpha2 Glob SerPl Elph-Mcnc: 0.6 g/dL (ref 0.4–1.0)
B-Globulin SerPl Elph-Mcnc: 0.9 g/dL (ref 0.7–1.3)
Gamma Glob SerPl Elph-Mcnc: 0.6 g/dL (ref 0.4–1.8)
Globulin, Total: 2.4 g/dL (ref 2.2–3.9)
IgA: 33 mg/dL — ABNORMAL LOW (ref 64–422)
IgG (Immunoglobin G), Serum: 684 mg/dL (ref 586–1602)
IgM (Immunoglobulin M), Srm: 28 mg/dL (ref 26–217)
M Protein SerPl Elph-Mcnc: 0.3 g/dL — ABNORMAL HIGH
Total Protein ELP: 6.3 g/dL (ref 6.0–8.5)

## 2023-06-09 NOTE — Progress Notes (Unsigned)
 [facility]  [address] [place],  [place]  [address] [phone]  Clinic Day:  06/10/2023  Referring physician: Olive Bass, MD  HISTORY OF PRESENT ILLNESS:  The patient is a 78 y.o. 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  She comes in today for routine follow-up.  Since her last visit, the patient has been doing fairly well.  She denies having any B symptoms or lymphadenopathy which concerns her for recurrent lymphoma.    PHYSICAL EXAM:  Blood pressure (!) 148/57, pulse (!) 52, temperature 98.4 F (36.9 C), temperature source Oral, resp. rate 16, height 5\' 5"  (1.651 m), weight 170 lb (77.1 kg), SpO2 100%. Wt Readings from Last 3 Encounters:  06/10/23 170 lb (77.1 kg)  02/10/23 168 lb 6.4 oz (76.4 kg)  10/11/22 174 lb 6.4 oz (79.1 kg)   Body mass index is 28.29 kg/m. Performance status (ECOG): 1 - Symptomatic but completely ambulatory Physical Exam Constitutional:      Appearance: Normal appearance. She is not ill-appearing.  HENT:     Mouth/Throat:     Mouth: Mucous membranes are moist.     Pharynx: Oropharynx is clear. No oropharyngeal exudate or posterior oropharyngeal erythema.  Cardiovascular:     Rate and Rhythm: Normal rate and regular rhythm.     Heart sounds: No murmur heard.    No friction rub. No gallop.  Pulmonary:     Effort: Pulmonary effort is normal. No respiratory distress.     Breath sounds: Normal breath sounds. No wheezing, rhonchi or rales.  Abdominal:     General: Bowel sounds are normal. There is no distension.     Palpations: Abdomen is soft. There is no mass.     Tenderness: There is no abdominal tenderness.  Musculoskeletal:        General: No swelling.     Right lower leg: No edema.     Left lower leg: No edema.  Lymphadenopathy:     Cervical: No cervical adenopathy.     Upper Body:     Right upper body: No supraclavicular or axillary  adenopathy.     Left upper body: No supraclavicular or axillary adenopathy.     Lower Body: No right inguinal adenopathy. No left inguinal adenopathy.  Skin:    General: Skin is warm.     Coloration: Skin is not jaundiced.     Findings: No lesion or rash.  Neurological:     General: No focal deficit present.     Mental Status: She is alert and oriented to person, place, and time. Mental status is at baseline.  Psychiatric:        Mood and Affect: Mood normal.        Behavior: Behavior normal.        Thought Content: Thought content normal.    LABS:  Latest Reference Range & Units 06/03/23 10:34  WBC 4.0 - 10.5 K/uL 4.6  RBC 3.87 - 5.11 MIL/uL 4.17  Hemoglobin 12.0 - 15.0 g/dL 91.4  HCT 78.2 - 95.6 % 37.4  MCV 80.0 - 100.0 fL 89.7  MCH 26.0 - 34.0 pg 29.7  MCHC 30.0 - 36.0 g/dL 21.3  RDW 08.6 - 57.8 % 14.2  Platelets 150 - 400 K/uL 184  nRBC 0.0 - 0.2 % 0 /100 WBC 0.00 0  Neutrophils % 66  Lymphocytes % 21  Monocytes Relative % 10  Eosinophil % 2  Basophil %  1  Immature Granulocytes % 0    Latest Reference Range & Units 10/04/22 10:03 02/03/23 09:56 06/03/23 10:34  M Protein SerPl Elph-Mcnc Not Observed g/dL 0.4 (H) (C) 0.4 (H) (C) 0.3 (H) (C)  (H): Data is abnormally high (C): Corrected Her initial M protein level was 2.5 g   Latest Reference Range & Units 10/04/22 10:03 02/03/23 09:56 06/03/23 10:34  IgG (Immunoglobin G), Serum 586 - 1,602 mg/dL 621 308 657  Her initial IgG level was 3,255.   Latest Reference Range & Units 10/04/22 10:03 02/03/23 09:56 06/03/23 10:34  Kappa free light chain 3.3 - 19.4 mg/L 94.0 (H) 67.6 (H) 49.1 (H)  (H): Data is abnormally high Her initial kappa light chain level was 1430.5  ASSESSMENT & PLAN:  Assessment/Plan:  A 78 y.o. 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  In clinic today, I went over her recent labs with her, which continue to show no evidence of her B-cell  lymphoma returning.  I am pleased as her monoclonal parameters continue to fall.  Overall, the patient is doing very well.  I do feel comfortable seeing her back in 6 months for repeat clinical assessment.  The patient understands all the plans discussed today and is in agreement with them.    Giuseppe Duchemin Kirby Funk, MD

## 2023-06-10 ENCOUNTER — Inpatient Hospital Stay: Payer: Medicare PPO | Admitting: Oncology

## 2023-06-10 ENCOUNTER — Other Ambulatory Visit: Payer: Self-pay | Admitting: Oncology

## 2023-06-10 VITALS — BP 148/57 | HR 52 | Temp 98.4°F | Resp 16 | Ht 65.0 in | Wt 170.0 lb

## 2023-06-10 DIAGNOSIS — C8300 Small cell B-cell lymphoma, unspecified site: Secondary | ICD-10-CM | POA: Diagnosis not present

## 2023-12-06 ENCOUNTER — Inpatient Hospital Stay: Attending: Oncology

## 2023-12-06 DIAGNOSIS — C8300 Small cell B-cell lymphoma, unspecified site: Secondary | ICD-10-CM | POA: Insufficient documentation

## 2023-12-06 LAB — CBC WITH DIFFERENTIAL (CANCER CENTER ONLY)
Abs Immature Granulocytes: 0.01 K/uL (ref 0.00–0.07)
Basophils Absolute: 0 K/uL (ref 0.0–0.1)
Basophils Relative: 1 %
Eosinophils Absolute: 0.2 K/uL (ref 0.0–0.5)
Eosinophils Relative: 3 %
HCT: 35.8 % — ABNORMAL LOW (ref 36.0–46.0)
Hemoglobin: 11.7 g/dL — ABNORMAL LOW (ref 12.0–15.0)
Immature Granulocytes: 0 %
Lymphocytes Relative: 20 %
Lymphs Abs: 0.9 K/uL (ref 0.7–4.0)
MCH: 28.9 pg (ref 26.0–34.0)
MCHC: 32.7 g/dL (ref 30.0–36.0)
MCV: 88.4 fL (ref 80.0–100.0)
Monocytes Absolute: 0.5 K/uL (ref 0.1–1.0)
Monocytes Relative: 10 %
Neutro Abs: 3.1 K/uL (ref 1.7–7.7)
Neutrophils Relative %: 66 %
Platelet Count: 165 K/uL (ref 150–400)
RBC: 4.05 MIL/uL (ref 3.87–5.11)
RDW: 14.2 % (ref 11.5–15.5)
WBC Count: 4.7 K/uL (ref 4.0–10.5)
nRBC: 0 % (ref 0.0–0.2)

## 2023-12-06 LAB — CMP (CANCER CENTER ONLY)
ALT: 11 U/L (ref 0–44)
AST: 19 U/L (ref 15–41)
Albumin: 4.4 g/dL (ref 3.5–5.0)
Alkaline Phosphatase: 83 U/L (ref 38–126)
Anion gap: 11 (ref 5–15)
BUN: 15 mg/dL (ref 8–23)
CO2: 24 mmol/L (ref 22–32)
Calcium: 9.5 mg/dL (ref 8.9–10.3)
Chloride: 106 mmol/L (ref 98–111)
Creatinine: 1.03 mg/dL — ABNORMAL HIGH (ref 0.44–1.00)
GFR, Estimated: 56 mL/min — ABNORMAL LOW (ref >60)
Glucose, Bld: 104 mg/dL — ABNORMAL HIGH (ref 70–99)
Potassium: 4 mmol/L (ref 3.5–5.1)
Sodium: 141 mmol/L (ref 135–145)
Total Bilirubin: 0.7 mg/dL (ref 0.0–1.2)
Total Protein: 6.6 g/dL (ref 6.5–8.1)

## 2023-12-06 LAB — LACTATE DEHYDROGENASE: LDH: 149 U/L (ref 98–192)

## 2023-12-07 LAB — KAPPA/LAMBDA LIGHT CHAINS
Kappa free light chain: 44.8 mg/L — ABNORMAL HIGH (ref 3.3–19.4)
Kappa, lambda light chain ratio: 3.45 — ABNORMAL HIGH (ref 0.26–1.65)
Lambda free light chains: 13 mg/L (ref 5.7–26.3)

## 2023-12-09 LAB — MULTIPLE MYELOMA PANEL, SERUM
Albumin SerPl Elph-Mcnc: 3.8 g/dL (ref 2.9–4.4)
Albumin/Glob SerPl: 1.6 (ref 0.7–1.7)
Alpha 1: 0.2 g/dL (ref 0.0–0.4)
Alpha2 Glob SerPl Elph-Mcnc: 0.7 g/dL (ref 0.4–1.0)
B-Globulin SerPl Elph-Mcnc: 0.8 g/dL (ref 0.7–1.3)
Gamma Glob SerPl Elph-Mcnc: 0.6 g/dL (ref 0.4–1.8)
Globulin, Total: 2.4 g/dL (ref 2.2–3.9)
IgA: 50 mg/dL — ABNORMAL LOW (ref 64–422)
IgG (Immunoglobin G), Serum: 709 mg/dL (ref 586–1602)
IgM (Immunoglobulin M), Srm: 34 mg/dL (ref 26–217)
M Protein SerPl Elph-Mcnc: 0.3 g/dL — ABNORMAL HIGH
Total Protein ELP: 6.2 g/dL (ref 6.0–8.5)

## 2023-12-12 NOTE — Progress Notes (Unsigned)
 [facility] [address] [place],  [place]  [address] [phone]  Clinic Day:  06/10/2023  Referring physician: Ofilia Lamar CROME, MD  HISTORY OF PRESENT ILLNESS:  The patient is a 78 y.o. 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  She comes in today for routine follow-up.  Since her last visit, the patient has been doing fairly well.  She denies having any B symptoms or lymphadenopathy which concerns her for recurrent lymphoma.    PHYSICAL EXAM:  There were no vitals taken for this visit. Wt Readings from Last 3 Encounters:  06/10/23 170 lb (77.1 kg)  02/10/23 168 lb 6.4 oz (76.4 kg)  10/11/22 174 lb 6.4 oz (79.1 kg)   There is no height or weight on file to calculate BMI. Performance status (ECOG): 1 - Symptomatic but completely ambulatory Physical Exam Constitutional:      Appearance: Normal appearance. She is not ill-appearing.  HENT:     Mouth/Throat:     Mouth: Mucous membranes are moist.     Pharynx: Oropharynx is clear. No oropharyngeal exudate or posterior oropharyngeal erythema.  Cardiovascular:     Rate and Rhythm: Normal rate and regular rhythm.     Heart sounds: No murmur heard.    No friction rub. No gallop.  Pulmonary:     Effort: Pulmonary effort is normal. No respiratory distress.     Breath sounds: Normal breath sounds. No wheezing, rhonchi or rales.  Abdominal:     General: Bowel sounds are normal. There is no distension.     Palpations: Abdomen is soft. There is no mass.     Tenderness: There is no abdominal tenderness.  Musculoskeletal:        General: No swelling.     Right lower leg: No edema.     Left lower leg: No edema.  Lymphadenopathy:     Cervical: No cervical adenopathy.     Upper Body:     Right upper body: No supraclavicular or axillary adenopathy.     Left upper body: No supraclavicular or axillary adenopathy.     Lower Body: No right inguinal  adenopathy. No left inguinal adenopathy.  Skin:    General: Skin is warm.     Coloration: Skin is not jaundiced.     Findings: No lesion or rash.  Neurological:     General: No focal deficit present.     Mental Status: She is alert and oriented to person, place, and time. Mental status is at baseline.  Psychiatric:        Mood and Affect: Mood normal.        Behavior: Behavior normal.        Thought Content: Thought content normal.    LABS:  Latest Reference Range & Units 06/03/23 10:34  WBC 4.0 - 10.5 K/uL 4.6  RBC 3.87 - 5.11 MIL/uL 4.17  Hemoglobin 12.0 - 15.0 g/dL 87.5  HCT 63.9 - 53.9 % 37.4  MCV 80.0 - 100.0 fL 89.7  MCH 26.0 - 34.0 pg 29.7  MCHC 30.0 - 36.0 g/dL 66.7  RDW 88.4 - 84.4 % 14.2  Platelets 150 - 400 K/uL 184  nRBC 0.0 - 0.2 % 0 /100 WBC 0.00 0  Neutrophils % 66  Lymphocytes % 21  Monocytes Relative % 10  Eosinophil % 2  Basophil % 1  Immature Granulocytes % 0    Latest Reference Range & Units 10/04/22 10:03 02/03/23  09:56 06/03/23 10:34 12/06/23 10:06  M Protein SerPl Elph-Mcnc Not Observed g/dL 0.4 (H) (C) 0.4 (H) (C) 0.3 (H) (C) 0.3 (H) (C)  (H): Data is abnormally high (C): Corrected Her initial M protein level was 2.5 g  Latest Reference Range & Units 10/04/22 10:03 02/03/23 09:56 06/03/23 10:34 12/06/23 10:06  IgG (Immunoglobin G), Serum 586 - 1,602 mg/dL 123 175 315 290   Her initial IgG level was 3,255.   Latest Reference Range & Units 10/04/22 10:03 02/03/23 09:56 06/03/23 10:34 12/06/23 10:06  Kappa free light chain 3.3 - 19.4 mg/L 94.0 (H) 67.6 (H) 49.1 (H) 44.8 (H)  (H): Data is abnormally high Her initial kappa light chain level was 1430.5  ASSESSMENT & PLAN:  Assessment/Plan:  A 78 y.o. 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  In clinic today, I went over her recent labs with her, which continue to show no evidence of her B-cell lymphoma returning.  I am pleased as her  monoclonal parameters continue to fall.  Overall, the patient is doing very well.  I do feel comfortable seeing her back in 6 months for repeat clinical assessment.  The patient understands all the plans discussed today and is in agreement with them.    Georga Stys DELENA Kerns, MD

## 2023-12-13 ENCOUNTER — Telehealth: Payer: Self-pay | Admitting: Oncology

## 2023-12-13 ENCOUNTER — Inpatient Hospital Stay: Admitting: Oncology

## 2023-12-13 ENCOUNTER — Other Ambulatory Visit: Payer: Self-pay | Admitting: Oncology

## 2023-12-13 VITALS — BP 156/53 | HR 63 | Temp 98.1°F | Resp 14 | Ht 65.0 in | Wt 172.0 lb

## 2023-12-13 DIAGNOSIS — C8308 Small cell B-cell lymphoma, lymph nodes of multiple sites: Secondary | ICD-10-CM

## 2023-12-13 DIAGNOSIS — C8300 Small cell B-cell lymphoma, unspecified site: Secondary | ICD-10-CM | POA: Diagnosis not present

## 2023-12-13 NOTE — Telephone Encounter (Addendum)
 CT A/P has been scheduled for 12/19/23 @ 11:15 AM ; Checking in @ 10:45 AM at [facility].  Notified pt of date,time, instructions and location.

## 2023-12-20 ENCOUNTER — Inpatient Hospital Stay: Admitting: Oncology

## 2023-12-20 ENCOUNTER — Other Ambulatory Visit: Payer: Self-pay | Admitting: Oncology

## 2023-12-20 ENCOUNTER — Inpatient Hospital Stay

## 2023-12-20 ENCOUNTER — Telehealth: Payer: Self-pay | Admitting: Oncology

## 2023-12-20 DIAGNOSIS — D649 Anemia, unspecified: Secondary | ICD-10-CM

## 2023-12-20 DIAGNOSIS — C8300 Small cell B-cell lymphoma, unspecified site: Secondary | ICD-10-CM | POA: Diagnosis not present

## 2023-12-20 LAB — CBC WITH DIFFERENTIAL (CANCER CENTER ONLY)
Abs Immature Granulocytes: 0.01 K/uL (ref 0.00–0.07)
Basophils Absolute: 0 K/uL (ref 0.0–0.1)
Basophils Relative: 1 %
Eosinophils Absolute: 0.1 K/uL (ref 0.0–0.5)
Eosinophils Relative: 2 %
HCT: 38.3 % (ref 36.0–46.0)
Hemoglobin: 12.3 g/dL (ref 12.0–15.0)
Immature Granulocytes: 0 %
Lymphocytes Relative: 18 %
Lymphs Abs: 0.9 K/uL (ref 0.7–4.0)
MCH: 28.1 pg (ref 26.0–34.0)
MCHC: 32.1 g/dL (ref 30.0–36.0)
MCV: 87.6 fL (ref 80.0–100.0)
Monocytes Absolute: 0.4 K/uL (ref 0.1–1.0)
Monocytes Relative: 9 %
Neutro Abs: 3.5 K/uL (ref 1.7–7.7)
Neutrophils Relative %: 70 %
Platelet Count: 181 K/uL (ref 150–400)
RBC: 4.37 MIL/uL (ref 3.87–5.11)
RDW: 14 % (ref 11.5–15.5)
WBC Count: 5 K/uL (ref 4.0–10.5)
nRBC: 0 % (ref 0.0–0.2)

## 2023-12-20 LAB — IRON AND TIBC
Iron: 51 ug/dL (ref 28–170)
Saturation Ratios: 15 % (ref 10.4–31.8)
TIBC: 332 ug/dL (ref 250–450)
UIBC: 281 ug/dL

## 2023-12-20 LAB — FERRITIN: Ferritin: 128 ng/mL (ref 11–307)

## 2023-12-20 NOTE — Telephone Encounter (Signed)
 Patient has been scheduled for follow-up visit per 12/20/23 LOS.  Pt given an appt calendar with date and time.

## 2023-12-20 NOTE — Progress Notes (Signed)
 [facility] at [facility] [address] [place],  [place]  [address] [phone]  Clinic Day:  12/20/2023  Referring physician: Ofilia Lamar CROME, MD  HISTORY OF PRESENT ILLNESS:  The patient is a 78 y.o. 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  The patient comes in today to go over her CT scans of her abdomen/pelvis.  The studies were done due to her having an isolated right leg swelling.  Of note, she has had superficial venous stripping surgery done to this extremity.  However, with the isolated leg swelling, she was concerned about the potential there being bulky lymphadenopathy within her pelvic region that was leading to isolated right leg lymphedema.  Of note, recent lymphoma studies done showed that her disease remains under ideal control.  PHYSICAL EXAM:  Blood pressure (!) 152/65, pulse (!) 57, temperature 98.1 F (36.7 C), temperature source Oral, resp. rate 14, height 5' 5 (1.651 m), weight 170 lb (77.1 kg), SpO2 97%. Wt Readings from Last 3 Encounters:  12/20/23 170 lb (77.1 kg)  12/13/23 172 lb (78 kg)  06/10/23 170 lb (77.1 kg)   Body mass index is 28.29 kg/m. Performance status (ECOG): 1 - Symptomatic but completely ambulatory Physical Exam Constitutional:      Appearance: Normal appearance. She is not ill-appearing.  HENT:     Mouth/Throat:     Mouth: Mucous membranes are moist.     Pharynx: Oropharynx is clear. No oropharyngeal exudate or posterior oropharyngeal erythema.  Cardiovascular:     Rate and Rhythm: Normal rate and regular rhythm.     Heart sounds: No murmur heard.    No friction rub. No gallop.  Pulmonary:     Effort: Pulmonary effort is normal. No respiratory distress.     Breath sounds: Normal breath sounds. No wheezing, rhonchi or rales.  Abdominal:     General: Bowel sounds are normal. There is no distension.     Palpations: Abdomen is soft. There is no mass.      Tenderness: There is no abdominal tenderness.  Musculoskeletal:        General: No swelling.     Right lower leg: Swelling present. 1+ Edema present.     Left lower leg: No edema.  Lymphadenopathy:     Cervical: No cervical adenopathy.     Upper Body:     Right upper body: No supraclavicular or axillary adenopathy.     Left upper body: No supraclavicular or axillary adenopathy.     Lower Body: No right inguinal adenopathy. No left inguinal adenopathy.  Skin:    General: Skin is warm.     Coloration: Skin is not jaundiced.     Findings: No lesion or rash.  Neurological:     General: No focal deficit present.     Mental Status: She is alert and oriented to person, place, and time. Mental status is at baseline.  Psychiatric:        Mood and Affect: Mood normal.        Behavior: Behavior normal.        Thought Content: Thought content normal.   SCANS:  CT scans of her abdomen/pelvis on 12-19-23 revealed the following:  LABS:  Latest Reference Range & Units 12/20/23 11:46  WBC 4.0 - 10.5 K/uL 5.0  RBC 3.87 - 5.11 MIL/uL 4.37  Hemoglobin 12.0 - 15.0 g/dL 87.6  HCT 63.9 - 53.9 % 38.3  MCV 80.0 - 100.0  fL 87.6  MCH 26.0 - 34.0 pg 28.1  MCHC 30.0 - 36.0 g/dL 67.8  RDW 88.4 - 84.4 % 14.0  Platelets 150 - 400 K/uL 181  nRBC 0.0 - 0.2 % 0.0  Neutrophils % 70  Lymphocytes % 18  Monocytes Relative % 9  Eosinophil % 2  Basophil % 1  Immature Granulocytes % 0    Latest Reference Range & Units 12/20/23 11:46  Iron  28 - 170 ug/dL 51  UIBC ug/dL 718  TIBC 749 - 549 ug/dL 667  Saturation Ratios 10.4 - 31.8 % 15  Ferritin 11 - 307 ng/mL 128     Latest Reference Range & Units 10/04/22 10:03 02/03/23 09:56 06/03/23 10:34 12/06/23 10:06  M Protein SerPl Elph-Mcnc Not Observed g/dL 0.4 (H) (C) 0.4 (H) (C) 0.3 (H) (C) 0.3 (H) (C)  (H): Data is abnormally high (C): Corrected Her initial M protein level was 2.5 g  Latest Reference Range & Units 10/04/22 10:03 02/03/23 09:56 06/03/23  10:34 12/06/23 10:06  IgG (Immunoglobin G), Serum 586 - 1,602 mg/dL 123 175 315 290   Her initial IgG level was 3,255.   Latest Reference Range & Units 10/04/22 10:03 02/03/23 09:56 06/03/23 10:34 12/06/23 10:06  Kappa free light chain 3.3 - 19.4 mg/L 94.0 (H) 67.6 (H) 49.1 (H) 44.8 (H)  (H): Data is abnormally high Her initial kappa light chain level was 1430.5  ASSESSMENT & PLAN:  Assessment/Plan:  A 78 y.o. 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  In clinic today, I went over all of her CT scan images with her, for which she could see that she has no abnormal lymphadenopathy in her abdominal/pelvic regions to explain her isolated right leg lymphedema.  A previous Doppler ultrasound did not reveal a DVT within her right leg.  Based upon this, it appears her right leg swelling may be due to venous stasis issues.  I encouraged her to speak with her vein doctors about determining other ways her isolated right leg edema can be managed.  In passing, the patient complained of feeling cold.  A repeat CBC today showed a normal hemoglobin of 12.3.  Her iron  parameters show no evidence of iron  deficiency being present. Overall, from a hematologic standpoint, I do not sense anything particularly ominous.  The patient clinically looks fine.  Based upon this, I will see this patient back in 6 months to reassess the status of her small B-cell lymphoma.  The patient understands all the plans discussed today and is in agreement with them.    Osinachi Navarrette DELENA Kerns, MD

## 2023-12-29 ENCOUNTER — Ambulatory Visit

## 2023-12-29 VITALS — BP 132/54 | HR 61 | Ht 65.0 in | Wt 176.2 lb

## 2023-12-29 DIAGNOSIS — I2510 Atherosclerotic heart disease of native coronary artery without angina pectoris: Secondary | ICD-10-CM | POA: Diagnosis not present

## 2023-12-29 DIAGNOSIS — Z8673 Personal history of transient ischemic attack (TIA), and cerebral infarction without residual deficits: Secondary | ICD-10-CM | POA: Diagnosis not present

## 2023-12-29 DIAGNOSIS — E782 Mixed hyperlipidemia: Secondary | ICD-10-CM

## 2023-12-29 DIAGNOSIS — I10 Essential (primary) hypertension: Secondary | ICD-10-CM | POA: Diagnosis not present

## 2023-12-29 MED ORDER — ASPIRIN 81 MG PO TBEC: 81.0000 mg | DELAYED_RELEASE_TABLET | Freq: Every day | ORAL | Status: AC

## 2023-12-29 MED ORDER — LOSARTAN POTASSIUM 25 MG PO TABS: 25.0000 mg | ORAL_TABLET | Freq: Every day | ORAL | 3 refills | Status: DC

## 2023-12-29 NOTE — Assessment & Plan Note (Signed)
 Remains asymptomatic. Functional status however is limited due to back pain and osteoarthritis.  Encouraged to keep herself active and regularly exercise as tolerated.  Given her history of TIA and with coronary artery calcifications would recommend starting aspirin 81 mg once daily unless there is a major contraindication. On discussion with her as there is no absolute contraindication for aspirin at this time.  Start aspirin 81 mg once daily, she is agreeable. Continue rosuvastatin 40 mg once daily.

## 2023-12-29 NOTE — Assessment & Plan Note (Signed)
 Given history of TIA and coronary artery calcification would recommend starting aspirin 81 mg once daily. She denies any major side effects or abnormal reactions while on aspirin. Her main issue tends to be mild bruising.  Agreeable to take aspirin 81 mg once daily after discussing the risk benefits.

## 2023-12-29 NOTE — Assessment & Plan Note (Signed)
 Suboptimal control. Continue amlodipine  10 mg once daily Continue spironolactone  25 mg once daily. She previously was on losartan  100 mg once daily which was discontinued apparently due to concerns about drop in GFR.  Her blood work reviewed shows GFR to be relatively stable. Most recent CMP from 12/06/2023 BUN 15, creatinine 1.03, eGFR 56.  Would recommend resuming losartan  25 mg once daily for blood pressure optimization.  Repeat basic metabolic panel tentatively in 2 to 3 weeks to monitor renal function.

## 2023-12-29 NOTE — Progress Notes (Signed)
 Cardiology Consultation:    Date:  12/29/2023   ID:  Alisha Cochran Alisha Cochran Alisha Cochran, MRN [id number]  PCP:  Alisha Lamar CROME, MD  Cardiologist:  Alisha SAUNDERS Donalda Job, MD   Referring MD: Alisha Lamar CROME, MD   No chief complaint on file.    ASSESSMENT AND PLAN:   Alisha Cochran 78 year old woman  history of coronary artery calcification, hypertension, hyperlipidemia, CKD stage III, TIA [reports 2 episodes in 2012 and 1 episode in December 2023 1)], non-Hodgkin's B cell lymphoma s/p chemotherapy completed February 2023, chronic right lower extremity edema secondary to venous insufficiency, chronic back pain, echocardiogram May 2023 with normal biventricular function LVEF 60 to 65%, grade 1 diastolic dysfunction, mild TR.  Previous stress test with nuclear imaging April 2021 noted no ischemia.  May 2022 bilateral normal ankle-brachial indicis with mildly abnormal toe brachial indicis [right 0.68 and left 0.67].   Here for routine follow-up visit. Blood pressures remain uncontrolled, currently not taking aspirin.  Problem List Items Addressed This Visit     Essential hypertension   Suboptimal control. Continue amlodipine  10 mg once daily Continue spironolactone  25 mg once daily. She previously was on losartan  100 mg once daily which was discontinued apparently due to concerns about drop in GFR.  Her blood work reviewed shows GFR to be relatively stable. Most recent CMP from 12/06/2023 BUN 15, creatinine 1.03, eGFR 56.  Would recommend resuming losartan  25 mg once daily for blood pressure optimization.  Repeat basic metabolic panel tentatively in 2 to 3 weeks to monitor renal function.       Relevant Medications   losartan  (COZAAR ) 25 MG tablet   aspirin EC 81 MG tablet   Other Relevant Orders   Basic metabolic panel with GFR   History of TIA (transient ischemic attack)   Given history of TIA and coronary artery calcification would recommend starting aspirin 81 mg once  daily. She denies any major side effects or abnormal reactions while on aspirin. Her main issue tends to be mild bruising.  Agreeable to take aspirin 81 mg once daily after discussing the risk benefits.      Mixed dyslipidemia   Target LDL below 70 mg/dL. Recent lipid panel Jul 15, 2023 well-controlled with HDL 65, LDL 47, triglycerides 64 and total cholesterol 126.  Continue rosuvastatin 40 mg once daily.      Relevant Orders   EKG 12-Lead (Completed)   Coronary artery calcification - Primary   Remains asymptomatic. Functional status however is limited due to back pain and osteoarthritis.  Encouraged to keep herself active and regularly exercise as tolerated.  Given her history of TIA and with coronary artery calcifications would recommend starting aspirin 81 mg once daily unless there is a major contraindication. On discussion with her as there is no absolute contraindication for aspirin at this time.  Start aspirin 81 mg once daily, she is agreeable. Continue rosuvastatin 40 mg once daily.      Relevant Medications   losartan  (COZAAR ) 25 MG tablet   aspirin EC 81 MG tablet   Return to clinic tentatively in 3 months   History of Present Illness:    Alisha Cochran is a 78 y.o. female who is being seen today for follow-up visit. PCP is Dough, Lamar CROME, MD. Last visit with us  in the office was 06/09/2022 with Dr. Edwyna.  Has history of coronary artery calcification, hypertension, hyperlipidemia, CKD stage III, TIA [reports 2 episodes in 2012 and 1 episode in December  2023 1)], non-Hodgkin's B cell lymphoma s/p chemotherapy completed February 2023, chronic right lower extremity edema secondary to venous insufficiency, chronic back pain, echocardiogram May 2023 with normal biventricular function LVEF 60 to 65%, grade 1 diastolic dysfunction, mild TR.  Previous stress test with nuclear imaging April 2021 noted no ischemia.  May 2022 bilateral normal ankle-brachial  indicis with mildly abnormal toe brachial indicis [right 0.68 and left 0.67].  Pleasant woman here for the visit today by herself.  Lives with her husband at home. Does not exercise regularly but keeps herself busy with day-to-day activities at home.  Functional status somewhat limited due to osteoarthritis.  Denies any chest pain, shortness of breath, orthopnea, paroxysmal nocturnal dyspnea. No palpitations, lightheadedness, dizziness or syncopal episodes. No blood in urine or stools. Has chronic right lower extremity edema unchanged.  Wears compression sock.  Blood pressures at home suboptimal.  Log over the past month reviewed consistent with systolic 130s to 849 mmHg.  Heart rate in 50s to 60s. Currently on amlodipine  and spironolactone  for blood pressure control. Previously was on losartan  [dose appears to have been 100 mg] this was discontinued due to concerns about renal function.  Tells me that she has not been taking aspirin as she was asked to stop due to concerns about bruising.  She was unable to tell to requested her to discontinue this. She denies any significant concerns about major bleeding, normal major peptic ulcer disease concerns.  No allergy reaction.  EKG in the clinic today shows sinus rhythm heart rate 61/min, PR interval 172 ms, QRS duration 78 ms, QTc 408 ms, nonspecific ST-T changes  Lipid panel from 07/15/2023 total cholesterol 126, triglycerides 64, HDL 65, LDL 47.  Well-controlled.  Recent CBC 12/20/2023 with hemoglobin 12.3, hematocrit 38.3, platelets 181 and WBC 5. CMP from 12/06/2023 sodium 141, potassium 4, BUN 15, creatinine 1.03, eGFR 56.   Past Medical History:  Diagnosis Date   Acute pain of right knee 09/04/2019   Formatting of this note might be different from the original. 2021   Allergic rhinitis 09/15/2015   Anemia 11/16/2020   Anemia, iron  deficiency 07/21/2015   Antineoplastic chemotherapy induced anemia 12/26/2020   Arthropathy 09/15/2015    Atherosclerotic vascular disease 03/16/2021   B12 deficiency 01/13/2022   Formatting of this note might be different from the original. Initially GI, then H/O   Benign hypertension 07/21/2015   Bronchitis 07/13/2021   Formatting of this note might be different from the original. 07/13/2021   Bruising 07/04/2020   Formatting of this note might be different from the original. 07/04/2020: arms   Chronic fatigue 07/13/2021   Formatting of this note might be different from the original. 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 2014: MRI DDD L4/5   Diverticulosis of colon 09/15/2015   2014, 2020: diverticulitis   Dyspnea on exertion 10/25/2018   2020   Essential hypertension 05/08/2019   Family history of premature coronary artery disease 04/12/2018   Fibrocystic breast disease 09/15/2015   Herpes zoster without complication 01/15/2019   2020: left T12   History of TIA (transient ischemic attack) 07/23/2020   Insomnia 05/21/2015   Intermittent claudication 07/14/2020   Lumbar radiculopathy 09/25/2012   Overview:  2008: onset 2009: MRI DDD with tear  and protrusion L5-S1, facet arthropathy L5-S1 2014: MRI L4-5 DDD with progression of L5-S1 changes 2014: Pain eval, limited  Formatting of this note might be different from the original. 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 2008: -0.7 2016: -1.6 2019: -1.5  Formatting of this note might be different from the original. 2008: -0.7 2016: -1.6 2019: -1.5 2021: -1.6   Small cell B-cell lymphoma (HCC) 12/01/2020   Formatting of this note might be different from the original. 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   aspirin EC 81 MG tablet Take 1 tablet (81 mg total) by mouth daily. Swallow whole.   losartan  (COZAAR ) 25 MG tablet Take 1 tablet (25 mg total) by mouth daily.   omeprazole (PRILOSEC OTC) 20 MG tablet Take 20 mg by mouth as needed (heartburn or indigestion).   pregabalin (LYRICA) 75 MG capsule Take 75 mg by mouth as needed (pain).   rosuvastatin (CRESTOR) 40 MG tablet Take 40 mg by mouth daily.   spironolactone  (ALDACTONE ) 25 MG  tablet Take 25 mg by mouth daily.   zolpidem (AMBIEN) 5 MG tablet Take 5 mg by mouth at bedtime as needed.     Allergies:   Lisinopril and Losartan    Social History   Socioeconomic History   Marital status: Married    Spouse name: Not on file   Number of children: 2   Years of education: Not on file   Highest education level: Not on file  Occupational History   Occupation: retired geophysicist/field seismologist  Tobacco Use   Smoking status: Former    Current packs/day: 0.00    Types: Cigarettes    Start date: 1966    Quit date: 03/01/1978    Years since quitting: 45.8   Smokeless tobacco: Never  Substance and Sexual Activity   Alcohol use:  Yes    Alcohol/week: 14.0 standard drinks of alcohol    Types: 14 Glasses of wine per week    Comment: with diinner daily for 50 years   Drug use: Never   Sexual activity: Not on file  Other Topics Concern   Not on file  Social History Narrative   Not on file   Social Drivers of Health   Financial Resource Strain: Not on file  Food Insecurity: Low Risk  (07/15/2023)   Received from [facility]   Hunger Vital Sign    Within the past 12 months, you worried that your food would run out before you got money to buy more: Never true    Within the past 12 months, the food you bought just didn't last and you didn't have money to get more. : Never true  Transportation Needs: No Transportation Needs (07/15/2023)   Received from [facility]    In the past 12 months, has lack of reliable transportation kept you from medical appointments, meetings, work or from getting things needed for daily living? : No  Physical Activity: Not on file  Stress: Not on file  Social Connections: Not on file     Family History: The patient's family history includes Breast cancer in her maternal grandmother, mother, and paternal grandmother; Heart attack in her paternal grandfather and paternal grandmother; Heart disease in her father. ROS:   Please  see the history of present illness.    All 14 point review of systems negative except as described per history of present illness.  EKGs/Labs/Other Studies Reviewed:    The following studies were reviewed today:   EKG:  EKG Interpretation Date/Time:  Thursday December 29 2023 13:55:53 EDT Ventricular Rate:  61 PR Interval:  172 QRS Duration:  78 QT Interval:  406 QTC Calculation: 408 R Axis:   30  Text Interpretation: Normal sinus rhythm Low voltage QRS Borderline ECG No previous ECGs available Confirmed by Liborio Hai reddy [phone]) on 12/29/2023 2:18:07 PM    Recent Labs: 12/06/2023: ALT 11; BUN 15; Creatinine 1.03; Potassium 4.0; Sodium 141 12/20/2023: Hemoglobin 12.3; Platelet Count 181  Recent Lipid Panel No results found for: CHOL, TRIG, HDL, CHOLHDL, VLDL, LDLCALC, LDLDIRECT  Physical Exam:    VS:  BP (!) 132/54   Pulse 61   Ht 5' 5 (1.651 m)   Wt 176 lb 3.2 oz (79.9 kg)   SpO2 99%   BMI 29.32 kg/m     Wt Readings from Last 3 Encounters:  12/29/23 176 lb 3.2 oz (79.9 kg)  12/20/23 170 lb (77.1 kg)  12/13/23 172 lb (78 kg)     GENERAL:  Well nourished, well developed in no acute distress NECK: No JVD; No carotid bruits CARDIAC: RRR, S1 and S2 present, no murmurs, no rubs, no gallops CHEST:  Clear to auscultation without rales, wheezing or rhonchi  Extremities: No pitting pedal edema. Pulses bilaterally symmetric with radial 2+ and dorsalis pedis 2+ NEUROLOGIC:  Alert and oriented x 3  Medication Adjustments/Labs and Tests Ordered: Current medicines are reviewed at length with the patient today.  Concerns regarding medicines are outlined above.  Orders Placed This Encounter  Procedures   Basic metabolic panel with GFR   EKG [id number]   Meds ordered this encounter  Medications   losartan  (COZAAR ) 25 MG tablet    Sig: Take 1 tablet (25 mg total) by mouth daily.    Dispense:  90 tablet    Refill:  3  aspirin EC 81 MG tablet    Sig:  Take 1 tablet (81 mg total) by mouth daily. Swallow whole.    Signed, Avonte Sensabaugh reddy Aanika Defoor, MD, MPH, [facility]. 12/29/2023 2:53 PM     [facility]

## 2023-12-29 NOTE — Assessment & Plan Note (Signed)
 Target LDL below 70 mg/dL. Recent lipid panel Jul 15, 2023 well-controlled with HDL 65, LDL 47, triglycerides 64 and total cholesterol 126.  Continue rosuvastatin 40 mg once daily.

## 2023-12-29 NOTE — Patient Instructions (Signed)
 Blood Pressure Record Sheet To take your blood pressure, you will need a blood pressure machine. You can buy a blood pressure machine (blood pressure monitor) at your clinic, drug store, or online. When choosing one, consider: An automatic monitor that has an arm cuff. A cuff that wraps snugly around your upper arm. You should be able to fit only one finger between your arm and the cuff. A device that stores blood pressure reading results. Do not choose a monitor that measures your blood pressure from your wrist or finger. Follow your health care provider's instructions for how to take your blood pressure. To use this form: Get one reading in the morning (a.m.) 1-2 hours after you take any medicines. Get one reading in the evening (p.m.) before supper.   Blood pressure log Date: _______________________  a.m. _____________________(1st reading) HR___________            p.m. _____________________(2nd reading) HR__________  Date: _______________________  a.m. _____________________(1st reading) HR___________            p.m. _____________________(2nd reading) HR__________  Date: _______________________  a.m. _____________________(1st reading) HR___________            p.m. _____________________(2nd reading) HR__________  Date: _______________________  a.m. _____________________(1st reading) HR___________            p.m. _____________________(2nd reading) HR__________  Date: _______________________  a.m. _____________________(1st reading) HR___________            p.m. _____________________(2nd reading) HR__________  Date: _______________________  a.m. _____________________(1st reading) HR___________            p.m. _____________________(2nd reading) HR__________  Date: _______________________  a.m. _____________________(1st reading) HR___________            p.m. _____________________(2nd reading) HR__________   This information is not intended to replace advice  given to you by your health care provider. Make sure you discuss any questions you have with your health care provider. Document Revised: 06/06/2019 Document Reviewed: 06/06/2019 Elsevier Patient Education  2021 [employer]   Medication Instructions:  Your physician has recommended you make the following change in your medication:   Start taking 81 mg coated aspirin daily  Start Losartan  25 mg daily   *If you need a refill on your cardiac medications before your next appointment, please call your pharmacy*   Lab Work: Your physician recommends that you return for lab work in: 2-3 weeks for BMP You can come Monday through Friday 8:30 am to 12:00 pm and 1:15 to 4:30. You do not need to make an appointment as the order has already been placed.    If you have labs (blood work) drawn today and your tests are completely normal, you will receive your results only by: MyChart Message (if you have MyChart) OR A paper copy in the mail If you have any lab test that is abnormal or we need to change your treatment, we will call you to review the results.   Testing/Procedures: None ordered   Follow-Up: At [facility], you and your health needs are our priority.  As part of our continuing mission to provide you with exceptional heart care, we have created designated Provider Care Teams.  These Care Teams include your primary Cardiologist (physician) and Advanced Practice Providers (APPs -  Physician Assistants and Nurse Practitioners) who all work together to provide you with the care you need, when you need it.  We recommend signing up for the patient portal called MyChart.  Sign up information is provided on this  After Visit Summary.  MyChart is used to connect with patients for Virtual Visits (Telemedicine).  Patients are able to view lab/test results, encounter notes, upcoming appointments, etc.  Non-urgent messages can be sent to your provider as well.   To learn more about  what you can do with MyChart, go to [email or web address].    Your next appointment:   3 month(s)  The format for your next appointment:   In Person  Provider:   Jennifer Crape, MD    Other Instructions none  Important Information About Sugar

## 2024-01-02 ENCOUNTER — Other Ambulatory Visit: Payer: Self-pay

## 2024-01-03 ENCOUNTER — Telehealth: Payer: Self-pay

## 2024-01-03 ENCOUNTER — Encounter: Payer: Self-pay | Admitting: Oncology

## 2024-01-03 MED ORDER — LOSARTAN POTASSIUM 25 MG PO TABS: 25.0000 mg | ORAL_TABLET | Freq: Every day | ORAL | 3 refills | Status: AC

## 2024-01-03 NOTE — Telephone Encounter (Signed)
 Pt's medication was sent to pt's pharmacy as requested. Confirmation received.

## 2024-01-03 NOTE — Telephone Encounter (Signed)
*  STAT* If patient is at the pharmacy, call can be transferred to refill team.   1. Which medications need to be refilled? (please list name of each medication and dose if known) losartan  (COZAAR ) 25 MG tablet   2. Which pharmacy/location (including street and city if local pharmacy) is medication to be sent to? [facility] [phone] - [place], [place] - [address] AT [address] OF [address]   3. Do they need a 30 day or 90 day supply? 90

## 2024-01-18 NOTE — Telephone Encounter (Signed)
 Result Communication  Resulted Orders  POC Glucose (Hemocue/NOVA)  Result Value Ref Range   Glucose, POC 121 70 - 125 mg/dL   Kit/Device Lot # [id number]    Kit/Device Expiration Date 06/2025   POC Hemoglobin A1c  Result Value Ref Range   Hemoglobin A1c (POC) 5.1 4.2 - 5.6 %   A1C Information      A1C Diagnostic Criteria: Prediabetes:5.7% - 6.4% Diabetes:>=6.5% A1C Glycemic Goals: Older Adults: <7.0% Children and Adolescents: <7.0% Pregnant Women: <6.0%   Kit/Device Lot # [id number]    Kit/Device Expiration Date 07/2025   TSH  Result Value Ref Range   TSH 1.528 0.450 - 5.330 uIU/mL   Narrative   Female Patients >12 years only:  Pregnant Females, 1st Trimester: 0.050 - 3.700 IU/mL  Pregnant Females, 2nd Trimester: 0.310 - 4.350 IU/mL  Pregnant Females, 3rd Trimester: 0.410 - 5.180 IU/mL  Comprehensive Metabolic Panel  Result Value Ref Range   Sodium 143 136 - 145 mmol/L   Potassium 4.4 3.5 - 5.1 mmol/L     Comment:     NO VISIBLE HEMOLYSIS   Chloride 108 (H) 98 - 107 mmol/L   CO2 28 21 - 31 mmol/L     Comment:     High lactate dehydrogenase (LDH) concentrations in patient samples may cause falsely increased bicarbonate results. If markedly elevated LDH levels are suspected, please assess results in conjunction with patient's LDH values.   Anion Gap 7 6 - 14 mmol/L   Glucose, Random 114 (H) 70 - 99 mg/dL   Blood Urea Nitrogen (BUN) 16 7 - 25 mg/dL   Creatinine 8.97 9.39 - 1.20 mg/dL   eGFR 57 (L) >40 fO/fpw/8.26f7     Comment:     GFR estimated by CKD-EPI equations(NKF 2021).   Recommend confirmation of Cr-based eGFR by using Cys-based eGFR and other filtration markers (if applicable) in complex cases and clinical decision-making, as needed.   Albumin 4.3 3.5 - 5.7 g/dL   Total Protein 6.3 (L) 6.4 - 8.9 g/dL   Bilirubin, Total 1.0 0.3 - 1.0 mg/dL   Alkaline Phosphatase (ALP) 73 34 - 104 U/L   Aspartate Aminotransferase (AST) 15 13 - 39 U/L   Alanine Aminotransferase  (ALT) 10 7 - 52 U/L   Calcium 9.5 8.6 - 10.3 mg/dL   BUN/Creatinine Ratio       Comment:     Creatinine is normal, ratio is not clinically indicated.   Lipid Panel With Reflex Direct LDL  Result Value Ref Range   Cholesterol, Total, Lipid Panel 106 <200 mg/dL     Comment:                  Optimal: <200 mg/dL Borderline High Risk:  200-239 mg/dL            High Risk: >760 mg/dL   Triglycerides, Lipid Panel 95 <150 mg/dL     Comment:                  Optimal: <150 mg/dL Borderline High Risk:  150-199 mg/dL            High Risk: >800 mg/dL   HDL Cholesterol - Lipid Panel 51 >50 mg/dL     Comment:                  Optimal: >60 mg/dL Borderline High Risk:  40-60 mg/dL            High Risk: <59 mg/dL   LDL Cholesterol,  Calculated 37 <100 mg/dL     Comment:                  Optimal: <100 mg/dL         Near Optimal:  100-129 Borderline High Risk:  130-159 mg/dL            High Risk:  160-189 mg/dL       Very High Risk: >810 mg/dL   Non-HDL Cholesterol 55 <130 mg/dL     Comment:                  Optimal: <130 mg/dL Borderline High Risk:  130-160 mg/dL            High Risk: >839 mg/dL    0:43 PM   R

## 2024-01-19 LAB — BASIC METABOLIC PANEL WITH GFR
BUN/Creatinine Ratio: 15 (ref 12–28)
BUN: 16 mg/dL (ref 8–27)
CO2: 23 mmol/L (ref 20–29)
Calcium: 9.8 mg/dL (ref 8.7–10.3)
Chloride: 104 mmol/L (ref 96–106)
Creatinine, Ser: 1.07 mg/dL — ABNORMAL HIGH (ref 0.57–1.00)
Glucose: 109 mg/dL — ABNORMAL HIGH (ref 70–99)
Potassium: 4.3 mmol/L (ref 3.5–5.2)
Sodium: 141 mmol/L (ref 134–144)
eGFR: 54 mL/min/1.73 — ABNORMAL LOW (ref >59)

## 2024-03-30 ENCOUNTER — Ambulatory Visit

## 2024-04-09 ENCOUNTER — Ambulatory Visit

## 2024-06-12 ENCOUNTER — Inpatient Hospital Stay

## 2024-06-19 ENCOUNTER — Inpatient Hospital Stay: Admitting: Oncology
